# Patient Record
Sex: Female | Born: 1935 | Race: White | Hispanic: No | Smoking: Never smoker
Health system: Southern US, Community
[De-identification: ages and names within clinical notes are randomized; demographics above are authoritative.]

## PROBLEM LIST (undated history)

## (undated) DIAGNOSIS — J069 Acute upper respiratory infection, unspecified: Secondary | ICD-10-CM

## (undated) DIAGNOSIS — R413 Other amnesia: Secondary | ICD-10-CM

## (undated) DIAGNOSIS — I10 Essential (primary) hypertension: Secondary | ICD-10-CM

## (undated) DIAGNOSIS — D649 Anemia, unspecified: Secondary | ICD-10-CM

## (undated) DIAGNOSIS — M1990 Unspecified osteoarthritis, unspecified site: Secondary | ICD-10-CM

## (undated) DIAGNOSIS — J189 Pneumonia, unspecified organism: Secondary | ICD-10-CM

## (undated) DIAGNOSIS — F0390 Unspecified dementia without behavioral disturbance: Secondary | ICD-10-CM

## (undated) HISTORY — DX: Acute upper respiratory infection, unspecified: J06.9

## (undated) HISTORY — PX: OTHER SURGICAL HISTORY: SHX169

## (undated) HISTORY — DX: Other amnesia: R41.3

## (undated) HISTORY — DX: Essential (primary) hypertension: I10

## (undated) HISTORY — DX: Anemia, unspecified: D64.9

---

## 2005-04-25 ENCOUNTER — Emergency Department (HOSPITAL_COMMUNITY): Admission: EM | Admit: 2005-04-25 | Discharge: 2005-04-25 | Payer: Self-pay | Admitting: Emergency Medicine

## 2010-07-15 ENCOUNTER — Encounter
Admission: RE | Admit: 2010-07-15 | Discharge: 2010-10-13 | Payer: Self-pay | Source: Home / Self Care | Admitting: Family Medicine

## 2011-03-09 ENCOUNTER — Ambulatory Visit: Payer: Medicare Other | Attending: Family Medicine | Admitting: Physical Therapy

## 2011-03-09 DIAGNOSIS — M6281 Muscle weakness (generalized): Secondary | ICD-10-CM | POA: Insufficient documentation

## 2011-03-09 DIAGNOSIS — IMO0001 Reserved for inherently not codable concepts without codable children: Secondary | ICD-10-CM | POA: Insufficient documentation

## 2011-03-09 DIAGNOSIS — R269 Unspecified abnormalities of gait and mobility: Secondary | ICD-10-CM | POA: Insufficient documentation

## 2011-03-09 DIAGNOSIS — R5381 Other malaise: Secondary | ICD-10-CM | POA: Insufficient documentation

## 2011-03-11 ENCOUNTER — Ambulatory Visit: Payer: Medicare Other | Admitting: Physical Therapy

## 2011-03-18 ENCOUNTER — Ambulatory Visit: Payer: Medicare Other | Admitting: Physical Therapy

## 2011-03-23 ENCOUNTER — Ambulatory Visit: Payer: Medicare Other | Admitting: Physical Therapy

## 2011-03-25 ENCOUNTER — Ambulatory Visit: Payer: Medicare Other | Admitting: Physical Therapy

## 2011-03-30 ENCOUNTER — Ambulatory Visit: Payer: Medicare Other | Admitting: Physical Therapy

## 2011-04-01 ENCOUNTER — Ambulatory Visit: Payer: Medicare Other | Admitting: Physical Therapy

## 2011-04-02 ENCOUNTER — Encounter: Payer: Self-pay | Admitting: Nurse Practitioner

## 2011-04-06 ENCOUNTER — Encounter: Payer: Self-pay | Admitting: Nurse Practitioner

## 2011-04-06 ENCOUNTER — Ambulatory Visit: Payer: Medicare Other | Admitting: Physical Therapy

## 2011-04-08 ENCOUNTER — Ambulatory Visit: Payer: Medicare Other | Admitting: Physical Therapy

## 2011-04-13 ENCOUNTER — Ambulatory Visit: Payer: Medicare Other | Attending: Family Medicine | Admitting: Physical Therapy

## 2011-04-13 DIAGNOSIS — M6281 Muscle weakness (generalized): Secondary | ICD-10-CM | POA: Insufficient documentation

## 2011-04-13 DIAGNOSIS — IMO0001 Reserved for inherently not codable concepts without codable children: Secondary | ICD-10-CM | POA: Insufficient documentation

## 2011-04-13 DIAGNOSIS — R269 Unspecified abnormalities of gait and mobility: Secondary | ICD-10-CM | POA: Insufficient documentation

## 2011-04-13 DIAGNOSIS — R5381 Other malaise: Secondary | ICD-10-CM | POA: Insufficient documentation

## 2011-04-15 ENCOUNTER — Ambulatory Visit: Payer: Medicare Other | Admitting: Physical Therapy

## 2011-04-20 ENCOUNTER — Ambulatory Visit: Payer: Medicare Other | Admitting: Physical Therapy

## 2011-04-22 ENCOUNTER — Ambulatory Visit: Payer: Medicare Other | Admitting: Physical Therapy

## 2011-04-27 ENCOUNTER — Ambulatory Visit: Payer: Medicare Other | Admitting: Physical Therapy

## 2011-04-29 ENCOUNTER — Encounter: Payer: Medicare Other | Admitting: Physical Therapy

## 2011-05-04 ENCOUNTER — Ambulatory Visit: Payer: Medicare Other | Admitting: Physical Therapy

## 2011-05-06 ENCOUNTER — Encounter: Payer: Medicare Other | Admitting: Physical Therapy

## 2011-05-11 ENCOUNTER — Ambulatory Visit: Payer: Medicare Other | Attending: Family Medicine | Admitting: Physical Therapy

## 2011-05-11 DIAGNOSIS — IMO0001 Reserved for inherently not codable concepts without codable children: Secondary | ICD-10-CM | POA: Insufficient documentation

## 2011-05-11 DIAGNOSIS — M6281 Muscle weakness (generalized): Secondary | ICD-10-CM | POA: Insufficient documentation

## 2011-05-11 DIAGNOSIS — R269 Unspecified abnormalities of gait and mobility: Secondary | ICD-10-CM | POA: Insufficient documentation

## 2011-05-11 DIAGNOSIS — R5381 Other malaise: Secondary | ICD-10-CM | POA: Insufficient documentation

## 2011-05-13 ENCOUNTER — Ambulatory Visit: Payer: Medicare Other | Admitting: Physical Therapy

## 2011-05-18 ENCOUNTER — Ambulatory Visit: Payer: Medicare Other | Admitting: *Deleted

## 2011-05-25 ENCOUNTER — Encounter: Payer: Medicare Other | Admitting: Physical Therapy

## 2011-05-27 ENCOUNTER — Ambulatory Visit: Payer: Medicare Other | Admitting: Physical Therapy

## 2011-06-01 ENCOUNTER — Ambulatory Visit: Payer: Medicare Other | Admitting: Physical Therapy

## 2011-06-08 ENCOUNTER — Ambulatory Visit: Payer: Medicare Other | Attending: Family Medicine | Admitting: Physical Therapy

## 2011-06-08 DIAGNOSIS — IMO0001 Reserved for inherently not codable concepts without codable children: Secondary | ICD-10-CM | POA: Insufficient documentation

## 2011-06-08 DIAGNOSIS — M6281 Muscle weakness (generalized): Secondary | ICD-10-CM | POA: Insufficient documentation

## 2011-06-08 DIAGNOSIS — R5381 Other malaise: Secondary | ICD-10-CM | POA: Insufficient documentation

## 2011-06-08 DIAGNOSIS — R269 Unspecified abnormalities of gait and mobility: Secondary | ICD-10-CM | POA: Insufficient documentation

## 2011-06-10 ENCOUNTER — Ambulatory Visit: Payer: Medicare Other | Attending: Family Medicine | Admitting: Physical Therapy

## 2011-06-10 DIAGNOSIS — R5381 Other malaise: Secondary | ICD-10-CM | POA: Insufficient documentation

## 2011-06-10 DIAGNOSIS — R269 Unspecified abnormalities of gait and mobility: Secondary | ICD-10-CM | POA: Insufficient documentation

## 2011-06-10 DIAGNOSIS — IMO0001 Reserved for inherently not codable concepts without codable children: Secondary | ICD-10-CM | POA: Insufficient documentation

## 2011-06-10 DIAGNOSIS — M6281 Muscle weakness (generalized): Secondary | ICD-10-CM | POA: Insufficient documentation

## 2011-06-15 ENCOUNTER — Ambulatory Visit: Payer: Medicare Other | Admitting: Physical Therapy

## 2011-06-17 ENCOUNTER — Ambulatory Visit: Payer: Medicare Other | Admitting: Physical Therapy

## 2011-06-21 ENCOUNTER — Ambulatory Visit: Payer: Medicare Other | Admitting: Physical Therapy

## 2011-06-23 ENCOUNTER — Ambulatory Visit: Payer: Medicare Other | Admitting: Physical Therapy

## 2011-06-28 ENCOUNTER — Ambulatory Visit: Payer: Medicare Other | Admitting: Physical Therapy

## 2011-07-01 ENCOUNTER — Ambulatory Visit: Payer: Medicare Other | Admitting: Physical Therapy

## 2011-07-05 ENCOUNTER — Ambulatory Visit: Payer: Medicare Other | Admitting: Physical Therapy

## 2011-07-08 ENCOUNTER — Ambulatory Visit: Payer: Medicare Other | Admitting: *Deleted

## 2011-11-26 DIAGNOSIS — T8690 Unspecified complication of unspecified transplanted organ and tissue: Secondary | ICD-10-CM | POA: Diagnosis not present

## 2011-11-26 DIAGNOSIS — H4050X0 Glaucoma secondary to other eye disorders, unspecified eye, stage unspecified: Secondary | ICD-10-CM | POA: Diagnosis not present

## 2011-11-26 DIAGNOSIS — Z09 Encounter for follow-up examination after completed treatment for conditions other than malignant neoplasm: Secondary | ICD-10-CM | POA: Diagnosis not present

## 2011-12-17 DIAGNOSIS — N390 Urinary tract infection, site not specified: Secondary | ICD-10-CM | POA: Diagnosis not present

## 2012-01-05 DIAGNOSIS — H4050X0 Glaucoma secondary to other eye disorders, unspecified eye, stage unspecified: Secondary | ICD-10-CM | POA: Diagnosis not present

## 2012-01-27 DIAGNOSIS — H4050X0 Glaucoma secondary to other eye disorders, unspecified eye, stage unspecified: Secondary | ICD-10-CM | POA: Diagnosis not present

## 2012-01-27 DIAGNOSIS — H409 Unspecified glaucoma: Secondary | ICD-10-CM | POA: Diagnosis not present

## 2012-01-27 DIAGNOSIS — Z9889 Other specified postprocedural states: Secondary | ICD-10-CM | POA: Diagnosis not present

## 2012-01-27 DIAGNOSIS — T8690 Unspecified complication of unspecified transplanted organ and tissue: Secondary | ICD-10-CM | POA: Diagnosis not present

## 2012-02-17 DIAGNOSIS — Z79899 Other long term (current) drug therapy: Secondary | ICD-10-CM | POA: Diagnosis not present

## 2012-02-17 DIAGNOSIS — Z9849 Cataract extraction status, unspecified eye: Secondary | ICD-10-CM | POA: Diagnosis not present

## 2012-02-17 DIAGNOSIS — R9431 Abnormal electrocardiogram [ECG] [EKG]: Secondary | ICD-10-CM | POA: Diagnosis not present

## 2012-02-17 DIAGNOSIS — I209 Angina pectoris, unspecified: Secondary | ICD-10-CM | POA: Diagnosis not present

## 2012-02-17 DIAGNOSIS — H4011X0 Primary open-angle glaucoma, stage unspecified: Secondary | ICD-10-CM | POA: Diagnosis not present

## 2012-02-17 DIAGNOSIS — Z881 Allergy status to other antibiotic agents status: Secondary | ICD-10-CM | POA: Diagnosis not present

## 2012-02-17 DIAGNOSIS — H409 Unspecified glaucoma: Secondary | ICD-10-CM | POA: Diagnosis not present

## 2012-02-17 DIAGNOSIS — H3530 Unspecified macular degeneration: Secondary | ICD-10-CM | POA: Diagnosis not present

## 2012-02-17 DIAGNOSIS — T85398A Other mechanical complication of other ocular prosthetic devices, implants and grafts, initial encounter: Secondary | ICD-10-CM | POA: Diagnosis not present

## 2012-02-22 DIAGNOSIS — I209 Angina pectoris, unspecified: Secondary | ICD-10-CM | POA: Diagnosis not present

## 2012-02-22 DIAGNOSIS — H409 Unspecified glaucoma: Secondary | ICD-10-CM | POA: Diagnosis not present

## 2012-02-22 DIAGNOSIS — Z881 Allergy status to other antibiotic agents status: Secondary | ICD-10-CM | POA: Diagnosis not present

## 2012-02-22 DIAGNOSIS — H3530 Unspecified macular degeneration: Secondary | ICD-10-CM | POA: Diagnosis not present

## 2012-02-22 DIAGNOSIS — T85398A Other mechanical complication of other ocular prosthetic devices, implants and grafts, initial encounter: Secondary | ICD-10-CM | POA: Diagnosis not present

## 2012-02-22 DIAGNOSIS — H4011X0 Primary open-angle glaucoma, stage unspecified: Secondary | ICD-10-CM | POA: Diagnosis not present

## 2012-02-23 DIAGNOSIS — Z881 Allergy status to other antibiotic agents status: Secondary | ICD-10-CM | POA: Diagnosis not present

## 2012-02-23 DIAGNOSIS — T85398A Other mechanical complication of other ocular prosthetic devices, implants and grafts, initial encounter: Secondary | ICD-10-CM | POA: Diagnosis not present

## 2012-02-23 DIAGNOSIS — H3530 Unspecified macular degeneration: Secondary | ICD-10-CM | POA: Diagnosis not present

## 2012-02-23 DIAGNOSIS — H409 Unspecified glaucoma: Secondary | ICD-10-CM | POA: Diagnosis not present

## 2012-02-23 DIAGNOSIS — I209 Angina pectoris, unspecified: Secondary | ICD-10-CM | POA: Diagnosis not present

## 2012-02-23 DIAGNOSIS — H4011X0 Primary open-angle glaucoma, stage unspecified: Secondary | ICD-10-CM | POA: Diagnosis not present

## 2012-03-02 DIAGNOSIS — N390 Urinary tract infection, site not specified: Secondary | ICD-10-CM | POA: Diagnosis not present

## 2012-03-08 DIAGNOSIS — H4050X0 Glaucoma secondary to other eye disorders, unspecified eye, stage unspecified: Secondary | ICD-10-CM | POA: Diagnosis not present

## 2012-03-22 DIAGNOSIS — I7090 Unspecified atherosclerosis: Secondary | ICD-10-CM | POA: Diagnosis not present

## 2012-03-22 DIAGNOSIS — H4050X0 Glaucoma secondary to other eye disorders, unspecified eye, stage unspecified: Secondary | ICD-10-CM | POA: Diagnosis not present

## 2012-03-22 DIAGNOSIS — I10 Essential (primary) hypertension: Secondary | ICD-10-CM | POA: Diagnosis not present

## 2012-04-26 DIAGNOSIS — H4050X0 Glaucoma secondary to other eye disorders, unspecified eye, stage unspecified: Secondary | ICD-10-CM | POA: Diagnosis not present

## 2012-04-28 DIAGNOSIS — N390 Urinary tract infection, site not specified: Secondary | ICD-10-CM | POA: Diagnosis not present

## 2012-04-28 DIAGNOSIS — E785 Hyperlipidemia, unspecified: Secondary | ICD-10-CM | POA: Diagnosis not present

## 2012-04-28 DIAGNOSIS — I10 Essential (primary) hypertension: Secondary | ICD-10-CM | POA: Diagnosis not present

## 2012-05-15 DIAGNOSIS — N390 Urinary tract infection, site not specified: Secondary | ICD-10-CM | POA: Diagnosis not present

## 2012-07-14 DIAGNOSIS — T85398A Other mechanical complication of other ocular prosthetic devices, implants and grafts, initial encounter: Secondary | ICD-10-CM | POA: Diagnosis not present

## 2012-07-14 DIAGNOSIS — Z947 Corneal transplant status: Secondary | ICD-10-CM | POA: Diagnosis not present

## 2012-07-31 DIAGNOSIS — N390 Urinary tract infection, site not specified: Secondary | ICD-10-CM | POA: Diagnosis not present

## 2012-07-31 DIAGNOSIS — S20219A Contusion of unspecified front wall of thorax, initial encounter: Secondary | ICD-10-CM | POA: Diagnosis not present

## 2012-07-31 DIAGNOSIS — S2239XA Fracture of one rib, unspecified side, initial encounter for closed fracture: Secondary | ICD-10-CM | POA: Diagnosis not present

## 2012-08-07 DIAGNOSIS — S2239XA Fracture of one rib, unspecified side, initial encounter for closed fracture: Secondary | ICD-10-CM | POA: Diagnosis not present

## 2012-08-08 DIAGNOSIS — M25539 Pain in unspecified wrist: Secondary | ICD-10-CM | POA: Diagnosis not present

## 2012-08-08 DIAGNOSIS — S59909A Unspecified injury of unspecified elbow, initial encounter: Secondary | ICD-10-CM | POA: Diagnosis not present

## 2012-08-08 DIAGNOSIS — M25439 Effusion, unspecified wrist: Secondary | ICD-10-CM | POA: Diagnosis not present

## 2012-08-08 DIAGNOSIS — M19039 Primary osteoarthritis, unspecified wrist: Secondary | ICD-10-CM | POA: Diagnosis not present

## 2012-08-08 DIAGNOSIS — S6990XA Unspecified injury of unspecified wrist, hand and finger(s), initial encounter: Secondary | ICD-10-CM | POA: Diagnosis not present

## 2012-08-08 DIAGNOSIS — R4182 Altered mental status, unspecified: Secondary | ICD-10-CM | POA: Diagnosis not present

## 2012-08-08 DIAGNOSIS — M11239 Other chondrocalcinosis, unspecified wrist: Secondary | ICD-10-CM | POA: Diagnosis not present

## 2012-08-08 DIAGNOSIS — M948X9 Other specified disorders of cartilage, unspecified sites: Secondary | ICD-10-CM | POA: Diagnosis not present

## 2012-08-08 DIAGNOSIS — S0990XA Unspecified injury of head, initial encounter: Secondary | ICD-10-CM | POA: Diagnosis not present

## 2012-08-10 DIAGNOSIS — M19039 Primary osteoarthritis, unspecified wrist: Secondary | ICD-10-CM | POA: Diagnosis not present

## 2012-09-04 DIAGNOSIS — N390 Urinary tract infection, site not specified: Secondary | ICD-10-CM | POA: Diagnosis not present

## 2012-09-04 DIAGNOSIS — I10 Essential (primary) hypertension: Secondary | ICD-10-CM | POA: Diagnosis not present

## 2012-09-04 DIAGNOSIS — S2239XA Fracture of one rib, unspecified side, initial encounter for closed fracture: Secondary | ICD-10-CM | POA: Diagnosis not present

## 2012-09-04 DIAGNOSIS — E785 Hyperlipidemia, unspecified: Secondary | ICD-10-CM | POA: Diagnosis not present

## 2012-09-04 DIAGNOSIS — Z23 Encounter for immunization: Secondary | ICD-10-CM | POA: Diagnosis not present

## 2012-09-06 DIAGNOSIS — M19039 Primary osteoarthritis, unspecified wrist: Secondary | ICD-10-CM | POA: Diagnosis not present

## 2012-09-07 DIAGNOSIS — H905 Unspecified sensorineural hearing loss: Secondary | ICD-10-CM | POA: Diagnosis not present

## 2012-10-26 DIAGNOSIS — H4050X0 Glaucoma secondary to other eye disorders, unspecified eye, stage unspecified: Secondary | ICD-10-CM | POA: Diagnosis not present

## 2012-10-26 DIAGNOSIS — T8690 Unspecified complication of unspecified transplanted organ and tissue: Secondary | ICD-10-CM | POA: Diagnosis not present

## 2012-12-01 DIAGNOSIS — R3919 Other difficulties with micturition: Secondary | ICD-10-CM | POA: Diagnosis not present

## 2012-12-01 DIAGNOSIS — F02818 Dementia in other diseases classified elsewhere, unspecified severity, with other behavioral disturbance: Secondary | ICD-10-CM | POA: Diagnosis not present

## 2012-12-01 DIAGNOSIS — R269 Unspecified abnormalities of gait and mobility: Secondary | ICD-10-CM | POA: Diagnosis not present

## 2012-12-01 DIAGNOSIS — I10 Essential (primary) hypertension: Secondary | ICD-10-CM | POA: Diagnosis not present

## 2012-12-12 ENCOUNTER — Ambulatory Visit: Payer: Medicare Other | Attending: Family Medicine | Admitting: Physical Therapy

## 2012-12-12 DIAGNOSIS — R269 Unspecified abnormalities of gait and mobility: Secondary | ICD-10-CM | POA: Insufficient documentation

## 2012-12-12 DIAGNOSIS — R279 Unspecified lack of coordination: Secondary | ICD-10-CM | POA: Diagnosis not present

## 2012-12-12 DIAGNOSIS — IMO0001 Reserved for inherently not codable concepts without codable children: Secondary | ICD-10-CM | POA: Insufficient documentation

## 2012-12-20 ENCOUNTER — Ambulatory Visit: Payer: Medicare Other | Admitting: Physical Therapy

## 2012-12-20 DIAGNOSIS — IMO0001 Reserved for inherently not codable concepts without codable children: Secondary | ICD-10-CM | POA: Diagnosis not present

## 2012-12-20 DIAGNOSIS — R269 Unspecified abnormalities of gait and mobility: Secondary | ICD-10-CM | POA: Diagnosis not present

## 2012-12-20 DIAGNOSIS — R279 Unspecified lack of coordination: Secondary | ICD-10-CM | POA: Diagnosis not present

## 2012-12-26 ENCOUNTER — Ambulatory Visit: Payer: Medicare Other | Admitting: Physical Therapy

## 2012-12-26 DIAGNOSIS — R279 Unspecified lack of coordination: Secondary | ICD-10-CM | POA: Diagnosis not present

## 2012-12-26 DIAGNOSIS — IMO0001 Reserved for inherently not codable concepts without codable children: Secondary | ICD-10-CM | POA: Diagnosis not present

## 2012-12-26 DIAGNOSIS — R269 Unspecified abnormalities of gait and mobility: Secondary | ICD-10-CM | POA: Diagnosis not present

## 2012-12-27 ENCOUNTER — Encounter: Payer: Medicare Other | Admitting: Physical Therapy

## 2013-01-03 ENCOUNTER — Ambulatory Visit: Payer: Medicare Other | Admitting: Physical Therapy

## 2013-01-03 DIAGNOSIS — IMO0001 Reserved for inherently not codable concepts without codable children: Secondary | ICD-10-CM | POA: Diagnosis not present

## 2013-01-03 DIAGNOSIS — R269 Unspecified abnormalities of gait and mobility: Secondary | ICD-10-CM | POA: Diagnosis not present

## 2013-01-03 DIAGNOSIS — R279 Unspecified lack of coordination: Secondary | ICD-10-CM | POA: Diagnosis not present

## 2013-01-09 ENCOUNTER — Encounter: Payer: Medicare Other | Admitting: Physical Therapy

## 2013-01-11 ENCOUNTER — Ambulatory Visit: Payer: Medicare Other | Attending: Family Medicine | Admitting: Physical Therapy

## 2013-01-11 DIAGNOSIS — R279 Unspecified lack of coordination: Secondary | ICD-10-CM | POA: Diagnosis not present

## 2013-01-11 DIAGNOSIS — R269 Unspecified abnormalities of gait and mobility: Secondary | ICD-10-CM | POA: Diagnosis not present

## 2013-01-11 DIAGNOSIS — IMO0001 Reserved for inherently not codable concepts without codable children: Secondary | ICD-10-CM | POA: Diagnosis not present

## 2013-01-17 ENCOUNTER — Ambulatory Visit: Payer: Medicare Other | Admitting: Physical Therapy

## 2013-01-17 DIAGNOSIS — R269 Unspecified abnormalities of gait and mobility: Secondary | ICD-10-CM | POA: Diagnosis not present

## 2013-01-17 DIAGNOSIS — R279 Unspecified lack of coordination: Secondary | ICD-10-CM | POA: Diagnosis not present

## 2013-01-17 DIAGNOSIS — IMO0001 Reserved for inherently not codable concepts without codable children: Secondary | ICD-10-CM | POA: Diagnosis not present

## 2013-01-22 ENCOUNTER — Encounter: Payer: Medicare Other | Admitting: Physical Therapy

## 2013-01-23 ENCOUNTER — Encounter: Payer: Medicare Other | Admitting: Physical Therapy

## 2013-01-24 ENCOUNTER — Ambulatory Visit: Payer: Medicare Other | Admitting: Physical Therapy

## 2013-01-24 DIAGNOSIS — R279 Unspecified lack of coordination: Secondary | ICD-10-CM | POA: Diagnosis not present

## 2013-01-24 DIAGNOSIS — IMO0001 Reserved for inherently not codable concepts without codable children: Secondary | ICD-10-CM | POA: Diagnosis not present

## 2013-01-24 DIAGNOSIS — R269 Unspecified abnormalities of gait and mobility: Secondary | ICD-10-CM | POA: Diagnosis not present

## 2013-01-25 DIAGNOSIS — H4050X0 Glaucoma secondary to other eye disorders, unspecified eye, stage unspecified: Secondary | ICD-10-CM | POA: Diagnosis not present

## 2013-01-25 DIAGNOSIS — Z947 Corneal transplant status: Secondary | ICD-10-CM | POA: Diagnosis not present

## 2013-01-29 ENCOUNTER — Ambulatory Visit (INDEPENDENT_AMBULATORY_CARE_PROVIDER_SITE_OTHER): Payer: Medicare Other | Admitting: Family Medicine

## 2013-01-29 ENCOUNTER — Encounter: Payer: Self-pay | Admitting: Family Medicine

## 2013-01-29 VITALS — BP 125/65 | HR 82 | Temp 97.4°F | Ht 62.0 in | Wt 129.4 lb

## 2013-01-29 DIAGNOSIS — H9193 Unspecified hearing loss, bilateral: Secondary | ICD-10-CM

## 2013-01-29 DIAGNOSIS — I2510 Atherosclerotic heart disease of native coronary artery without angina pectoris: Secondary | ICD-10-CM | POA: Diagnosis not present

## 2013-01-29 DIAGNOSIS — F0391 Unspecified dementia with behavioral disturbance: Secondary | ICD-10-CM

## 2013-01-29 DIAGNOSIS — H9190 Unspecified hearing loss, unspecified ear: Secondary | ICD-10-CM

## 2013-01-29 DIAGNOSIS — E785 Hyperlipidemia, unspecified: Secondary | ICD-10-CM | POA: Insufficient documentation

## 2013-01-29 DIAGNOSIS — R413 Other amnesia: Secondary | ICD-10-CM | POA: Insufficient documentation

## 2013-01-29 DIAGNOSIS — R209 Unspecified disturbances of skin sensation: Secondary | ICD-10-CM

## 2013-01-29 DIAGNOSIS — M8580 Other specified disorders of bone density and structure, unspecified site: Secondary | ICD-10-CM | POA: Insufficient documentation

## 2013-01-29 DIAGNOSIS — M949 Disorder of cartilage, unspecified: Secondary | ICD-10-CM

## 2013-01-29 DIAGNOSIS — I10 Essential (primary) hypertension: Secondary | ICD-10-CM | POA: Diagnosis not present

## 2013-01-29 DIAGNOSIS — R200 Anesthesia of skin: Secondary | ICD-10-CM

## 2013-01-29 DIAGNOSIS — R202 Paresthesia of skin: Secondary | ICD-10-CM

## 2013-01-29 DIAGNOSIS — F03918 Unspecified dementia, unspecified severity, with other behavioral disturbance: Secondary | ICD-10-CM | POA: Insufficient documentation

## 2013-01-29 DIAGNOSIS — T781XXA Other adverse food reactions, not elsewhere classified, initial encounter: Secondary | ICD-10-CM

## 2013-01-29 DIAGNOSIS — M899 Disorder of bone, unspecified: Secondary | ICD-10-CM

## 2013-01-29 NOTE — Progress Notes (Signed)
Subjective:     Patient ID: Sue Schaefer, female   DOB: Jul 25, 1920, 79 y.o.   MRN: [id number]  HPI 2 days ago had itching of the palms and tongue numb. Contradictory reports of the speech. One daughter says she her speech was slurred and the other said it was clear. The daughter that reported that patient's  speech was clear his more credible, and was present with the patient at the beginning of the episode. One of her daughters are prepared breakfast for her. Prepared pre-packaged potatoes. This was new to her diet. plus she had bacon, and eggs. The daughter will prepared breakfast for patient noted that it was really itching of the palm of the hand. No shortness of breath the tongue felt funny and was known and itchy. No headache no dizziness. No weakness in the extremities. No numbness in the extremities. Symptoms resolved with Benadryl one dose 4 hours after it was taken.  Past Medical History  Diagnosis Date  . Hypertension   . URI (upper respiratory infection)   . Anemia   . Memory loss   . Osteoporosis   . Vitamin D deficiency    No past surgical history on file. History   Social History  . Marital Status: Widowed    Spouse Name: N/A    Number of Children: N/A  . Years of Education: N/A   Occupational History  . Not on file.   Social History Main Topics  . Smoking status: Never Smoker   . Smokeless tobacco: Not on file  . Alcohol Use: No  . Drug Use: No  . Sexually Active: Not on file   Other Topics Concern  . Not on file   Social History Narrative  . No narrative on file   No family history on file. Current Outpatient Prescriptions on File Prior to Visit  Medication Sig Dispense Refill  . alendronate (FOSAMAX) 70 MG tablet Take 70 mg by mouth every 7 (seven) days. Take with a full glass of water on an empty stomach.       . Ascorbic Acid (VITAMIN C) 1000 MG tablet Take 1,000 mg by mouth daily.        Marland Kitchen aspirin 81 MG chewable tablet Chew 81 mg by mouth daily.         Marland Kitchen atenolol (TENORMIN) 25 MG tablet Take 25 mg by mouth daily.        . B Complex-C-Min-Fe-FA (HEMATINIC PLUS COMPLEX PO) Take by mouth 2 (two) times daily.        . Cholecalciferol (VITAMIN D3) 10000 UNITS capsule Take 10,000 Units by mouth daily.        Marland Kitchen donepezil (ARICEPT) 10 MG tablet Take 10 mg by mouth at bedtime as needed.        . furosemide (LASIX) 20 MG tablet Take 20 mg by mouth daily.        . Lutein 6 MG CAPS Take by mouth daily.        Marland Kitchen NIFEdipine (PROCARDIA-XL) 30 MG (OSM) 24 hr tablet Take 30 mg by mouth daily.        . nitrofurantoin, macrocrystal-monohydrate, (MACROBID) 100 MG capsule Take 100 mg by mouth daily.        . nitroGLYCERIN (NITROSTAT) 0.4 MG SL tablet Place 0.4 mg under the tongue every 5 (five) minutes as needed.        Marland Kitchen omega-3 acid ethyl esters (LOVAZA) 1 G capsule Take 1 g by mouth 3 (three) times daily.        Marland Kitchen  polycarbophil (FIBERCON) 625 MG tablet Take 625 mg by mouth 2 (two) times daily.        . vitamin E 400 UNIT capsule Take 400 Units by mouth daily.         No current facility-administered medications on file prior to visit.   Allergies  Allergen Reactions  . Keflex (Cephalexin)   . Penicillins Cross Reactors     Patient is allergic to ALL [facility] DRUGS!!!    There is no immunization history on file for this patient. Prior to Admission medications   Medication Sig Start Date End Date Taking? Authorizing Provider  alendronate (FOSAMAX) 70 MG tablet Take 70 mg by mouth every 7 (seven) days. Take with a full glass of water on an empty stomach.    Yes Historical Provider, MD  Ascorbic Acid (VITAMIN C) 1000 MG tablet Take 1,000 mg by mouth daily.     Yes Historical Provider, MD  aspirin 81 MG chewable tablet Chew 81 mg by mouth daily.     Yes Historical Provider, MD  atenolol (TENORMIN) 25 MG tablet Take 25 mg by mouth daily.     Yes Historical Provider, MD  B Complex-C-Min-Fe-FA (HEMATINIC PLUS COMPLEX PO) Take by mouth 2 (two) times daily.      Yes Historical Provider, MD  Cholecalciferol (VITAMIN D3) 10000 UNITS capsule Take 10,000 Units by mouth daily.     Yes Historical Provider, MD  donepezil (ARICEPT) 10 MG tablet Take 10 mg by mouth at bedtime as needed.     Yes Historical Provider, MD  furosemide (LASIX) 20 MG tablet Take 20 mg by mouth daily.     Yes Historical Provider, MD  Lutein 6 MG CAPS Take by mouth daily.     Yes Historical Provider, MD  NIFEdipine (PROCARDIA-XL) 30 MG (OSM) 24 hr tablet Take 30 mg by mouth daily.     Yes Historical Provider, MD  nitrofurantoin, macrocrystal-monohydrate, (MACROBID) 100 MG capsule Take 100 mg by mouth daily.     Yes Historical Provider, MD  nitroGLYCERIN (NITROSTAT) 0.4 MG SL tablet Place 0.4 mg under the tongue every 5 (five) minutes as needed.     Yes Historical Provider, MD  omega-3 acid ethyl esters (LOVAZA) 1 G capsule Take 1 g by mouth 3 (three) times daily.     Yes Historical Provider, MD  polycarbophil (FIBERCON) 625 MG tablet Take 625 mg by mouth 2 (two) times daily.     Yes Historical Provider, MD  vitamin E 400 UNIT capsule Take 400 Units by mouth daily.     Yes Historical Provider, MD     Review of Systems No other new symptoms    Objective:   Physical Exam On examination she appeared in good health and spirits. Well developed, well nourished. Vital signs as documented. BP 125/65  Pulse 82  Temp(Src) 97.4 F (36.3 C) (Oral)  Ht 5\' 2"  (1.575 m)  Wt 129 lb 6.4 oz (58.695 kg)  BMI 23.66 kg/m2  Skin warm and dry and without overt rashes. Head & Neck without JVD. No change in her physical findings and her orbital findings, was normal Lungs clear.  Heart exam notable for regular rhythm, normal sounds and absence of murmurs, rubs or gallops. Abdomen unremarkable and without evidence of organomegaly, masses, or abdominal aortic enlargement.  Breast exam: not performed. Gyn Exam: Not performed. External Genitalia: Vagina:: Cervix: Uterus: Adnexae: R/V: Extremities  nonedematous. Neurologically, the patient was awake, alert, and oriented to person, place . There were no  obvious focal neurologic abnormalities.    Assessment:     Tingling sensation - Plan: EKG 12-Lead  Numbness of tongue - Plan: EKG 12-Lead  Memory impairment  Dementia with behavioral disturbance  Unspecified essential hypertension  Other and unspecified hyperlipidemia  Hearing loss, bilateral  Coronary artery disease  Osteopenia  Food allergy, initial encounter   Plan:     No results found for this or any previous visit.                          Medications prescribed: OTC Benadryl I do not believe that this had anything to do with any neurologic event. A history chronological age following that timeframe of ingesting a new or different food producing allergy-like symptoms which resolved 4 hours after ingestion of Benadryl. This is consistent with food allergy reaction discussed with the daughter who prepares her breakfast half steps to take to avoid a repeat event no neurologic imaging was necessary. No other intervention needed. Patient to have Benadryl on hand in case a repeat of symptoms. Routine follow up in 3 months.     Rashaan Wyles P. Modesto Charon, M.D.

## 2013-01-29 NOTE — Progress Notes (Signed)
Subjective:     Patient ID: Sue Schaefer, female   DOB: 09-Jul-1920, 79 y.o.   MRN: [id number]  HPI   Review of Systems     Objective:   Physical Exam     Assessment:        Plan:     [facility] reading (PRIMARY) by  Dr. Leodis Sias: EKG performed in the clinic today was normal. Patient and 2 daughters informed.

## 2013-02-07 ENCOUNTER — Ambulatory Visit: Payer: Medicare Other | Attending: Family Medicine | Admitting: *Deleted

## 2013-02-07 DIAGNOSIS — R269 Unspecified abnormalities of gait and mobility: Secondary | ICD-10-CM | POA: Insufficient documentation

## 2013-02-07 DIAGNOSIS — R279 Unspecified lack of coordination: Secondary | ICD-10-CM | POA: Insufficient documentation

## 2013-02-07 DIAGNOSIS — IMO0001 Reserved for inherently not codable concepts without codable children: Secondary | ICD-10-CM | POA: Diagnosis not present

## 2013-02-08 ENCOUNTER — Other Ambulatory Visit: Payer: Self-pay | Admitting: Family Medicine

## 2013-02-14 ENCOUNTER — Ambulatory Visit: Payer: Medicare Other | Admitting: Physical Therapy

## 2013-02-19 DIAGNOSIS — T8690 Unspecified complication of unspecified transplanted organ and tissue: Secondary | ICD-10-CM | POA: Diagnosis not present

## 2013-02-19 DIAGNOSIS — T85398A Other mechanical complication of other ocular prosthetic devices, implants and grafts, initial encounter: Secondary | ICD-10-CM | POA: Diagnosis not present

## 2013-02-19 DIAGNOSIS — Z9889 Other specified postprocedural states: Secondary | ICD-10-CM | POA: Diagnosis not present

## 2013-02-21 ENCOUNTER — Encounter: Payer: Medicare Other | Admitting: Physical Therapy

## 2013-02-28 ENCOUNTER — Ambulatory Visit: Payer: Medicare Other | Admitting: *Deleted

## 2013-02-28 ENCOUNTER — Other Ambulatory Visit: Payer: Self-pay | Admitting: *Deleted

## 2013-02-28 MED ORDER — MIRABEGRON ER 50 MG PO TB24: 50.0000 mg | ORAL_TABLET | Freq: Every day | ORAL | Status: DC

## 2013-03-03 ENCOUNTER — Other Ambulatory Visit: Payer: Self-pay | Admitting: Family Medicine

## 2013-03-07 ENCOUNTER — Ambulatory Visit: Payer: Medicare Other | Admitting: *Deleted

## 2013-03-08 ENCOUNTER — Other Ambulatory Visit: Payer: Self-pay | Admitting: Obstetrics & Gynecology

## 2013-03-08 MED ORDER — MIRABEGRON ER 50 MG PO TB24: 50.0000 mg | ORAL_TABLET | Freq: Every day | ORAL | Status: DC

## 2013-03-15 ENCOUNTER — Ambulatory Visit: Payer: Medicare Other | Attending: Family Medicine | Admitting: Physical Therapy

## 2013-03-15 DIAGNOSIS — R279 Unspecified lack of coordination: Secondary | ICD-10-CM | POA: Diagnosis not present

## 2013-03-15 DIAGNOSIS — R269 Unspecified abnormalities of gait and mobility: Secondary | ICD-10-CM | POA: Insufficient documentation

## 2013-03-15 DIAGNOSIS — IMO0001 Reserved for inherently not codable concepts without codable children: Secondary | ICD-10-CM | POA: Insufficient documentation

## 2013-03-21 ENCOUNTER — Encounter: Payer: Medicare Other | Admitting: Physical Therapy

## 2013-03-21 DIAGNOSIS — I10 Essential (primary) hypertension: Secondary | ICD-10-CM | POA: Diagnosis not present

## 2013-03-21 DIAGNOSIS — I7090 Unspecified atherosclerosis: Secondary | ICD-10-CM | POA: Diagnosis not present

## 2013-03-21 DIAGNOSIS — T8690 Unspecified complication of unspecified transplanted organ and tissue: Secondary | ICD-10-CM | POA: Diagnosis not present

## 2013-03-21 DIAGNOSIS — Z947 Corneal transplant status: Secondary | ICD-10-CM | POA: Diagnosis not present

## 2013-03-28 ENCOUNTER — Ambulatory Visit: Payer: Medicare Other | Admitting: Physical Therapy

## 2013-03-28 DIAGNOSIS — R269 Unspecified abnormalities of gait and mobility: Secondary | ICD-10-CM | POA: Diagnosis not present

## 2013-03-28 DIAGNOSIS — R279 Unspecified lack of coordination: Secondary | ICD-10-CM | POA: Diagnosis not present

## 2013-03-28 DIAGNOSIS — IMO0001 Reserved for inherently not codable concepts without codable children: Secondary | ICD-10-CM | POA: Diagnosis not present

## 2013-03-30 ENCOUNTER — Other Ambulatory Visit: Payer: Self-pay | Admitting: Family Medicine

## 2013-04-03 ENCOUNTER — Ambulatory Visit (INDEPENDENT_AMBULATORY_CARE_PROVIDER_SITE_OTHER): Payer: Medicare Other | Admitting: Family Medicine

## 2013-04-03 ENCOUNTER — Ambulatory Visit (INDEPENDENT_AMBULATORY_CARE_PROVIDER_SITE_OTHER): Payer: Medicare Other

## 2013-04-03 ENCOUNTER — Telehealth: Payer: Self-pay | Admitting: Family Medicine

## 2013-04-03 ENCOUNTER — Encounter: Payer: Self-pay | Admitting: Family Medicine

## 2013-04-03 VITALS — BP 121/61 | HR 72 | Temp 97.1°F | Wt 126.4 lb

## 2013-04-03 DIAGNOSIS — S60211A Contusion of right wrist, initial encounter: Secondary | ICD-10-CM

## 2013-04-03 DIAGNOSIS — I25810 Atherosclerosis of coronary artery bypass graft(s) without angina pectoris: Secondary | ICD-10-CM | POA: Insufficient documentation

## 2013-04-03 DIAGNOSIS — E785 Hyperlipidemia, unspecified: Secondary | ICD-10-CM | POA: Diagnosis not present

## 2013-04-03 DIAGNOSIS — E559 Vitamin D deficiency, unspecified: Secondary | ICD-10-CM | POA: Diagnosis not present

## 2013-04-03 DIAGNOSIS — S60219A Contusion of unspecified wrist, initial encounter: Secondary | ICD-10-CM | POA: Diagnosis not present

## 2013-04-03 DIAGNOSIS — R350 Frequency of micturition: Secondary | ICD-10-CM | POA: Diagnosis not present

## 2013-04-03 DIAGNOSIS — R5383 Other fatigue: Secondary | ICD-10-CM | POA: Diagnosis not present

## 2013-04-03 DIAGNOSIS — R5381 Other malaise: Secondary | ICD-10-CM | POA: Diagnosis not present

## 2013-04-03 DIAGNOSIS — S62609A Fracture of unspecified phalanx of unspecified finger, initial encounter for closed fracture: Secondary | ICD-10-CM

## 2013-04-03 DIAGNOSIS — S62606A Fracture of unspecified phalanx of right little finger, initial encounter for closed fracture: Secondary | ICD-10-CM | POA: Insufficient documentation

## 2013-04-03 DIAGNOSIS — IMO0001 Reserved for inherently not codable concepts without codable children: Secondary | ICD-10-CM | POA: Insufficient documentation

## 2013-04-03 DIAGNOSIS — Z79899 Other long term (current) drug therapy: Secondary | ICD-10-CM | POA: Insufficient documentation

## 2013-04-03 DIAGNOSIS — I10 Essential (primary) hypertension: Secondary | ICD-10-CM | POA: Diagnosis not present

## 2013-04-03 LAB — POCT CBC
Granulocyte percent: 68.6 %G (ref 37–80)
HCT, POC: 39.6 % (ref 37.7–47.9)
Hemoglobin: 13.5 g/dL (ref 12.2–16.2)
Lymph, poc: 1.5 (ref 0.6–3.4)
MCH, POC: 29.1 pg (ref 27–31.2)
MCHC: 34.2 g/dL (ref 31.8–35.4)
MCV: 85.2 fL (ref 80–97)
MPV: 8.5 fL (ref 0–99.8)
POC Granulocyte: 4.4 (ref 2–6.9)
POC LYMPH PERCENT: 24 %L (ref 10–50)
Platelet Count, POC: 169 10*3/uL (ref 142–424)
RBC: 4.7 M/uL (ref 4.04–5.48)
RDW, POC: 13.8 %
WBC: 6.4 10*3/uL (ref 4.6–10.2)

## 2013-04-03 LAB — COMPREHENSIVE METABOLIC PANEL
ALT: 22 U/L (ref 0–35)
AST: 30 U/L (ref 0–37)
Albumin: 3.8 g/dL (ref 3.5–5.2)
Alkaline Phosphatase: 63 U/L (ref 39–117)
BUN: 27 mg/dL — ABNORMAL HIGH (ref 6–23)
CO2: 30 mEq/L (ref 19–32)
Calcium: 9.6 mg/dL (ref 8.4–10.5)
Chloride: 102 mEq/L (ref 96–112)
Creat: 1.19 mg/dL — ABNORMAL HIGH (ref 0.50–1.10)
Glucose, Bld: 95 mg/dL (ref 70–99)
Potassium: 5 mEq/L (ref 3.5–5.3)
Sodium: 140 mEq/L (ref 135–145)
Total Bilirubin: 0.6 mg/dL (ref 0.3–1.2)
Total Protein: 6.6 g/dL (ref 6.0–8.3)

## 2013-04-03 LAB — POCT UA - MICROSCOPIC ONLY
Crystals, Ur, HPF, POC: NEGATIVE
RBC, urine, microscopic: NEGATIVE
Yeast, UA: NEGATIVE

## 2013-04-03 LAB — POCT URINALYSIS DIPSTICK
Bilirubin, UA: NEGATIVE
Blood, UA: NEGATIVE
Glucose, UA: NEGATIVE
Ketones, UA: NEGATIVE
Nitrite, UA: NEGATIVE
Protein, UA: NEGATIVE
Spec Grav, UA: 1.02
Urobilinogen, UA: NEGATIVE
pH, UA: 6.5

## 2013-04-03 NOTE — Progress Notes (Signed)
Patient ID: Sue Schaefer, female   DOB: Feb 02, 1920, 79 y.o.   MRN: [id number] SUBJECTIVE: HPI: Patient accompanied by 2 daughters -Sue Schaefer and Sue Schaefer. Has problems with balance supposed to use her walker and  Refuses all the time. She has fallen several times in the past.  Saw cardiologist and was cleared. Works in the yard. Uses her 2 canse but doesn't use her walker. Fortunately, she hasn't fallen since the last visit. Takes 2 stool softeners per day. She orders these products  fromcatalogues and they are delivered. She then gets several very watery stools. Sue Schaefer attests to this. Patient denies it adamantly. These products are part of patient's difficult behaviour. She is fixated with certain things such as having laxatives for BM.  Prune juice daily most days.  PMH/PSH: reviewed/updated in Epic  SH/FH: reviewed/updated in Epic  Allergies: reviewed/updated in Epic  Medications: reviewed/updated in Epic  Immunizations: reviewed/updated in Epic  ROS: As above in the HPI. All other systems are stable or negative.  OBJECTIVE: APPEARANCE:  Patient in no acute distress.The patient appeared well nourished and normally developed. Acyanotic. Waist: VITAL SIGNS:BP 121/61  Pulse 72  Temp(Src) 97.1 F (36.2 C) (Oral)  Wt 126 lb 6.4 oz (57.335 kg)  BMI 23.11 kg/m2   SKIN: warm and  Dry without overt rashes, tattoos and scars  HEAD and Neck: without JVD, Head and scalp: normal Eyes:no changes in the abnormality. Ears: Auricle normal, canal normal, Tympanic membranes normal, insufflation normal. Nose: normal Throat: normal Neck & thyroid: normal  CHEST & LUNGS: Chest wall: normal Lungs: Clear  CVS: Reveals the PMI to be normally located. Regular rhythm, First and Second Heart sounds are normal,  absence of murmurs, rubs or gallops. Peripheral vasculature: Radial pulses: normal Dorsal pedis pulses: normal Posterior pulses: normal  ABDOMEN:  Appearance: normal Benign,, no  organomegaly, no masses, no Abdominal Aortic enlargement. No Guarding , no rebound. No Bruits. Bowel sounds: normal  RECTAL: N/A GU: N/A  EXTREMETIES: nonedematous. Both Femoral and Pedal pulses are normal.  MUSCULOSKELETAL:  Spine:mild kyphosis There are deformities from her OA of her hands , fingers wrists and knees.  NEUROLOGIC: oriented to,place and personStrength is normal Gait is unstable and patient would be  Best served by using her walker. Patient refuses.  On leaving the clinic patient was walking unsteadly and in a hurry and tripped over her own cane. She fell 6 feet away from me and landed softly on her abdomen and forwardly flat hands.. No LOC. She was evaluated  And Xray of her right wrist was ordered due to swelling.of the anterior wrist.  [facility] reading (PRIMARY) by  Dr.Carlie Solorzano. Possible undisplaced Fracture of the base of the 5 th distal phalanx. Severe OA of the joints of the hand and fingers.  Patient was placed in the room again re-evaluated and observed and ice pack given A discussion to reinforce the need for walker  Use.                  ASSESSMENT: Encounter for long-term (current) use of other medications - Plan: Comprehensive metabolic panel  Essential hypertension, benign - Plan: Comprehensive metabolic panel  Other and unspecified hyperlipidemia - Plan: NMR Lipoprofile with Lipids  CAD (coronary artery disease) of artery bypass graft - Plan: Vitamin D 25 hydroxy  Other malaise and fatigue - Plan: POCT CBC  Frequency - Plan: POCT urinalysis dipstick, POCT UA - Microscopic Only, Urine culture  Contusion, wrist, right, initial encounter - Plan: DG Wrist Complete Right  Fracture of phalanx of right little finger, closed, initial encounter Sue Schaefer has been instructed to monitor and consider supervisoing her mom because patient is a risk for falling Consideration for placement.  Suspected laxative abuse  PLAN: Recommend Tai Chi at [facility] For  balance. Continue PT. A written Rx was given for PT. After patient fell in the Clinic . Emphasis is on fall prevention and need for compliance.  Orders Placed This Encounter  Procedures  . Urine culture  . DG Wrist Complete Right    Standing Status: Future     Number of Occurrences: 1     Standing Expiration Date: 06/03/2014    Order Specific Question:  Reason for Exam (SYMPTOM  OR DIAGNOSIS REQUIRED)    Answer:  fell and right wrist swelling    Order Specific Question:  Preferred imaging location?    Answer:  Internal  . Comprehensive metabolic panel  . NMR Lipoprofile with Lipids  . Vitamin D 25 hydroxy  . Ambulatory referral to Orthopedic Surgery    Referral Priority:  Routine    Referral Type:  Surgical    Referral Reason:  Specialty Services Required    Requested Specialty:  Orthopedic Surgery    Number of Visits Requested:  1  . POCT CBC  . POCT urinalysis dipstick  . POCT UA - Microscopic Only   Results for orders placed in visit on 04/03/13  POCT CBC      Result Value Range   WBC 6.4  4.6 - 10.2 K/uL   Lymph, poc 1.5  0.6 - 3.4   POC LYMPH PERCENT 24.0  10 - 50 %L   POC Granulocyte 4.4  2 - 6.9   Granulocyte percent 68.6  37 - 80 %G   RBC 4.7  4.04 - 5.48 M/uL   Hemoglobin 13.5  12.2 - 16.2 g/dL   HCT, POC 16.1  09.6 - 47.9 %   MCV 85.2  80 - 97 fL   MCH, POC 29.1  27 - 31.2 pg   MCHC 34.2  31.8 - 35.4 g/dL   RDW, POC 04.5     Platelet Count, POC 169.0  142 - 424 K/uL   MPV 8.5  0 - 99.8 fL  POCT URINALYSIS DIPSTICK      Result Value Range   Color, UA yellow     Clarity, UA clear     Glucose, UA neg     Bilirubin, UA neg     Ketones, UA neg     Spec Grav, UA 1.020     Blood, UA neg     pH, UA 6.5     Protein, UA neg     Urobilinogen, UA negative     Nitrite, UA neg     Leukocytes, UA moderate (2+)    POCT UA - MICROSCOPIC ONLY      Result Value Range   WBC, Ur, HPF, POC 1-2     RBC, urine, microscopic neg     Bacteria, U Microscopic few      Mucus, UA trace     Epithelial cells, urine per micros occ     Crystals, Ur, HPF, POC neg     Casts, Ur, LPF, POC occ     Yeast, UA neg     No orders of the defined types were placed in this encounter.   RTC 3 months.  Jeremey Bascom P. Modesto Charon, M.D.

## 2013-04-03 NOTE — Telephone Encounter (Signed)
Ok for pt to have labs -- pt aware

## 2013-04-04 ENCOUNTER — Ambulatory Visit: Payer: Medicare Other | Admitting: Physical Therapy

## 2013-04-04 DIAGNOSIS — R269 Unspecified abnormalities of gait and mobility: Secondary | ICD-10-CM | POA: Diagnosis not present

## 2013-04-04 DIAGNOSIS — R279 Unspecified lack of coordination: Secondary | ICD-10-CM | POA: Diagnosis not present

## 2013-04-04 DIAGNOSIS — IMO0001 Reserved for inherently not codable concepts without codable children: Secondary | ICD-10-CM | POA: Diagnosis not present

## 2013-04-04 LAB — URINE CULTURE
Colony Count: NO GROWTH
Organism ID, Bacteria: NO GROWTH

## 2013-04-04 LAB — VITAMIN D 25 HYDROXY (VIT D DEFICIENCY, FRACTURES): Vit D, 25-Hydroxy: 52 ng/mL (ref 30–89)

## 2013-04-04 NOTE — Progress Notes (Signed)
Quick Note:  Labs abnormal. Xrays shows only arthritis.No fractures. I would have her still see the Orthopedist because to me the little finger looks suspicious. ______

## 2013-04-05 LAB — NMR LIPOPROFILE WITH LIPIDS
Cholesterol, Total: 202 mg/dL — ABNORMAL HIGH (ref <200)
HDL Particle Number: 34.9 umol/L (ref >=30.5)
HDL Size: 9.9 nm (ref >=9.2)
HDL-C: 67 mg/dL (ref >=40)
LDL (calc): 118 mg/dL — ABNORMAL HIGH (ref <100)
LDL Particle Number: 1364 nmol/L — ABNORMAL HIGH (ref <1000)
LDL Size: 21.2 nm (ref >20.5)
LP-IR Score: <25 (ref <=45)
Large HDL-P: 13.3 umol/L (ref >=4.8)
Large VLDL-P: <0.8 nmol/L (ref <=2.7)
Small LDL Particle Number: 311 nmol/L (ref <=527)
Triglycerides: 85 mg/dL (ref <150)
VLDL Size: 37.4 nm (ref <=46.6)

## 2013-04-06 NOTE — Progress Notes (Signed)
Quick Note:  Call patient. Labsokay for her age No change in plans. ______

## 2013-04-11 ENCOUNTER — Ambulatory Visit: Payer: Medicare Other | Attending: Family Medicine | Admitting: Physical Therapy

## 2013-04-11 DIAGNOSIS — R279 Unspecified lack of coordination: Secondary | ICD-10-CM | POA: Insufficient documentation

## 2013-04-11 DIAGNOSIS — R269 Unspecified abnormalities of gait and mobility: Secondary | ICD-10-CM | POA: Diagnosis not present

## 2013-04-11 DIAGNOSIS — IMO0001 Reserved for inherently not codable concepts without codable children: Secondary | ICD-10-CM | POA: Diagnosis not present

## 2013-04-12 ENCOUNTER — Telehealth: Payer: Self-pay | Admitting: Family Medicine

## 2013-04-13 NOTE — Telephone Encounter (Signed)
Pt wanted to cancel appt 04-19-13 stated she is doing better using her walker more and does not feel like she needs to be seen next week and will call prn

## 2013-04-19 ENCOUNTER — Ambulatory Visit: Payer: Medicare Other | Admitting: Family Medicine

## 2013-04-19 ENCOUNTER — Ambulatory Visit: Payer: Medicare Other | Admitting: Physical Therapy

## 2013-04-19 DIAGNOSIS — R269 Unspecified abnormalities of gait and mobility: Secondary | ICD-10-CM | POA: Diagnosis not present

## 2013-04-19 DIAGNOSIS — R279 Unspecified lack of coordination: Secondary | ICD-10-CM | POA: Diagnosis not present

## 2013-04-19 DIAGNOSIS — IMO0001 Reserved for inherently not codable concepts without codable children: Secondary | ICD-10-CM | POA: Diagnosis not present

## 2013-04-25 DIAGNOSIS — M19049 Primary osteoarthritis, unspecified hand: Secondary | ICD-10-CM | POA: Diagnosis not present

## 2013-04-25 DIAGNOSIS — M79609 Pain in unspecified limb: Secondary | ICD-10-CM | POA: Diagnosis not present

## 2013-04-25 DIAGNOSIS — M810 Age-related osteoporosis without current pathological fracture: Secondary | ICD-10-CM | POA: Diagnosis not present

## 2013-04-25 DIAGNOSIS — M259 Joint disorder, unspecified: Secondary | ICD-10-CM | POA: Diagnosis not present

## 2013-04-25 DIAGNOSIS — M1990 Unspecified osteoarthritis, unspecified site: Secondary | ICD-10-CM | POA: Diagnosis not present

## 2013-04-25 DIAGNOSIS — M069 Rheumatoid arthritis, unspecified: Secondary | ICD-10-CM | POA: Diagnosis not present

## 2013-05-02 ENCOUNTER — Telehealth: Payer: Self-pay | Admitting: Family Medicine

## 2013-05-09 ENCOUNTER — Telehealth: Payer: Self-pay | Admitting: Family Medicine

## 2013-05-09 DIAGNOSIS — M109 Gout, unspecified: Secondary | ICD-10-CM | POA: Diagnosis not present

## 2013-05-09 DIAGNOSIS — M1990 Unspecified osteoarthritis, unspecified site: Secondary | ICD-10-CM | POA: Diagnosis not present

## 2013-05-09 DIAGNOSIS — N289 Disorder of kidney and ureter, unspecified: Secondary | ICD-10-CM | POA: Diagnosis not present

## 2013-05-09 DIAGNOSIS — M65839 Other synovitis and tenosynovitis, unspecified forearm: Secondary | ICD-10-CM | POA: Diagnosis not present

## 2013-05-09 DIAGNOSIS — M130 Polyarthritis, unspecified: Secondary | ICD-10-CM | POA: Diagnosis not present

## 2013-05-09 NOTE — Telephone Encounter (Signed)
Appt given for tomorrow per daughters request 

## 2013-05-10 ENCOUNTER — Encounter: Payer: Self-pay | Admitting: Physician Assistant

## 2013-05-10 ENCOUNTER — Ambulatory Visit (INDEPENDENT_AMBULATORY_CARE_PROVIDER_SITE_OTHER): Payer: Medicare Other | Admitting: Physician Assistant

## 2013-05-10 VITALS — BP 111/56 | HR 84 | Temp 98.1°F | Ht 62.0 in | Wt 130.0 lb

## 2013-05-10 DIAGNOSIS — N289 Disorder of kidney and ureter, unspecified: Secondary | ICD-10-CM | POA: Diagnosis not present

## 2013-05-10 NOTE — Patient Instructions (Signed)
Kidney Disease, Adult  The kidneys are two organs that lie on either side of the spine between the middle of the back and the front of the abdomen. The kidneys:    Remove wastes and extra water from the blood.    Produce important hormones. These regulate blood pressure, help keep bones strong, and help create red blood cells.    Balance the fluids and chemicals in the blood and tissues.  Kidney disease occurs when the kidneys are damaged. Kidney damage may be sudden (acute) or develop over a long period (chronic). A small amount of damage may not cause problems, but a large amount of damage may make it difficult or impossible for the kidneys to work the way they should. Early detection and treatment of kidney disease may prevent kidney damage from becoming permanent or getting worse. Some kidney diseases are curable, but most are not. Many people with kidney disease are able to control the disease and live a normal life.   TYPES OF KIDNEY DISEASE   Acute kidney injury.Acute kidney injury occurs when there is sudden damage to the kidneys.   Chronic kidney disease. Chronic kidney disease occurs when the kidneys are damaged over a long period.   End-stage kidney disease. End-stage kidney disease occurs when the kidneys are so damaged that they stop working. In end-stage kidney disease, the kidneys cannot get better.  CAUSES  Any condition, disease, or event that damages the kidneys may cause kidney disease.  Acute kidney injury.   A problem with blood flow to the kidneys. This may be caused by:    Blood loss.    Heart disease.    Severe burns.    Liver disease.   Direct damage to the kidneys. This may be caused by:   Some medicines.    A kidney infection.    Poisoning or consuming toxic substances.    A surgical wound.    A blow to the kidney area.    A problem with urine flow. This may be caused by:    Cancer.    Kidney stones.    An enlarged prostate.  Chronic kidney disease. The  most common causes of chronic kidney disease are diabetes and high blood pressure (hypertension). Chronic kidney disease may also be caused by:    Diseases that cause the filtering units of the kidneys to become inflamed.    Diseases that affect the immune system.    Genetic diseases.    Medicines that damage the kidneys, such as anti-inflammatory medicines.   Poisoning or exposure to toxic substances.    A reoccurring kidney or urinary infection.    A problem with urine flow. This may be caused by:   Cancer.    Kidney stones.    An enlarged prostate in males.  End-stage kidney disease. This kidney disease usually occurs when a chronic kidney disease gets worse. It may also occur after acute kidney injury.   SYMPTOMS    Swelling (edema) of the legs, ankles, or feet.    Tiredness (lethargy).    Nausea or vomiting.    Confusion.    Problems with urination, such as:    Painful or burning feeling during urination.    Decreased urine production.   Bloody urine.    Frequent urination, especially at night.   Hypertension.   Muscle twitches and cramps.    Shortness of breath.    Persistent itchiness.    Loss of appetite.   Metallic taste in the   mouth.    Weakness.    Seizures.    Chest pain or pressure.    Trouble sleeping.    Headaches.    Abnormally dark or light skin.    Numbness in the hands or feet.    Easy bruising.    Frequent hiccups.    Menstruation stops.  Sometimes, no symptoms are present.  DIAGNOSIS   Kidney disease may be detected and diagnosed by tests, including blood, urine, imaging, or kidney biopsy tests.   TREATMENT   Acute kidney injury. Treatment of acute kidney injury varies depending on the cause and severity of the kidney damage. In mild cases, no treatment may be needed. The kidneys may heal on their own. If acute kidney injury is more severe, your caregiver will treat the cause of the kidney damage, help the kidneys heal, and  prevent complications from occurring. Severe cases may require a procedure to remove toxic wastes from the body (dialysis) or surgery to repair kidney damage. Surgery may involve:    Repair of a torn kidney.    Removal of an obstruction.   Most of the time, you will need to stay overnight at the hospital.   Chronic kidney disease. Most chronic kidney diseases cannot be cured. Treatment usually involves relieving symptoms and preventing or slowing the progression of the disease. Treatment may include:    A special diet. You may need to avoid alcohol and foods that:    Have added salt.    Are high in potassium.    Are high in protein.    Medicines. These may:    Lower blood pressure.    Relieve anemia.    Relieve swelling.    Protect the bones.   End-stage kidney disease. End-stage kidney disease is life-threatening and must be treated immediately. There are two treatments for end-stage kidney disease:    Dialysis.    Receiving a new kidney (kidney transplant).  Both of these treatments have serious risks and consequences. In addition to having dialysis or a kidney transplant, you may need to take medicines to control hypertension and cholesterol and to decrease phosphorus levels in your blood.  LENGTH OF ILLNESS   Acute kidney injury.The length of this disease varies greatly from person to person. Exactly how long it lasts depends on the cause of the kidney damage. Acute kidney injury may develop into chronic kidney disease or end-stage kidney disease.   Chronic kidney disease. This disease usually lasts a lifetime. Chronic kidney disease may worsen over time to become end-stage kidney disease. The time it takes for end-stage kidney disease to develop varies from person to person.   End-stage kidney disease. This disease lasts until a kidney transplant is performed.  PREVENTION   Kidney disease can sometimes be prevented. If you have diabetes, hypertension, or any other condition that may  lead to kidney disease, you should try to prevent kidney disease with:    An appropriate diet.   Medicine.   Lifestyle changes.  FOR MORE INFORMATION   American Association of Kidney Patients: www.aakp.org   National Kidney Foundation: www.kidney.org   American Kidney Fund: www.akfinc.org   Life Options [employer] Program: www.lifeoptions.org and www.kidneyschool.org   Document Released: 10/25/2005 Document Revised: 10/11/2012 Document Reviewed: 06/23/2012  [employer] Patient Information 2014 [employer].

## 2013-05-10 NOTE — Progress Notes (Signed)
Subjective:     Patient ID: Sue Schaefer, female   DOB: 25-Jun-1920, 79 y.o.   MRN: [id number]  HPI Pt here requesting referral to renal MD She is a regular pt of Dr Modesto Charon She was recently referred to Rheum There she had labs done that showed a decrease in renal function Rheum wanted her to see Nephrologist before starting any meds Pt would like referral to [facility] They forgot to bring copy of labs from spec  Review of Systems     Objective:   Physical Exam Defer Review of labs here only shows sl decline in renal function expected for age    Assessment:     1. Deterioration in renal function        Plan:     Will do referral as requested Pt is holding on all OTC NSAIDS Keep regular f/u with Dr Modesto Charon

## 2013-05-16 ENCOUNTER — Ambulatory Visit: Payer: Medicare Other | Attending: Family Medicine | Admitting: Physical Therapy

## 2013-05-16 DIAGNOSIS — R269 Unspecified abnormalities of gait and mobility: Secondary | ICD-10-CM | POA: Diagnosis not present

## 2013-05-16 DIAGNOSIS — IMO0001 Reserved for inherently not codable concepts without codable children: Secondary | ICD-10-CM | POA: Diagnosis not present

## 2013-05-16 DIAGNOSIS — R279 Unspecified lack of coordination: Secondary | ICD-10-CM | POA: Insufficient documentation

## 2013-05-23 ENCOUNTER — Ambulatory Visit: Payer: Medicare Other | Admitting: Physical Therapy

## 2013-05-29 ENCOUNTER — Telehealth: Payer: Self-pay | Admitting: Family Medicine

## 2013-05-30 ENCOUNTER — Ambulatory Visit: Payer: Medicare Other | Admitting: Physical Therapy

## 2013-05-30 ENCOUNTER — Other Ambulatory Visit: Payer: Self-pay | Admitting: Family Medicine

## 2013-05-31 NOTE — Telephone Encounter (Signed)
Working on getting this taken care of

## 2013-06-06 ENCOUNTER — Ambulatory Visit: Payer: Medicare Other | Admitting: Physical Therapy

## 2013-06-15 ENCOUNTER — Ambulatory Visit: Payer: Self-pay | Admitting: Family Medicine

## 2013-06-16 ENCOUNTER — Telehealth: Payer: Self-pay | Admitting: Family Medicine

## 2013-06-16 NOTE — Telephone Encounter (Signed)
Tylenol is metabolized or broken down in the liver, too much can harm liver. It isn't recommended to take more than 3,000mg  a day.

## 2013-06-16 NOTE — Telephone Encounter (Signed)
Wants to know if its safe to take 500 mg of acetaminophen with everything else she is one it says she can take up to 6 times a day

## 2013-06-18 NOTE — Telephone Encounter (Signed)
Left details on Sue Schaefer's number and her daughter's.

## 2013-06-27 ENCOUNTER — Telehealth: Payer: Self-pay | Admitting: Family Medicine

## 2013-06-28 NOTE — Telephone Encounter (Signed)
Appointments are every 3 months. Due to Hypertension and her medical problems and renal insufficiency. The renal function deteriorates with age and worsen with other medical problems and she is also on other medications with potential risks for side effects. It is up to the patient and family if they want to be compliant with needed follow up or not and take the risks assciated with not following up. Kriston Pasquarello P. Modesto Charon, M.D.

## 2013-06-28 NOTE — Telephone Encounter (Signed)
Left message with the following recommendations and to call if questions

## 2013-07-02 DIAGNOSIS — M129 Arthropathy, unspecified: Secondary | ICD-10-CM | POA: Diagnosis not present

## 2013-07-02 DIAGNOSIS — Z9889 Other specified postprocedural states: Secondary | ICD-10-CM | POA: Diagnosis not present

## 2013-07-02 DIAGNOSIS — Z947 Corneal transplant status: Secondary | ICD-10-CM | POA: Diagnosis not present

## 2013-07-02 DIAGNOSIS — I129 Hypertensive chronic kidney disease with stage 1 through stage 4 chronic kidney disease, or unspecified chronic kidney disease: Secondary | ICD-10-CM | POA: Diagnosis not present

## 2013-07-02 DIAGNOSIS — H409 Unspecified glaucoma: Secondary | ICD-10-CM | POA: Diagnosis not present

## 2013-07-02 DIAGNOSIS — H4010X0 Unspecified open-angle glaucoma, stage unspecified: Secondary | ICD-10-CM | POA: Diagnosis not present

## 2013-07-02 DIAGNOSIS — N183 Chronic kidney disease, stage 3 unspecified: Secondary | ICD-10-CM | POA: Diagnosis not present

## 2013-07-02 IMAGING — CR DG WRIST COMPLETE 3+V*R*
3 series · 3 of 3 positions shown · non-contrast
Comparison: None.

CLINICAL DATA: Post fall, now with wrist swelling

RIGHT WRIST - COMPLETE 3+ VIEW

[view not recorded (1 of 3)]
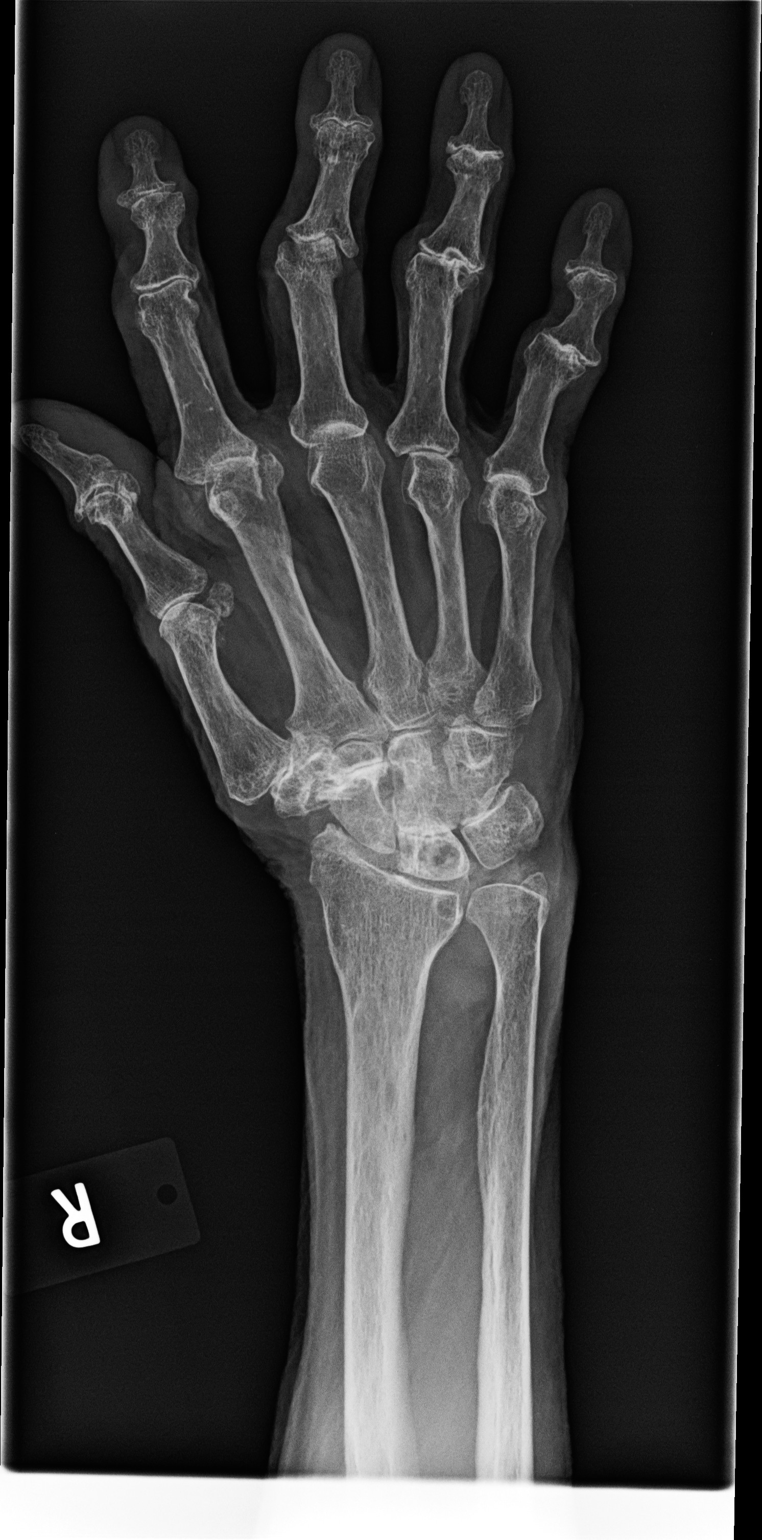

[view not recorded (2 of 3)]
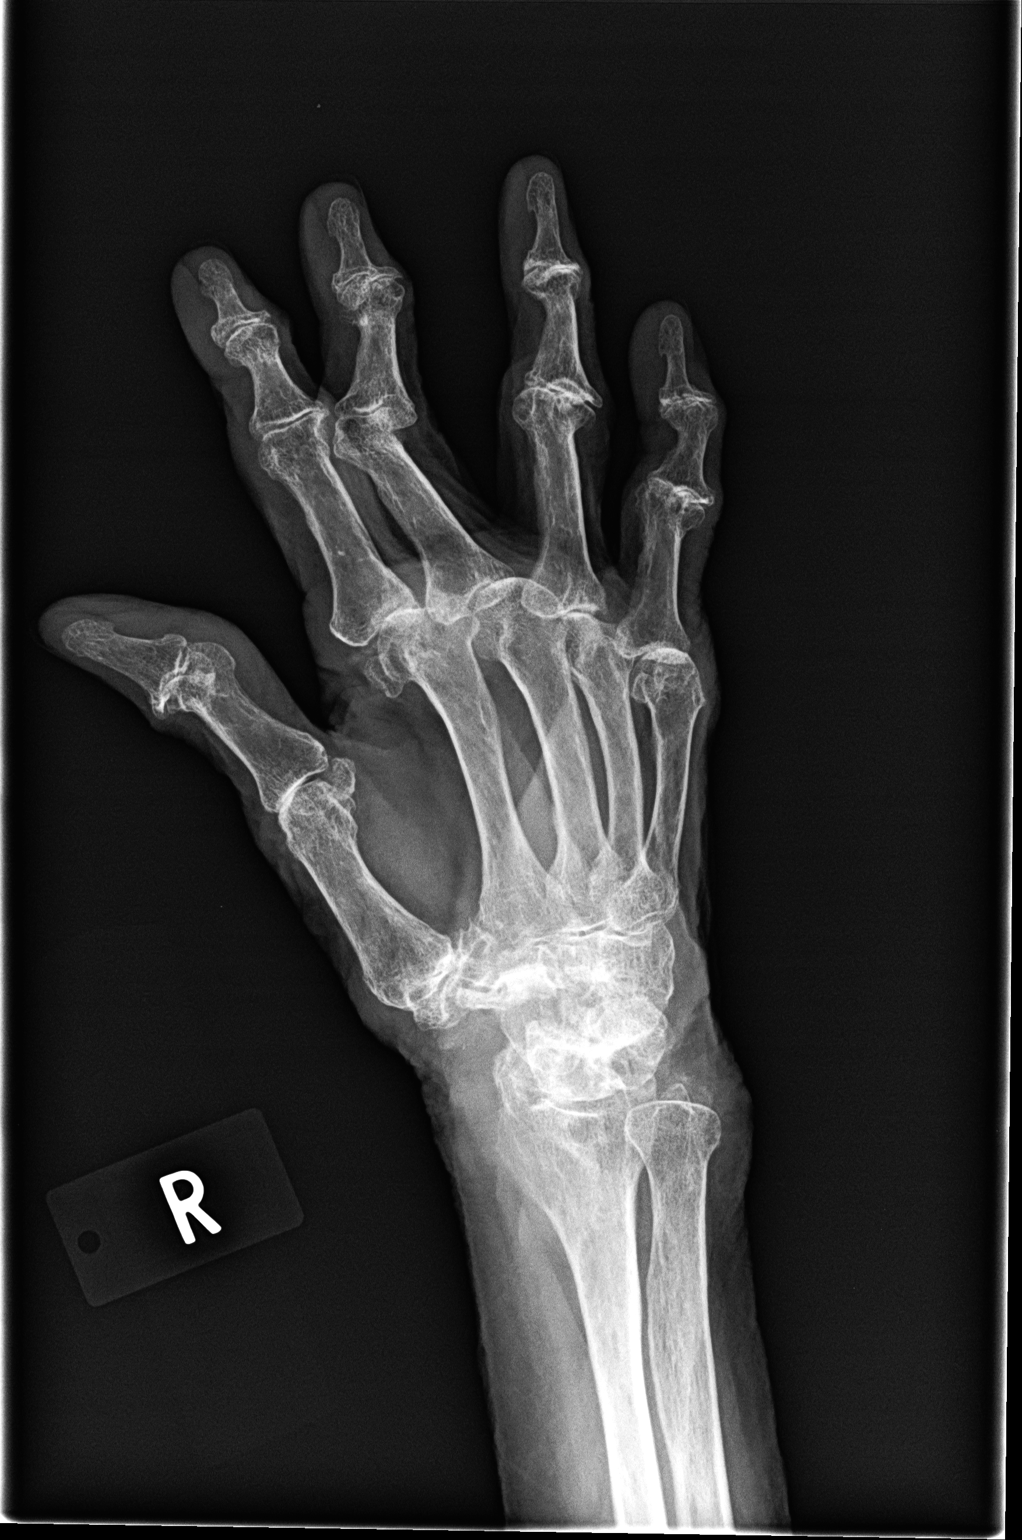

[view not recorded (3 of 3)]
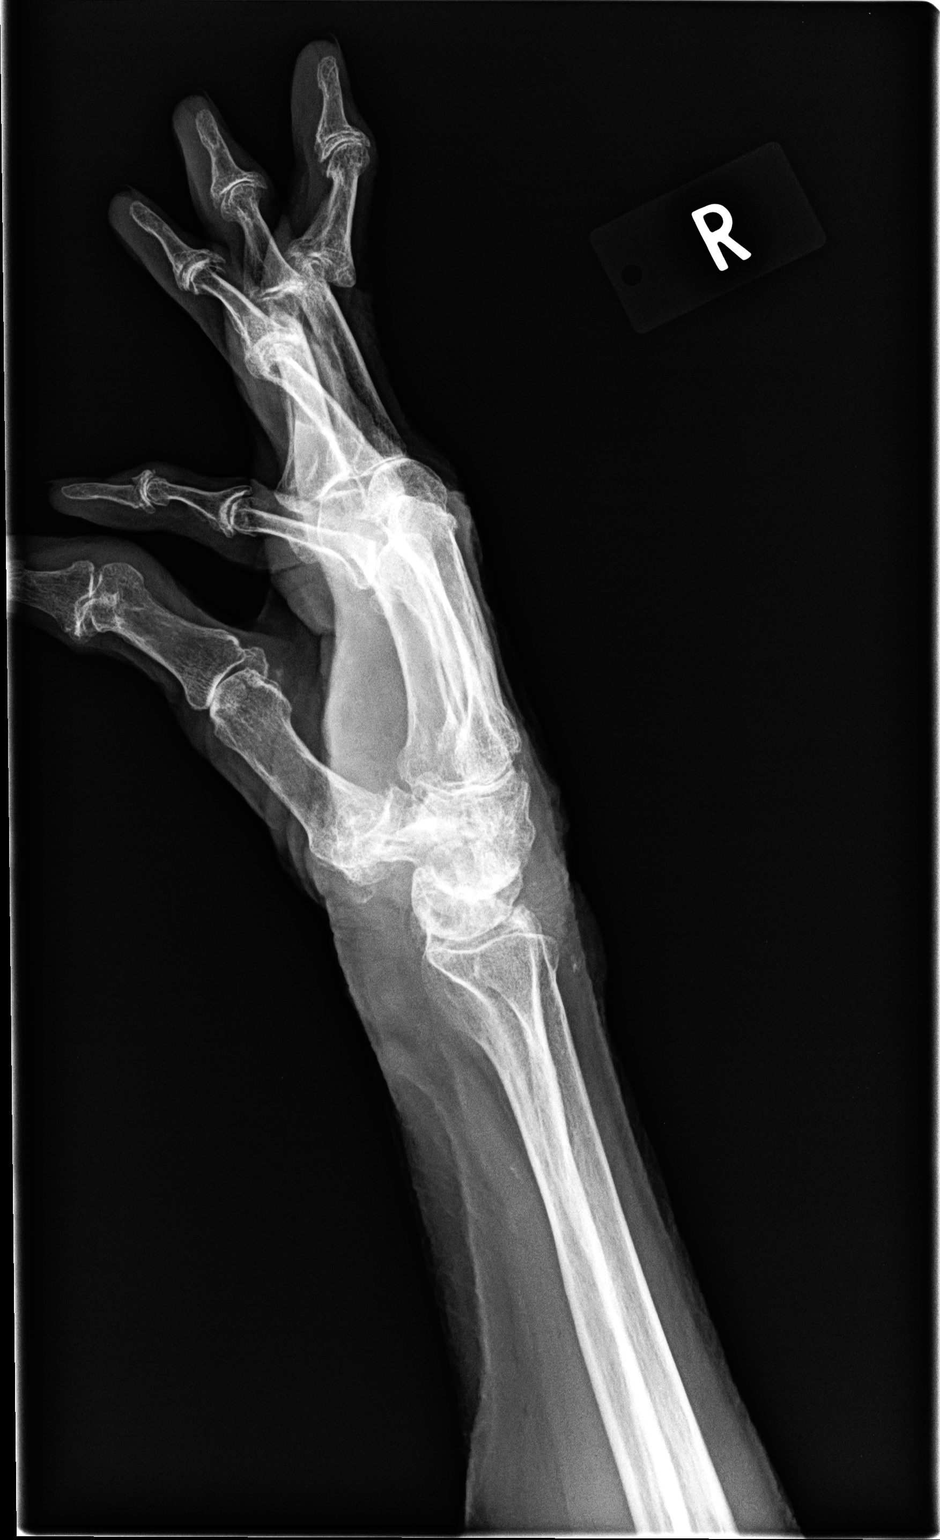

[3 of 3 positions shown; findings below may reference images not displayed]

FINDINGS: The lateral radiograph is degraded secondary to obliquity.

Diffuse osteopenia without definite displaced fracture.  No
definite displacement of the pronator quadratus fat pad. Punctate
calcification overlying the soft tissues about the dorsal aspect of
the distal radius are favored to be dermal in etiology. No
radiopaque foreign body.

Marked degenerative change of the STT joints of the base of the
thumb with associated subluxation of the base of the first
metacarpal.

There is marked degenerative change of multiple intercarpal
articulations.  There is dense sclerosis of the lunate.
Chondrocalcinosis is seen within the TFCC.  There is advanced
primarily distal arthropathy with joint space loss and articular
surface irregularity.

No dislocation.  There is radial subluxation of the first proximal
phalanx of the first digit in relation to the MCP joint.  There is
mild ulnar deviation of nearly all of the PIP joints of the second
through fourth digits, most conspicuously involving the third
digit.
IMPRESSION: 1. Diffuse osteopenia without definite displaced fracture or
dislocation.

2. Chondrocalcinosis suggestive of CPPD.
3.  Advanced primarily distal arthropathy, possibly secondary OA
though erosive osteoarthritis may have a similar appearance.
4.  Dense sclerosis of the lunate suggestive of Kienbock's malacia.

Clinically significant discrepancy from primary report, if
provided: None

## 2013-07-31 ENCOUNTER — Telehealth: Payer: Self-pay | Admitting: Family Medicine

## 2013-07-31 NOTE — Telephone Encounter (Signed)
It is best to purchase the neoprene knee braces in the pharmacy, or [facility], or [facility]. A prescription is not necessary. Buy the one with the hole in it for the knee cap. I would not use an ace wrap because it would come lose and patient would be at risk to trip. Also to come in to be fitted is a possibility if we have her size. It would mean an Office visit. Thanks.  Sem Mccaughey P. [place] Charon, M.D.

## 2013-08-01 ENCOUNTER — Other Ambulatory Visit: Payer: Self-pay | Admitting: Family Medicine

## 2013-08-02 NOTE — Telephone Encounter (Signed)
Spoke with Sue Schaefer and she did not want the neoprene ones and advised she could come in for office visit for the fitting of her size but she declined office visit.

## 2013-08-14 DIAGNOSIS — Z23 Encounter for immunization: Secondary | ICD-10-CM | POA: Diagnosis not present

## 2013-10-02 ENCOUNTER — Other Ambulatory Visit: Payer: Self-pay | Admitting: Family Medicine

## 2013-10-02 ENCOUNTER — Other Ambulatory Visit: Payer: Self-pay | Admitting: Physician Assistant

## 2013-10-26 ENCOUNTER — Ambulatory Visit (INDEPENDENT_AMBULATORY_CARE_PROVIDER_SITE_OTHER): Payer: Medicare Other | Admitting: Family Medicine

## 2013-10-26 ENCOUNTER — Encounter: Payer: Self-pay | Admitting: Family Medicine

## 2013-10-26 VITALS — BP 104/57 | HR 76 | Temp 97.9°F | Ht 62.0 in | Wt 132.4 lb

## 2013-10-26 DIAGNOSIS — M899 Disorder of bone, unspecified: Secondary | ICD-10-CM

## 2013-10-26 DIAGNOSIS — F0391 Unspecified dementia with behavioral disturbance: Secondary | ICD-10-CM | POA: Diagnosis not present

## 2013-10-26 DIAGNOSIS — R350 Frequency of micturition: Secondary | ICD-10-CM | POA: Diagnosis not present

## 2013-10-26 DIAGNOSIS — M8580 Other specified disorders of bone density and structure, unspecified site: Secondary | ICD-10-CM

## 2013-10-26 DIAGNOSIS — H9190 Unspecified hearing loss, unspecified ear: Secondary | ICD-10-CM

## 2013-10-26 DIAGNOSIS — F03918 Unspecified dementia, unspecified severity, with other behavioral disturbance: Secondary | ICD-10-CM

## 2013-10-26 DIAGNOSIS — I2510 Atherosclerotic heart disease of native coronary artery without angina pectoris: Secondary | ICD-10-CM | POA: Diagnosis not present

## 2013-10-26 DIAGNOSIS — I10 Essential (primary) hypertension: Secondary | ICD-10-CM

## 2013-10-26 DIAGNOSIS — Z79899 Other long term (current) drug therapy: Secondary | ICD-10-CM

## 2013-10-26 DIAGNOSIS — I25810 Atherosclerosis of coronary artery bypass graft(s) without angina pectoris: Secondary | ICD-10-CM

## 2013-10-26 DIAGNOSIS — E785 Hyperlipidemia, unspecified: Secondary | ICD-10-CM

## 2013-10-26 LAB — POCT UA - MICROSCOPIC ONLY
Bacteria, U Microscopic: NEGATIVE
Casts, Ur, LPF, POC: NEGATIVE
Crystals, Ur, HPF, POC: NEGATIVE
Mucus, UA: NEGATIVE
RBC, urine, microscopic: NEGATIVE
Yeast, UA: NEGATIVE

## 2013-10-26 LAB — POCT URINALYSIS DIPSTICK
Bilirubin, UA: NEGATIVE
Blood, UA: NEGATIVE
Glucose, UA: NEGATIVE
Ketones, UA: NEGATIVE
Nitrite, UA: NEGATIVE
Protein, UA: NEGATIVE
Spec Grav, UA: >=1.03
Urobilinogen, UA: NEGATIVE
pH, UA: 5

## 2013-10-26 MED ORDER — ATENOLOL 25 MG PO TABS: ORAL_TABLET | ORAL | Status: DC

## 2013-10-26 MED ORDER — NIFEDIPINE ER OSMOTIC RELEASE 30 MG PO TB24: ORAL_TABLET | ORAL | Status: DC

## 2013-10-26 MED ORDER — DONEPEZIL HCL 10 MG PO TABS: 10.0000 mg | ORAL_TABLET | Freq: Every evening | ORAL | Status: DC | PRN

## 2013-10-26 MED ORDER — FUROSEMIDE 40 MG PO TABS: ORAL_TABLET | ORAL | Status: DC

## 2013-10-26 MED ORDER — ALENDRONATE SODIUM 70 MG PO TABS: ORAL_TABLET | ORAL | Status: DC

## 2013-10-26 NOTE — Progress Notes (Signed)
Patient ID: Sue Schaefer, female   DOB: 1919/11/17, 79 y.o.   MRN: [id number] SUBJECTIVE: CC: Chief Complaint  Patient presents with  . Hypertension  . Coronary Artery Disease    HPI: Took our advise and uses the walker now. No more falls.  Patient is here for follow up of hypertension: denies Headache;deniesChest Pain;denies weakness;denies Shortness of Breath or Orthopnea;denies Visual changes;denies palpitations;denies cough;denies pedal edema;denies symptoms of TIA or stroke; admits to Compliance with medications. denies Problems with medications.   Has some frequency of urination. Drinks a bit of water at nights prior to bed Past Medical History  Diagnosis Date  . Hypertension   . URI (upper respiratory infection)   . Anemia   . Memory loss   . Osteoporosis   . Vitamin D deficiency    History reviewed. No pertinent past surgical history. History   Social History  . Marital Status: Widowed    Spouse Name: N/A    Number of Children: N/A  . Years of Education: N/A   Occupational History  . Not on file.   Social History Main Topics  . Smoking status: Never Smoker   . Smokeless tobacco: Never Used  . Alcohol Use: No  . Drug Use: No  . Sexual Activity: Not on file   Other Topics Concern  . Not on file   Social History Narrative  . No narrative on file   No family history on file. Current Outpatient Prescriptions on File Prior to Visit  Medication Sig Dispense Refill  . Ascorbic Acid (VITAMIN C) 1000 MG tablet Take 1,000 mg by mouth daily.        Marland Kitchen aspirin 81 MG chewable tablet Chew 81 mg by mouth daily.        . B Complex-C-Min-Fe-FA (HEMATINIC PLUS COMPLEX PO) Take by mouth 2 (two) times daily.        . Cholecalciferol (VITAMIN D3) 10000 UNITS capsule Take 10,000 Units by mouth daily.        Marland Kitchen KRILL OIL PO Take 1 capsule by mouth daily.      . Lutein 6 MG CAPS Take by mouth daily.        . mirabegron ER (MYRBETRIQ) 50 MG TB24 Take 1 tablet (50 mg total) by  mouth at bedtime.  30 tablet  11  . nitroGLYCERIN (NITROSTAT) 0.4 MG SL tablet Place 0.4 mg under the tongue every 5 (five) minutes as needed.        . polycarbophil (FIBERCON) 625 MG tablet Take 625 mg by mouth 2 (two) times daily.        . vitamin E 400 UNIT capsule Take 400 Units by mouth daily.         No current facility-administered medications on file prior to visit.   Allergies  Allergen Reactions  . Keflex [Cephalexin]   . Penicillins Cross Reactors     Patient is allergic to ALL [facility] DRUGS!!!    There is no immunization history on file for this patient. Prior to Admission medications   Medication Sig Start Date End Date Taking? Authorizing Provider  alendronate (FOSAMAX) 70 MG tablet TAKE 1 TABLET ONCE A WEEK 10/02/13   Ileana Ladd, MD  Ascorbic Acid (VITAMIN C) 1000 MG tablet Take 1,000 mg by mouth daily.      Historical Provider, MD  aspirin 81 MG chewable tablet Chew 81 mg by mouth daily.      Historical Provider, MD  atenolol (TENORMIN) 25 MG tablet TAKE 1 TABLET  ONCE A DAY 10/02/13   Ileana Ladd, MD  B Complex-C-Min-Fe-FA (HEMATINIC PLUS COMPLEX PO) Take by mouth 2 (two) times daily.      Historical Provider, MD  Cholecalciferol (VITAMIN D3) 10000 UNITS capsule Take 10,000 Units by mouth daily.      Historical Provider, MD  donepezil (ARICEPT) 10 MG tablet Take 10 mg by mouth at bedtime as needed.      Historical Provider, MD  furosemide (LASIX) 40 MG tablet TAKE 1 TABLET DAILY 03/30/13   Ileana Ladd, MD  furosemide (LASIX) 40 MG tablet TAKE 1 TABLET DAILY 05/30/13   Inis Sizer, PA-C  KRILL OIL PO Take 1 capsule by mouth daily.    Historical Provider, MD  Lutein 6 MG CAPS Take by mouth daily.      Historical Provider, MD  mirabegron ER (MYRBETRIQ) 50 MG TB24 Take 1 tablet (50 mg total) by mouth at bedtime. 03/08/13   Lazaro Arms, MD  NIFEdipine (PROCARDIA-XL/ADALAT-CC/NIFEDICAL-XL) 30 MG 24 hr tablet TAKE 1 TABLET ONCE A DAY 08/01/13   Ileana Ladd, MD   nitroGLYCERIN (NITROSTAT) 0.4 MG SL tablet Place 0.4 mg under the tongue every 5 (five) minutes as needed.      Historical Provider, MD  polycarbophil (FIBERCON) 625 MG tablet Take 625 mg by mouth 2 (two) times daily.      Historical Provider, MD  vitamin E 400 UNIT capsule Take 400 Units by mouth daily.      Historical Provider, MD     ROS: As above in the HPI. All other systems are stable or negative.  OBJECTIVE: APPEARANCE:  Patient in no acute distress.The patient appeared well nourished and normally developed. Acyanotic. Waist: VITAL SIGNS:BP 104/57  Pulse 76  Temp(Src) 97.9 F (36.6 C) (Oral)  Ht 5\' 2"  (1.575 m)  Wt 132 lb 6.4 oz (60.056 kg)  BMI 24.21 kg/m2 WF elderly  SKIN: warm and  Dry without overt rashes, tattoos and scars  HEAD and Neck: without JVD, Head and scalp: normal Eyes:No scleral icterus. No changes in the defects of her eyes. Ears: Auricle normal, canal normal, Tympanic membranes normal, insufflation normal. Nose: normal Throat: normal Neck & thyroid: normal  CHEST & LUNGS: Chest wall: normal Lungs: Clear  CVS: Reveals the PMI to be normally located. Regular rhythm, First and Second Heart sounds are normal,  absence of murmurs, rubs or gallops. Peripheral vasculature: Radial pulses: normal Dorsal pedis pulses: normal Posterior pulses: normal  ABDOMEN:  Appearance: normal Benign, no organomegaly, no masses, no Abdominal Aortic enlargement. No Guarding , no rebound. No Bruits. Bowel sounds: normal  RECTAL: N/A GU: N/A  EXTREMETIES: nonedematous.  NEUROLOGIC: oriented to,place and person;able to ambulate with stability now with a walker that she is now using regularly.   ASSESSMENT:  Coronary artery disease - Plan: atenolol (TENORMIN) 25 MG tablet  Essential hypertension, benign - Plan: furosemide (LASIX) 40 MG tablet, NIFEdipine (PROCARDIA-XL/ADALAT-CC/NIFEDICAL-XL) 30 MG 24 hr tablet, CMP14+EGFR  Dementia with behavioral  disturbance - Plan: donepezil (ARICEPT) 10 MG tablet  Osteopenia - Plan: alendronate (FOSAMAX) 70 MG tablet  Hearing loss, unspecified laterality  Unspecified essential hypertension  Other and unspecified hyperlipidemia  Encounter for long-term (current) use of other medications  CAD (coronary artery disease) of artery bypass graft  Frequency of urination - Plan: POCT UA - Microscopic Only, POCT urinalysis dipstick, Urine culture  PLAN:  Orders Placed This Encounter  Procedures  . Urine culture  . CMP14+EGFR  . POCT UA -  Microscopic Only  . POCT urinalysis dipstick   Meds ordered this encounter  Medications  . co-enzyme Q-10 30 MG capsule    Sig: Take 30 mg by mouth daily.  Marland Kitchen docusate sodium (COLACE) 100 MG capsule    Sig: Take 200 mg by mouth daily.  . ferrous sulfate 325 (65 FE) MG tablet    Sig: Take 325 mg by mouth daily with breakfast.  . folic acid (FOLVITE) 400 MCG tablet    Sig: Take 400 mcg by mouth daily.  . Garlic 1000 MG CAPS    Sig: Take 1 capsule by mouth daily.  Bertram Gala Glycol-Propyl Glycol (SYSTANE) 0.4-0.3 % SOLN    Sig: Apply 1 drop to eye 4 (four) times daily.  . prednisoLONE acetate (PRED FORTE) 1 % ophthalmic suspension    Sig: Place 1 drop into the right eye 2 (two) times daily.  Marland Kitchen alendronate (FOSAMAX) 70 MG tablet    Sig: TAKE 1 TABLET ONCE A WEEK    Dispense:  4 tablet    Refill:  5  . atenolol (TENORMIN) 25 MG tablet    Sig: TAKE 1 TABLET ONCE A DAY    Dispense:  30 tablet    Refill:  5    Must be seen before next refill  . donepezil (ARICEPT) 10 MG tablet    Sig: Take 1 tablet (10 mg total) by mouth at bedtime as needed.    Dispense:  30 tablet    Refill:  5  . furosemide (LASIX) 40 MG tablet    Sig: TAKE 1 TABLET DAILY    Dispense:  30 tablet    Refill:  5  . NIFEdipine (PROCARDIA-XL/ADALAT-CC/NIFEDICAL-XL) 30 MG 24 hr tablet    Sig: TAKE 1 TABLET ONCE A DAY    Dispense:  30 tablet    Refill:  5   Medications Discontinued  During This Encounter  Medication Reason  . furosemide (LASIX) 40 MG tablet Error  . alendronate (FOSAMAX) 70 MG tablet Reorder  . atenolol (TENORMIN) 25 MG tablet Reorder  . donepezil (ARICEPT) 10 MG tablet Reorder  . furosemide (LASIX) 40 MG tablet Reorder  . NIFEdipine (PROCARDIA-XL/ADALAT-CC/NIFEDICAL-XL) 30 MG 24 hr tablet Reorder  reduce water intake prior to bed. Also use a bedside commode. Turn lights on at night when getting out of bed. Do not rely on the night light only.  Same medications. No changes.  Return in about 3 months (around 01/24/2014) for Recheck medical problems.  Lawonda Pretlow P. [place] Charon, M.D.

## 2013-10-27 LAB — CMP14+EGFR
ALT: 21 IU/L (ref 0–32)
AST: 30 IU/L (ref 0–40)
Albumin/Globulin Ratio: 1.8 (ref 1.1–2.5)
Albumin: 4.4 g/dL (ref 3.2–4.6)
Alkaline Phosphatase: 65 IU/L (ref 39–117)
BUN/Creatinine Ratio: 27 — ABNORMAL HIGH (ref 11–26)
BUN: 34 mg/dL (ref 10–36)
CO2: 23 mmol/L (ref 18–29)
Calcium: 9.7 mg/dL (ref 8.6–10.2)
Chloride: 100 mmol/L (ref 97–108)
Creatinine, Ser: 1.27 mg/dL — ABNORMAL HIGH (ref 0.57–1.00)
GFR calc Af Amer: 43 mL/min/{1.73_m2} — ABNORMAL LOW (ref >59)
GFR calc non Af Amer: 37 mL/min/{1.73_m2} — ABNORMAL LOW (ref >59)
Globulin, Total: 2.4 g/dL (ref 1.5–4.5)
Glucose: 91 mg/dL (ref 65–99)
Potassium: 4.5 mmol/L (ref 3.5–5.2)
Sodium: 141 mmol/L (ref 134–144)
Total Bilirubin: 0.3 mg/dL (ref 0.0–1.2)
Total Protein: 6.8 g/dL (ref 6.0–8.5)

## 2013-10-27 LAB — URINE CULTURE

## 2014-01-21 ENCOUNTER — Encounter: Payer: Self-pay | Admitting: Family Medicine

## 2014-01-21 ENCOUNTER — Telehealth: Payer: Self-pay | Admitting: Family Medicine

## 2014-01-21 ENCOUNTER — Ambulatory Visit (INDEPENDENT_AMBULATORY_CARE_PROVIDER_SITE_OTHER): Payer: Medicare Other | Admitting: Family Medicine

## 2014-01-21 ENCOUNTER — Ambulatory Visit: Payer: Medicare Other | Admitting: Family Medicine

## 2014-01-21 VITALS — BP 134/70 | HR 112 | Temp 97.4°F | Ht 62.0 in | Wt 131.6 lb

## 2014-01-21 DIAGNOSIS — I25810 Atherosclerosis of coronary artery bypass graft(s) without angina pectoris: Secondary | ICD-10-CM | POA: Diagnosis not present

## 2014-01-21 DIAGNOSIS — M899 Disorder of bone, unspecified: Secondary | ICD-10-CM

## 2014-01-21 DIAGNOSIS — M8580 Other specified disorders of bone density and structure, unspecified site: Secondary | ICD-10-CM

## 2014-01-21 DIAGNOSIS — E785 Hyperlipidemia, unspecified: Secondary | ICD-10-CM

## 2014-01-21 DIAGNOSIS — M949 Disorder of cartilage, unspecified: Secondary | ICD-10-CM

## 2014-01-21 DIAGNOSIS — F0391 Unspecified dementia with behavioral disturbance: Secondary | ICD-10-CM

## 2014-01-21 DIAGNOSIS — Z79899 Other long term (current) drug therapy: Secondary | ICD-10-CM | POA: Diagnosis not present

## 2014-01-21 DIAGNOSIS — F03918 Unspecified dementia, unspecified severity, with other behavioral disturbance: Secondary | ICD-10-CM

## 2014-01-21 DIAGNOSIS — H9190 Unspecified hearing loss, unspecified ear: Secondary | ICD-10-CM

## 2014-01-21 DIAGNOSIS — I10 Essential (primary) hypertension: Secondary | ICD-10-CM

## 2014-01-21 MED ORDER — MEMANTINE HCL ER 7 & 14 & 21 &28 MG PO CP24: ORAL_CAPSULE | ORAL | Status: DC

## 2014-01-21 NOTE — Telephone Encounter (Signed)
Appt moved for later on today

## 2014-01-21 NOTE — Progress Notes (Addendum)
Patient ID: Sue Schaefer, female   DOB: 02/06/1920, 80 y.o.   MRN: [id number] SUBJECTIVE: CC: Chief Complaint  Patient presents with  . Follow-up    states her children wants her in nursing home and she does not want to go .     HPI: Thinks she is able to continue caring for herself at home with meals on wheels. She has some short term and cognitive impairment. But still mentally competent per patient. She is now accepting of using a walker. She says she feels fine. However she had an episode with her alarm and she did not respond to the alert company. Sue Schaefer had to go check on her and she had her tv turned up loud and th ealarm off. Someone checks on her daily. And every Saturday she spends with Sue Schaefer. She refuses to be placed in a facility.  Past Medical History  Diagnosis Date  . Hypertension   . URI (upper respiratory infection)   . Anemia   . Memory loss   . Osteoporosis   . Vitamin D deficiency    No past surgical history on file. History   Social History  . Marital Status: Widowed    Spouse Name: N/A    Number of Children: N/A  . Years of Education: N/A   Occupational History  . Not on file.   Social History Main Topics  . Smoking status: Never Smoker   . Smokeless tobacco: Never Used  . Alcohol Use: No  . Drug Use: No  . Sexual Activity: Not on file   Other Topics Concern  . Not on file   Social History Narrative  . No narrative on file   No family history on file. Current Outpatient Prescriptions on File Prior to Visit  Medication Sig Dispense Refill  . alendronate (FOSAMAX) 70 MG tablet TAKE 1 TABLET ONCE A WEEK  4 tablet  5  . Ascorbic Acid (VITAMIN C) 1000 MG tablet Take 1,000 mg by mouth daily.        Marland Kitchen aspirin 81 MG chewable tablet Chew 81 mg by mouth daily.        Marland Kitchen atenolol (TENORMIN) 25 MG tablet TAKE 1 TABLET ONCE A DAY  30 tablet  5  . B Complex-C-Min-Fe-FA (HEMATINIC PLUS COMPLEX PO) Take by mouth 2 (two) times daily.        . Cholecalciferol  (VITAMIN D3) 10000 UNITS capsule Take 10,000 Units by mouth daily.        Marland Kitchen co-enzyme Q-10 30 MG capsule Take 30 mg by mouth daily.      Marland Kitchen docusate sodium (COLACE) 100 MG capsule Take 200 mg by mouth daily.      Marland Kitchen donepezil (ARICEPT) 10 MG tablet Take 1 tablet (10 mg total) by mouth at bedtime as needed.  30 tablet  5  . ferrous sulfate 325 (65 FE) MG tablet Take 325 mg by mouth daily with breakfast.      . folic acid (FOLVITE) 235 MCG tablet Take 400 mcg by mouth daily.      . furosemide (LASIX) 40 MG tablet TAKE 1 TABLET DAILY  30 tablet  5  . Garlic 5732 MG CAPS Take 1 capsule by mouth daily.      Marland Kitchen KRILL OIL PO Take 1 capsule by mouth daily.      . Lutein 6 MG CAPS Take by mouth daily.        . mirabegron ER (MYRBETRIQ) 50 MG TB24 Take 1 tablet (50 mg  total) by mouth at bedtime.  30 tablet  11  . NIFEdipine (PROCARDIA-XL/ADALAT-CC/NIFEDICAL-XL) 30 MG 24 hr tablet TAKE 1 TABLET ONCE A DAY  30 tablet  5  . nitroGLYCERIN (NITROSTAT) 0.4 MG SL tablet Place 0.4 mg under the tongue every 5 (five) minutes as needed.        . polycarbophil (FIBERCON) 625 MG tablet Take 625 mg by mouth 2 (two) times daily.        Vladimir Faster Glycol-Propyl Glycol (SYSTANE) 0.4-0.3 % SOLN Apply 1 drop to eye 4 (four) times daily.      . prednisoLONE acetate (PRED FORTE) 1 % ophthalmic suspension Place 1 drop into the right eye 2 (two) times daily.      . vitamin E 400 UNIT capsule Take 400 Units by mouth daily.         No current facility-administered medications on file prior to visit.   Allergies  Allergen Reactions  . Keflex [Cephalexin]   . Penicillins Cross Reactors     Patient is allergic to ALL [facility] DRUGS!!!    There is no immunization history on file for this patient. Prior to Admission medications   Medication Sig Start Date End Date Taking? Authorizing Provider  alendronate (FOSAMAX) 70 MG tablet TAKE 1 TABLET ONCE A WEEK 10/26/13  Yes Vernie Shanks, MD  Ascorbic Acid (VITAMIN C) 1000 MG tablet  Take 1,000 mg by mouth daily.     Yes Historical Provider, MD  aspirin 81 MG chewable tablet Chew 81 mg by mouth daily.     Yes Historical Provider, MD  atenolol (TENORMIN) 25 MG tablet TAKE 1 TABLET ONCE A DAY 10/26/13  Yes Vernie Shanks, MD  B Complex-C-Min-Fe-FA (HEMATINIC PLUS COMPLEX PO) Take by mouth 2 (two) times daily.     Yes Historical Provider, MD  Cholecalciferol (VITAMIN D3) 10000 UNITS capsule Take 10,000 Units by mouth daily.     Yes Historical Provider, MD  co-enzyme Q-10 30 MG capsule Take 30 mg by mouth daily.   Yes Historical Provider, MD  docusate sodium (COLACE) 100 MG capsule Take 200 mg by mouth daily.   Yes Historical Provider, MD  donepezil (ARICEPT) 10 MG tablet Take 1 tablet (10 mg total) by mouth at bedtime as needed. 10/26/13  Yes Vernie Shanks, MD  ferrous sulfate 325 (65 FE) MG tablet Take 325 mg by mouth daily with breakfast.   Yes Historical Provider, MD  folic acid (FOLVITE) 308 MCG tablet Take 400 mcg by mouth daily.   Yes Historical Provider, MD  furosemide (LASIX) 40 MG tablet TAKE 1 TABLET DAILY 10/26/13  Yes Vernie Shanks, MD  Garlic 6578 MG CAPS Take 1 capsule by mouth daily.   Yes Historical Provider, MD  KRILL OIL PO Take 1 capsule by mouth daily.   Yes Historical Provider, MD  Lutein 6 MG CAPS Take by mouth daily.     Yes Historical Provider, MD  mirabegron ER (MYRBETRIQ) 50 MG TB24 Take 1 tablet (50 mg total) by mouth at bedtime. 03/08/13  Yes Florian Buff, MD  NIFEdipine (PROCARDIA-XL/ADALAT-CC/NIFEDICAL-XL) 30 MG 24 hr tablet TAKE 1 TABLET ONCE A DAY 10/26/13  Yes Vernie Shanks, MD  nitroGLYCERIN (NITROSTAT) 0.4 MG SL tablet Place 0.4 mg under the tongue every 5 (five) minutes as needed.     Yes Historical Provider, MD  polycarbophil (FIBERCON) 625 MG tablet Take 625 mg by mouth 2 (two) times daily.     Yes Historical Provider, MD  Polyethyl Glycol-Propyl Glycol (SYSTANE) 0.4-0.3 % SOLN Apply 1 drop to eye 4 (four) times daily.   Yes Historical  Provider, MD  prednisoLONE acetate (PRED FORTE) 1 % ophthalmic suspension Place 1 drop into the right eye 2 (two) times daily.   Yes Historical Provider, MD  vitamin E 400 UNIT capsule Take 400 Units by mouth daily.     Yes Historical Provider, MD     ROS: As above in the HPI. All other systems are stable or negative.  OBJECTIVE: APPEARANCE:  Patient in no acute distress.The patient appeared well nourished and normally developed. Acyanotic. Waist: VITAL SIGNS:BP 134/70  Pulse 112  Temp(Src) 97.4 F (36.3 C) (Oral)  Ht 5' 2"  (1.575 m)  Wt 131 lb 9.6 oz (59.693 kg)  BMI 24.06 kg/m2 WF  SKIN: warm and  Dry without overt rashes, tattoos and scars  HEAD and Neck: without JVD, Head and scalp: normal Eyes:No scleral icterus.no chages in her orbital findings. Ears: Auricle normal, canal normal, Tympanic membranes normal, insufflation normal. Nose: normal Throat: normal Neck & thyroid: normal  CHEST & LUNGS: Chest wall: normal Lungs: Clear  CVS: Reveals the PMI to be normally located. Regular rhythm, First and Second Heart sounds are normal,  absence of murmurs, rubs or gallops. Peripheral vasculature: Radial pulses: normal Dorsal pedis pulses: normal Posterior pulses: normal  ABDOMEN:  Appearance: normal Benign, no organomegaly, no masses, no Abdominal Aortic enlargement. No Guarding , no rebound. No Bruits. Bowel sounds: normal  RECTAL: N/A GU: N/A  EXTREMETIES: nonedematous.  MUSCULOSKELETAL:  Spine: normal Joints: right knee genu valgus with arthritic changes. ambulates with a walker  NEUROLOGIC: oriented to time,place and person; nonfocal. MMSE 27/30 Impairment lies in 0/3 on short term memory.   ASSESSMENT:  Unspecified essential hypertension - Plan: CMP14+EGFR  Encounter for long-term (current) use of other medications  Other and unspecified hyperlipidemia  Dementia with behavioral disturbance - Plan: CMP14+EGFR, TSH  CAD (coronary artery  disease) of artery bypass graft  Osteopenia  Hearing loss  PLAN: MMSE Patient competent with significant short term memory impairment. howver , is able to converse appropriately. The concerns of her daughters are real but it is difficult to convince patient otherwise.  Will add Namenda Xr to the regimen in the hope of preserving her memory. Could not convince her otherwise on having supervised care such as independent living.  Orders Placed This Encounter  Procedures  . CMP14+EGFR  . TSH   Meds ordered this encounter  Medications  . Memantine HCl ER (NAMENDA XR TITRATION PACK) 7 & 14 & 21 &28 MG CP24    Sig: Use starter pack as directed    Dispense:  1 capsule    Refill:  pack   There are no discontinued medications. Return in about 4 weeks (around 02/18/2014) for Recheck medical problems,.  Sue Schaefer P. Jacelyn Grip, M.D.  Arthritis and ambulates with a walker. Unsteady and  Deconditioned due to the winter. Needs PT to strengthen and reduce fall risk. Plan refer to PT>  Jourdin Gens P. Jacelyn Grip, M.D.

## 2014-01-22 ENCOUNTER — Telehealth: Payer: Self-pay | Admitting: Family Medicine

## 2014-01-22 LAB — CMP14+EGFR
ALT: 21 IU/L (ref 0–32)
AST: 33 IU/L (ref 0–40)
Albumin/Globulin Ratio: 2 (ref 1.1–2.5)
Albumin: 4.5 g/dL (ref 3.2–4.6)
Alkaline Phosphatase: 69 IU/L (ref 39–117)
BUN/Creatinine Ratio: 22 (ref 11–26)
BUN: 25 mg/dL (ref 10–36)
CO2: 20 mmol/L (ref 18–29)
Calcium: 9.5 mg/dL (ref 8.7–10.3)
Chloride: 100 mmol/L (ref 97–108)
Creatinine, Ser: 1.16 mg/dL — ABNORMAL HIGH (ref 0.57–1.00)
GFR calc Af Amer: 48 mL/min/{1.73_m2} — ABNORMAL LOW (ref >59)
GFR calc non Af Amer: 41 mL/min/{1.73_m2} — ABNORMAL LOW (ref >59)
Globulin, Total: 2.3 g/dL (ref 1.5–4.5)
Glucose: 93 mg/dL (ref 65–99)
Potassium: 4.2 mmol/L (ref 3.5–5.2)
Sodium: 139 mmol/L (ref 134–144)
Total Bilirubin: 0.4 mg/dL (ref 0.0–1.2)
Total Protein: 6.8 g/dL (ref 6.0–8.5)

## 2014-01-22 LAB — TSH: TSH: 1.02 u[IU]/mL (ref 0.450–4.500)

## 2014-01-23 ENCOUNTER — Telehealth: Payer: Self-pay | Admitting: *Deleted

## 2014-01-23 ENCOUNTER — Other Ambulatory Visit: Payer: Self-pay | Admitting: Family Medicine

## 2014-01-23 DIAGNOSIS — R29898 Other symptoms and signs involving the musculoskeletal system: Secondary | ICD-10-CM

## 2014-01-23 NOTE — Telephone Encounter (Signed)
Dr. Jacelyn Grip ins no longer covers nifedical but  Will cover amlodipine, nicardipineor verapamil er, will either of those work?  If not give  me some ammunition and I will set about to get it approved.  thanks

## 2014-01-24 ENCOUNTER — Other Ambulatory Visit: Payer: Self-pay | Admitting: Family Medicine

## 2014-01-24 MED ORDER — AMLODIPINE BESYLATE 5 MG PO TABS: 5.0000 mg | ORAL_TABLET | Freq: Every day | ORAL | Status: DC

## 2014-01-24 NOTE — Telephone Encounter (Signed)
Patient notified

## 2014-01-24 NOTE — Telephone Encounter (Signed)
Call patient : Her insurance no longer covers her BP medication. Prescription changed to amlodipine for her BP & sent to pharmacy in [place].insurance covers this. BP check with triage in 2 weeks.

## 2014-01-26 DIAGNOSIS — Z883 Allergy status to other anti-infective agents status: Secondary | ICD-10-CM | POA: Diagnosis not present

## 2014-01-26 DIAGNOSIS — H5440 Blindness, one eye, unspecified eye: Secondary | ICD-10-CM | POA: Diagnosis not present

## 2014-01-26 DIAGNOSIS — I2510 Atherosclerotic heart disease of native coronary artery without angina pectoris: Secondary | ICD-10-CM | POA: Diagnosis not present

## 2014-01-26 DIAGNOSIS — E785 Hyperlipidemia, unspecified: Secondary | ICD-10-CM | POA: Diagnosis not present

## 2014-01-26 DIAGNOSIS — I209 Angina pectoris, unspecified: Secondary | ICD-10-CM | POA: Diagnosis not present

## 2014-01-26 DIAGNOSIS — Z7982 Long term (current) use of aspirin: Secondary | ICD-10-CM | POA: Diagnosis not present

## 2014-01-26 DIAGNOSIS — I10 Essential (primary) hypertension: Secondary | ICD-10-CM | POA: Diagnosis not present

## 2014-01-26 DIAGNOSIS — Z880 Allergy status to penicillin: Secondary | ICD-10-CM | POA: Diagnosis not present

## 2014-01-26 DIAGNOSIS — Z79899 Other long term (current) drug therapy: Secondary | ICD-10-CM | POA: Diagnosis not present

## 2014-01-26 DIAGNOSIS — R079 Chest pain, unspecified: Secondary | ICD-10-CM | POA: Diagnosis not present

## 2014-01-26 DIAGNOSIS — Z882 Allergy status to sulfonamides status: Secondary | ICD-10-CM | POA: Diagnosis not present

## 2014-01-27 DIAGNOSIS — I2510 Atherosclerotic heart disease of native coronary artery without angina pectoris: Secondary | ICD-10-CM | POA: Diagnosis not present

## 2014-01-28 DIAGNOSIS — R079 Chest pain, unspecified: Secondary | ICD-10-CM | POA: Diagnosis not present

## 2014-01-30 DIAGNOSIS — I359 Nonrheumatic aortic valve disorder, unspecified: Secondary | ICD-10-CM | POA: Diagnosis not present

## 2014-01-30 DIAGNOSIS — I7090 Unspecified atherosclerosis: Secondary | ICD-10-CM | POA: Diagnosis not present

## 2014-01-30 DIAGNOSIS — I10 Essential (primary) hypertension: Secondary | ICD-10-CM | POA: Diagnosis not present

## 2014-01-30 DIAGNOSIS — R079 Chest pain, unspecified: Secondary | ICD-10-CM | POA: Diagnosis not present

## 2014-01-30 DIAGNOSIS — I491 Atrial premature depolarization: Secondary | ICD-10-CM | POA: Diagnosis not present

## 2014-02-01 ENCOUNTER — Encounter: Payer: Self-pay | Admitting: Family Medicine

## 2014-02-01 ENCOUNTER — Ambulatory Visit (INDEPENDENT_AMBULATORY_CARE_PROVIDER_SITE_OTHER): Payer: Medicare Other | Admitting: Family Medicine

## 2014-02-01 VITALS — BP 133/73 | HR 68 | Temp 97.0°F | Ht 62.0 in | Wt 130.0 lb

## 2014-02-01 DIAGNOSIS — R350 Frequency of micturition: Secondary | ICD-10-CM

## 2014-02-01 DIAGNOSIS — I25810 Atherosclerosis of coronary artery bypass graft(s) without angina pectoris: Secondary | ICD-10-CM

## 2014-02-01 DIAGNOSIS — IMO0001 Reserved for inherently not codable concepts without codable children: Secondary | ICD-10-CM

## 2014-02-01 DIAGNOSIS — I10 Essential (primary) hypertension: Secondary | ICD-10-CM

## 2014-02-01 DIAGNOSIS — Z79899 Other long term (current) drug therapy: Secondary | ICD-10-CM

## 2014-02-01 DIAGNOSIS — M949 Disorder of cartilage, unspecified: Secondary | ICD-10-CM

## 2014-02-01 DIAGNOSIS — E785 Hyperlipidemia, unspecified: Secondary | ICD-10-CM

## 2014-02-01 DIAGNOSIS — M8580 Other specified disorders of bone density and structure, unspecified site: Secondary | ICD-10-CM

## 2014-02-01 DIAGNOSIS — F0391 Unspecified dementia with behavioral disturbance: Secondary | ICD-10-CM

## 2014-02-01 DIAGNOSIS — F03918 Unspecified dementia, unspecified severity, with other behavioral disturbance: Secondary | ICD-10-CM

## 2014-02-01 DIAGNOSIS — M899 Disorder of bone, unspecified: Secondary | ICD-10-CM

## 2014-02-01 LAB — POCT UA - MICROSCOPIC ONLY
Bacteria, U Microscopic: NEGATIVE
Casts, Ur, LPF, POC: NEGATIVE
Crystals, Ur, HPF, POC: NEGATIVE
Mucus, UA: NEGATIVE
RBC, urine, microscopic: NEGATIVE
WBC, Ur, HPF, POC: NEGATIVE
Yeast, UA: NEGATIVE

## 2014-02-01 LAB — POCT URINALYSIS DIPSTICK
Bilirubin, UA: NEGATIVE
Blood, UA: NEGATIVE
Glucose, UA: NEGATIVE
Ketones, UA: NEGATIVE
Leukocytes, UA: NEGATIVE
Nitrite, UA: NEGATIVE
Protein, UA: NEGATIVE
Spec Grav, UA: 1.02
Urobilinogen, UA: NEGATIVE
pH, UA: 5

## 2014-02-01 NOTE — Progress Notes (Signed)
Patient ID: Sue Schaefer, female   DOB: 1920/10/03, 80 y.o.   MRN: [id number] SUBJECTIVE: CC: Chief Complaint  Patient presents with  . Hospitalization Follow-up    went to [facility] last saturday for chest pain  accompanied with daughter Vaughan Basta     HPI: Was admitted at [facility] for chest pain and dehydration. She was upset all week about the recommendation for independent living.then the chest pressure occurred. After evaluated at [facility] and admitted. Then she saw her cardiologist at [place] and evaluation  Reviewed in [place].   Past Medical History  Diagnosis Date  . Hypertension   . URI (upper respiratory infection)   . Anemia   . Memory loss   . Osteoporosis   . Vitamin D deficiency    No past surgical history on file. History   Social History  . Marital Status: Widowed    Spouse Name: N/A    Number of Children: N/A  . Years of Education: N/A   Occupational History  . Not on file.   Social History Main Topics  . Smoking status: Never Smoker   . Smokeless tobacco: Never Used  . Alcohol Use: No  . Drug Use: No  . Sexual Activity: Not on file   Other Topics Concern  . Not on file   Social History Narrative  . No narrative on file   No family history on file. Current Outpatient Prescriptions on File Prior to Visit  Medication Sig Dispense Refill  . alendronate (FOSAMAX) 70 MG tablet TAKE 1 TABLET ONCE A WEEK  4 tablet  5  . amLODipine (NORVASC) 5 MG tablet Take 1 tablet (5 mg total) by mouth daily.  30 tablet  3  . aspirin 81 MG chewable tablet Chew 81 mg by mouth daily.        Marland Kitchen atenolol (TENORMIN) 25 MG tablet TAKE 1 TABLET ONCE A DAY  30 tablet  5  . B Complex-C-Min-Fe-FA (HEMATINIC PLUS COMPLEX PO) Take by mouth 2 (two) times daily.        . Cholecalciferol (VITAMIN D3) 10000 UNITS capsule Take 10,000 Units by mouth daily.        Marland Kitchen co-enzyme Q-10 30 MG capsule Take 30 mg by mouth daily.      Marland Kitchen docusate sodium (COLACE) 100 MG  capsule Take 200 mg by mouth daily.      Marland Kitchen donepezil (ARICEPT) 10 MG tablet Take 1 tablet (10 mg total) by mouth at bedtime as needed.  30 tablet  5  . ferrous sulfate 325 (65 FE) MG tablet Take 325 mg by mouth daily with breakfast.      . folic acid (FOLVITE) 063 MCG tablet Take 400 mcg by mouth daily.      . furosemide (LASIX) 40 MG tablet TAKE 1 TABLET DAILY  30 tablet  5  . Garlic 0160 MG CAPS Take 1 capsule by mouth daily.      Marland Kitchen KRILL OIL PO Take 1 capsule by mouth daily.      . Lutein 6 MG CAPS Take by mouth daily.        . Memantine HCl ER (NAMENDA XR TITRATION PACK) 7 & 14 & 21 &28 MG CP24 Use starter pack as directed  1 capsule  pack  . mirabegron ER (MYRBETRIQ) 50 MG TB24 Take 1 tablet (50 mg total) by mouth at bedtime.  30 tablet  11  . nitroGLYCERIN (NITROSTAT) 0.4 MG SL tablet Place 0.4 mg under the tongue  every 5 (five) minutes as needed.        . polycarbophil (FIBERCON) 625 MG tablet Take 625 mg by mouth 2 (two) times daily.        Vladimir Faster Glycol-Propyl Glycol (SYSTANE) 0.4-0.3 % SOLN Apply 1 drop to eye 4 (four) times daily.      . prednisoLONE acetate (PRED FORTE) 1 % ophthalmic suspension Place 1 drop into the right eye 2 (two) times daily.      . vitamin E 400 UNIT capsule Take 400 Units by mouth daily.        . Ascorbic Acid (VITAMIN C) 1000 MG tablet Take 1,000 mg by mouth daily.         No current facility-administered medications on file prior to visit.   Allergies  Allergen Reactions  . Keflex [Cephalexin]   . Penicillins Cross Reactors     Patient is allergic to ALL [facility] DRUGS!!!    There is no immunization history on file for this patient. Prior to Admission medications   Medication Sig Start Date End Date Taking? Authorizing Provider  alendronate (FOSAMAX) 70 MG tablet TAKE 1 TABLET ONCE A WEEK 10/26/13  Yes Vernie Shanks, MD  amLODipine (NORVASC) 5 MG tablet Take 1 tablet (5 mg total) by mouth daily. 01/24/14  Yes Vernie Shanks, MD  aspirin 81 MG  chewable tablet Chew 81 mg by mouth daily.     Yes Historical Provider, MD  atenolol (TENORMIN) 25 MG tablet TAKE 1 TABLET ONCE A DAY 10/26/13  Yes Vernie Shanks, MD  B Complex-C-Min-Fe-FA (HEMATINIC PLUS COMPLEX PO) Take by mouth 2 (two) times daily.     Yes Historical Provider, MD  Cholecalciferol (VITAMIN D3) 10000 UNITS capsule Take 10,000 Units by mouth daily.     Yes Historical Provider, MD  co-enzyme Q-10 30 MG capsule Take 30 mg by mouth daily.   Yes Historical Provider, MD  docusate sodium (COLACE) 100 MG capsule Take 200 mg by mouth daily.   Yes Historical Provider, MD  donepezil (ARICEPT) 10 MG tablet Take 1 tablet (10 mg total) by mouth at bedtime as needed. 10/26/13  Yes Vernie Shanks, MD  ferrous sulfate 325 (65 FE) MG tablet Take 325 mg by mouth daily with breakfast.   Yes Historical Provider, MD  folic acid (FOLVITE) 409 MCG tablet Take 400 mcg by mouth daily.   Yes Historical Provider, MD  furosemide (LASIX) 40 MG tablet TAKE 1 TABLET DAILY 10/26/13  Yes Vernie Shanks, MD  Garlic 7353 MG CAPS Take 1 capsule by mouth daily.   Yes Historical Provider, MD  isosorbide mononitrate (IMDUR) 30 MG 24 hr tablet  01/27/14  Yes Historical Provider, MD  KLOR-CON M20 20 MEQ tablet  01/27/14  Yes Historical Provider, MD  KRILL OIL PO Take 1 capsule by mouth daily.   Yes Historical Provider, MD  Lutein 6 MG CAPS Take by mouth daily.     Yes Historical Provider, MD  Memantine HCl ER (NAMENDA XR TITRATION PACK) 7 & 14 & 21 &28 MG CP24 Use starter pack as directed 01/21/14  Yes Vernie Shanks, MD  mirabegron ER (MYRBETRIQ) 50 MG TB24 Take 1 tablet (50 mg total) by mouth at bedtime. 03/08/13  Yes Florian Buff, MD  NIFEDICAL XL 30 MG 24 hr tablet  12/03/13  Yes Historical Provider, MD  nitroGLYCERIN (NITROSTAT) 0.4 MG SL tablet Place 0.4 mg under the tongue every 5 (five) minutes as needed.  Yes Historical Provider, MD  polycarbophil (FIBERCON) 625 MG tablet Take 625 mg by mouth 2 (two) times daily.      Yes Historical Provider, MD  Polyethyl Glycol-Propyl Glycol (SYSTANE) 0.4-0.3 % SOLN Apply 1 drop to eye 4 (four) times daily.   Yes Historical Provider, MD  prednisoLONE acetate (PRED FORTE) 1 % ophthalmic suspension Place 1 drop into the right eye 2 (two) times daily.   Yes Historical Provider, MD  vitamin E 400 UNIT capsule Take 400 Units by mouth daily.     Yes Historical Provider, MD  Ascorbic Acid (VITAMIN C) 1000 MG tablet Take 1,000 mg by mouth daily.      Historical Provider, MD     ROS: As above in the HPI. All other systems are stable or negative.  OBJECTIVE: APPEARANCE:  Patient in no acute distress.The patient appeared well nourished and normally developed. Acyanotic. Waist: VITAL SIGNS:BP 133/73  Pulse 68  Temp(Src) 97 F (36.1 C) (Oral)  Ht 5\' 2"  (1.575 m)  Wt 130 lb (58.968 kg)  BMI 23.77 kg/m2   SKIN: warm and  Dry without overt rashes, tattoos and scars  HEAD and Neck: without JVD, Head and scalp: normal Eyes:No scleral icterus. Fundi normal, eye movements normal. Ears: Auricle normal, canal normal, Tympanic membranes normal, insufflation normal. Nose: normal Throat: normal Neck & thyroid: normal  CHEST & LUNGS: Chest wall: normal Lungs: Clear  CVS: Reveals the PMI to be normally located. Regular rhythm, First and Second Heart sounds are normal,  absence of murmurs, rubs or gallops. Peripheral vasculature: Radial pulses: normal Dorsal pedis pulses: normal Posterior pulses: normal  ABDOMEN:  Appearance: normal Benign, no organomegaly, no masses, no Abdominal Aortic enlargement. No Guarding , no rebound. No Bruits. Bowel sounds: normal  RECTAL: N/A GU: N/A  EXTREMETIES: nonedematous.  MUSCULOSKELETAL:  Spine: normal Joints: intact  NEUROLOGIC: oriented to time,place and person; nonfocal. Strength is normal Sensory is normal Reflexes are normal Cranial Nerves are normal.  ASSESSMENT: Frequency - Plan: POCT UA - Microscopic Only,  POCT urinalysis dipstick  Unspecified essential hypertension  Other and unspecified hyperlipidemia  Encounter for long-term (current) use of other medications  Dementia with behavioral disturbance  CAD (coronary artery disease) of artery bypass graft  Osteopenia  PLAN: Patient to contact her cardiologist on Monday about her work up including the echocardiogram with a suspicion for vegetation. This has been asymptomatic. Hold on physical therapy until cleared for  This by the cardiologist. Possibility that she had an ischemic episode. Discussed with patient and daughter Vaughan Basta.  Orders Placed This Encounter  Procedures  . POCT UA - Microscopic Only  . POCT urinalysis dipstick   Meds ordered this encounter  Medications  . isosorbide mononitrate (IMDUR) 30 MG 24 hr tablet    Sig:   . NIFEDICAL XL 30 MG 24 hr tablet    Sig:   . KLOR-CON M20 20 MEQ tablet    Sig:    There are no discontinued medications. Return for keep follow up. Already scheduled.  Johanne Mcglade P. Jacelyn Grip, M.D.

## 2014-02-01 NOTE — Progress Notes (Signed)
Quick Note:  Call patient. Labs normal. No change in plan. ______ 

## 2014-02-04 ENCOUNTER — Ambulatory Visit: Payer: Medicare Other | Admitting: Physical Therapy

## 2014-02-11 ENCOUNTER — Telehealth: Payer: Self-pay | Admitting: Family Medicine

## 2014-02-13 ENCOUNTER — Telehealth: Payer: Self-pay | Admitting: *Deleted

## 2014-02-13 NOTE — Telephone Encounter (Signed)
Dr Jola Schmidt H please address_ Pt has an appt on Tuesday 02/19/14- she wonders if she stil needs to keep this appt since she was here recently. Please call Glenda with info

## 2014-02-13 NOTE — Telephone Encounter (Signed)
Patient is taking Khlor Con, Amlodipine, and Namenda daily.  Patient wants to know if it is ok to take acetaminophen 500mg  one to two pills daily with these other meds. Her liver functions are WNL and I don't see why there would be problem taking Tylenol. She can double check her pharmacist if she has concerns about taking tylenol with these other meds.   Spoke with patient directly and left a detailed message on her daughter, Lindwood Qua, voicemail.

## 2014-02-15 NOTE — Telephone Encounter (Signed)
Pt is going to keep appt.  rs

## 2014-02-17 NOTE — Telephone Encounter (Signed)
ok 

## 2014-02-19 ENCOUNTER — Ambulatory Visit (INDEPENDENT_AMBULATORY_CARE_PROVIDER_SITE_OTHER): Payer: Medicare Other | Admitting: Family Medicine

## 2014-02-19 ENCOUNTER — Encounter: Payer: Self-pay | Admitting: Family Medicine

## 2014-02-19 VITALS — BP 125/54 | HR 67 | Temp 97.7°F | Ht 62.0 in | Wt 133.2 lb

## 2014-02-19 DIAGNOSIS — F0391 Unspecified dementia with behavioral disturbance: Secondary | ICD-10-CM

## 2014-02-19 DIAGNOSIS — R82998 Other abnormal findings in urine: Secondary | ICD-10-CM | POA: Diagnosis not present

## 2014-02-19 DIAGNOSIS — R8290 Unspecified abnormal findings in urine: Secondary | ICD-10-CM

## 2014-02-19 DIAGNOSIS — R350 Frequency of micturition: Secondary | ICD-10-CM

## 2014-02-19 DIAGNOSIS — I25810 Atherosclerosis of coronary artery bypass graft(s) without angina pectoris: Secondary | ICD-10-CM

## 2014-02-19 DIAGNOSIS — IMO0001 Reserved for inherently not codable concepts without codable children: Secondary | ICD-10-CM

## 2014-02-19 DIAGNOSIS — M899 Disorder of bone, unspecified: Secondary | ICD-10-CM

## 2014-02-19 DIAGNOSIS — E785 Hyperlipidemia, unspecified: Secondary | ICD-10-CM

## 2014-02-19 DIAGNOSIS — M8580 Other specified disorders of bone density and structure, unspecified site: Secondary | ICD-10-CM

## 2014-02-19 DIAGNOSIS — I10 Essential (primary) hypertension: Secondary | ICD-10-CM | POA: Diagnosis not present

## 2014-02-19 DIAGNOSIS — M949 Disorder of cartilage, unspecified: Secondary | ICD-10-CM

## 2014-02-19 DIAGNOSIS — H9190 Unspecified hearing loss, unspecified ear: Secondary | ICD-10-CM

## 2014-02-19 DIAGNOSIS — Z79899 Other long term (current) drug therapy: Secondary | ICD-10-CM

## 2014-02-19 DIAGNOSIS — F03918 Unspecified dementia, unspecified severity, with other behavioral disturbance: Secondary | ICD-10-CM

## 2014-02-19 LAB — POCT URINALYSIS DIPSTICK
Bilirubin, UA: NEGATIVE
Blood, UA: NEGATIVE
Glucose, UA: NEGATIVE
Ketones, UA: NEGATIVE
Nitrite, UA: NEGATIVE
Spec Grav, UA: 1.01
Urobilinogen, UA: NEGATIVE
pH, UA: 6.5

## 2014-02-19 LAB — POCT UA - MICROSCOPIC ONLY
Casts, Ur, LPF, POC: NEGATIVE
Crystals, Ur, HPF, POC: NEGATIVE
Mucus, UA: NEGATIVE
RBC, urine, microscopic: 1.3
Yeast, UA: NEGATIVE

## 2014-02-19 MED ORDER — MEMANTINE HCL ER 28 MG PO CP24: 28.0000 mg | ORAL_CAPSULE | Freq: Every day | ORAL | Status: DC

## 2014-02-19 NOTE — Progress Notes (Signed)
Patient ID: Sue Schaefer, female   DOB: [birth year], 80 y.o.   MRN: [id number] SUBJECTIVE: CC: Chief Complaint  Patient presents with  . Follow-up    1 month follow up refill namenda    HPI: DEMENTIA: the daughters see that the patient is calmer and a little more cognizant with the Namenda and the aricept and would like to have her continue these medications. No side effects.  She had seen the cardiologist and they plan to repeat the echocardiogram in 3 + months. They do not think the vegetations on the echo is real. Per the daughter/.  Other medical problems stable  Past Medical History  Diagnosis Date  . Hypertension   . URI (upper respiratory infection)   . Anemia   . Memory loss   . Osteoporosis   . Vitamin D deficiency    No past surgical history on file. History   Social History  . Marital Status: Widowed    Spouse Name: N/A    Number of Children: N/A  . Years of Education: N/A   Occupational History  . Not on file.   Social History Main Topics  . Smoking status: Never Smoker   . Smokeless tobacco: Never Used  . Alcohol Use: No  . Drug Use: No  . Sexual Activity: Not on file   Other Topics Concern  . Not on file   Social History Narrative  . No narrative on file   No family history on file. Current Outpatient Prescriptions on File Prior to Visit  Medication Sig Dispense Refill  . alendronate (FOSAMAX) 70 MG tablet TAKE 1 TABLET ONCE A WEEK  4 tablet  5  . amLODipine (NORVASC) 5 MG tablet Take 1 tablet (5 mg total) by mouth daily.  30 tablet  3  . Ascorbic Acid (VITAMIN C) 1000 MG tablet Take 1,000 mg by mouth daily.        Marland Kitchen aspirin 81 MG chewable tablet Chew 81 mg by mouth daily.        Marland Kitchen atenolol (TENORMIN) 25 MG tablet TAKE 1 TABLET ONCE A DAY  30 tablet  5  . B Complex-C-Min-Fe-FA (HEMATINIC PLUS COMPLEX PO) Take by mouth 2 (two) times daily.        Marland Kitchen co-enzyme Q-10 30 MG capsule Take 30 mg by mouth daily.      Marland Kitchen docusate sodium (COLACE) 100 MG  capsule Take 200 mg by mouth daily.      Marland Kitchen donepezil (ARICEPT) 10 MG tablet Take 1 tablet (10 mg total) by mouth at bedtime as needed.  30 tablet  5  . ferrous sulfate 325 (65 FE) MG tablet Take 325 mg by mouth daily with breakfast.      . folic acid (FOLVITE) 197 MCG tablet Take 400 mcg by mouth daily.      . furosemide (LASIX) 40 MG tablet TAKE 1 TABLET DAILY  30 tablet  5  . Garlic 5883 MG CAPS Take 1 capsule by mouth daily.      . isosorbide mononitrate (IMDUR) 30 MG 24 hr tablet       . KLOR-CON M20 20 MEQ tablet       . KRILL OIL PO Take 1 capsule by mouth daily.      . Lutein 6 MG CAPS Take by mouth daily.        . mirabegron ER (MYRBETRIQ) 50 MG TB24 Take 1 tablet (50 mg total) by mouth at bedtime.  30 tablet  11  . NIFEDICAL  XL 30 MG 24 hr tablet       . polycarbophil (FIBERCON) 625 MG tablet Take 625 mg by mouth 2 (two) times daily.        Vladimir Faster Glycol-Propyl Glycol (SYSTANE) 0.4-0.3 % SOLN Apply 1 drop to eye 4 (four) times daily.      . prednisoLONE acetate (PRED FORTE) 1 % ophthalmic suspension Place 1 drop into the right eye 2 (two) times daily.      . vitamin E 400 UNIT capsule Take 400 Units by mouth daily.        . Cholecalciferol (VITAMIN D3) 10000 UNITS capsule Take 10,000 Units by mouth daily.        . nitroGLYCERIN (NITROSTAT) 0.4 MG SL tablet Place 0.4 mg under the tongue every 5 (five) minutes as needed.         No current facility-administered medications on file prior to visit.   Allergies  Allergen Reactions  . Erythromycin Base Other (See Comments)    mycins"- "burned all the way through"  . Keflex [Cephalexin]   . Other Itching    Mycins made patient itch  . Penicillins Cross Reactors     Patient is allergic to ALL MYCINS DRUGS!!!  . Sulfamethoxazole Rash    unknown    There is no immunization history on file for this patient. Prior to Admission medications   Medication Sig Start Date End Date Taking? Authorizing Provider  Acai Berry 500 MG CAPS  Take 1 capsule by mouth.   Yes Historical Provider, MD  alendronate (FOSAMAX) 70 MG tablet TAKE 1 TABLET ONCE A WEEK 10/26/13  Yes Vernie Shanks, MD  amLODipine (NORVASC) 5 MG tablet Take 1 tablet (5 mg total) by mouth daily. 01/24/14  Yes Vernie Shanks, MD  Ascorbic Acid (VITAMIN C) 1000 MG tablet Take 1,000 mg by mouth daily.     Yes Historical Provider, MD  aspirin 81 MG chewable tablet Chew 81 mg by mouth daily.     Yes Historical Provider, MD  atenolol (TENORMIN) 25 MG tablet TAKE 1 TABLET ONCE A DAY 10/26/13  Yes Vernie Shanks, MD  B Complex-C-Min-Fe-FA (HEMATINIC PLUS COMPLEX PO) Take by mouth 2 (two) times daily.     Yes Historical Provider, MD  Calcium-Magnesium-Zinc [phone] MG TABS Take 1 tablet by mouth.   Yes Historical Provider, MD  Calcium-Vitamin D 600-200 MG-UNIT per tablet Take 1 tablet by mouth. 03/18/08  Yes Historical Provider, MD  co-enzyme Q-10 30 MG capsule Take 30 mg by mouth daily.   Yes Historical Provider, MD  docusate sodium (COLACE) 100 MG capsule Take 200 mg by mouth daily.   Yes Historical Provider, MD  donepezil (ARICEPT) 10 MG tablet Take 1 tablet (10 mg total) by mouth at bedtime as needed. 10/26/13  Yes Vernie Shanks, MD  ferrous sulfate 325 (65 FE) MG tablet Take 325 mg by mouth daily with breakfast.   Yes Historical Provider, MD  folic acid (FOLVITE) 324 MCG tablet Take 400 mcg by mouth daily.   Yes Historical Provider, MD  furosemide (LASIX) 40 MG tablet TAKE 1 TABLET DAILY 10/26/13  Yes Vernie Shanks, MD  Garlic 4010 MG CAPS Take 1 capsule by mouth daily.   Yes Historical Provider, MD  isosorbide mononitrate (IMDUR) 30 MG 24 hr tablet  01/27/14  Yes Historical Provider, MD  KLOR-CON M20 20 MEQ tablet  01/27/14  Yes Historical Provider, MD  KRILL OIL PO Take 1 capsule by mouth daily.  Yes Historical Provider, MD  Lutein 6 MG CAPS Take by mouth daily.     Yes Historical Provider, MD  Memantine HCl ER (NAMENDA XR) 28 MG CP24 Take 28 mg by mouth daily.  02/19/14  Yes Vernie Shanks, MD  mirabegron ER (MYRBETRIQ) 50 MG TB24 Take 1 tablet (50 mg total) by mouth at bedtime. 03/08/13  Yes Florian Buff, MD  Multiple Vitamin (MULTIVITAMIN) capsule Take 4 capsules by mouth. 03/18/08  Yes Historical Provider, MD  NIFEDICAL XL 30 MG 24 hr tablet  12/03/13  Yes Historical Provider, MD  polycarbophil (FIBERCON) 625 MG tablet Take 625 mg by mouth 2 (two) times daily.     Yes Historical Provider, MD  Polyethyl Glycol-Propyl Glycol (SYSTANE) 0.4-0.3 % SOLN Apply 1 drop to eye 4 (four) times daily.   Yes Historical Provider, MD  prednisoLONE acetate (PRED FORTE) 1 % ophthalmic suspension Place 1 drop into the right eye 2 (two) times daily.   Yes Historical Provider, MD  Turmeric (RA TURMERIC) 500 MG CAPS Take 1 capsule by mouth.   Yes Historical Provider, MD  vitamin E 400 UNIT capsule Take 400 Units by mouth daily.     Yes Historical Provider, MD  Cholecalciferol (VITAMIN D3) 10000 UNITS capsule Take 10,000 Units by mouth daily.      Historical Provider, MD  nitroGLYCERIN (NITROSTAT) 0.4 MG SL tablet Place 0.4 mg under the tongue every 5 (five) minutes as needed.      Historical Provider, MD  Omega-3 1000 MG CAPS Take 1 capsule by mouth. 11/30/05   Historical Provider, MD     ROS: As above in the HPI. All other systems are stable or negative.  OBJECTIVE: APPEARANCE:  Patient in no acute distress.The patient appeared well nourished and normally developed. Acyanotic. Waist: VITAL SIGNS:BP 125/54  Pulse 67  Temp(Src) 97.7 F (36.5 C) (Oral)  Ht 5\' 2"  (1.575 m)  Wt 133 lb 3.2 oz (60.419 kg)  BMI 24.36 kg/m2 WF  SKIN: warm and  Dry without overt rashes, tattoos and scars  HEAD and Neck: without JVD, Head and scalp: normal Eyes:No scleral icterus, eye movements abnormal. Left eye functional right eye blindness. Ears: Auricle normal, canal normal, Tympanic membranes normal, insufflation normal. Nose: normal Throat: normal Neck & thyroid:  normal  CHEST & LUNGS: Chest wall: normal Lungs: Clear  CVS: Reveals the PMI to be normally located. Regular rhythm, First and Second Heart sounds are normal,  absence of murmurs, rubs or gallops. Peripheral vasculature: Radial pulses: normal Dorsal pedis pulses: normal Posterior pulses: normal  ABDOMEN:  Appearance: normal Benign, no organomegaly, no masses, no Abdominal Aortic enlargement. No Guarding , no rebound. No Bruits. Bowel sounds: normal  RECTAL: N/A GU: N/A  EXTREMETIES: nonedematous.  MUSCULOSKELETAL:  Spine: mild kyphosis Joints arthritic of the hands.  NEUROLOGIC: oriented to place and person; nonfocal. Ambulates with a rolling walker.  Results for orders placed in visit on 02/19/14  POCT URINALYSIS DIPSTICK      Result Value Ref Range   Color, UA gold     Clarity, UA clear     Glucose, UA negative     Bilirubin, UA negative     Ketones, UA negative     Spec Grav, UA 1.010     Blood, UA negative     pH, UA 6.5     Protein, UA 4+     Urobilinogen, UA negative     Nitrite, UA negative     Leukocytes, UA large (  3+)       ASSESSMENT: Frequency - Plan: POCT urinalysis dipstick, POCT UA - Microscopic Only  Unspecified essential hypertension  Other and unspecified hyperlipidemia  Encounter for long-term (current) use of other medications  CAD (coronary artery disease) of artery bypass graft  Dementia with behavioral disturbance - Plan: Memantine HCl ER (NAMENDA XR) 28 MG CP24  Osteopenia  Hearing loss  Abnormal urine - Plan: Urine culture  PLAN: Keep active , healthy plant based  Diet. Orders Placed This Encounter  Procedures  . Urine culture  . POCT urinalysis dipstick  . POCT UA - Microscopic Only   Meds ordered this encounter  Medications  . Acai Berry 500 MG CAPS    Sig: Take 1 capsule by mouth.  . Calcium-Vitamin D 600-200 MG-UNIT per tablet    Sig: Take 1 tablet by mouth.  . Calcium-Magnesium-Zinc 333-133-5 MG TABS     Sig: Take 1 tablet by mouth.  . DISCONTD: Garlic 123XX123 MG CAPS    Sig: Take 1 capsule by mouth.  . DISCONTD: memantine (NAMENDA) 10 MG tablet    Sig: Take by mouth.  . Multiple Vitamin (MULTIVITAMIN) capsule    Sig: Take 4 capsules by mouth.  . Omega-3 1000 MG CAPS    Sig: Take 1 capsule by mouth.  . Turmeric (RA TURMERIC) 500 MG CAPS    Sig: Take 1 capsule by mouth.  . DISCONTD: Memantine HCl ER (NAMENDA XR) 28 MG CP24    Sig: Take by mouth.  . Memantine HCl ER (NAMENDA XR) 28 MG CP24    Sig: Take 28 mg by mouth daily.    Dispense:  30 capsule    Refill:  11   Medications Discontinued During This Encounter  Medication Reason  . memantine (NAMENDA) 10 MG tablet Duplicate  . Garlic [id number] MG CAPS Duplicate  . Memantine HCl ER (NAMENDA XR TITRATION PACK) 7 & 14 & 21 &28 MG CP24 Completed Course  . Memantine HCl ER (NAMENDA XR) 28 MG CP24 Reorder   Return in about 3 months (around 05/21/2014) for Recheck medical problems.  Eliyahu Bille P. Jacelyn Grip, M.D.

## 2014-02-21 LAB — URINE CULTURE

## 2014-02-21 NOTE — Progress Notes (Signed)
Quick Note:  Call patient. LabsUrine culture was unremarkable No change in plan. ______

## 2014-03-21 ENCOUNTER — Other Ambulatory Visit: Payer: Self-pay | Admitting: Obstetrics & Gynecology

## 2014-04-08 ENCOUNTER — Ambulatory Visit (INDEPENDENT_AMBULATORY_CARE_PROVIDER_SITE_OTHER): Payer: Medicare Other | Admitting: Family Medicine

## 2014-04-08 ENCOUNTER — Ambulatory Visit (INDEPENDENT_AMBULATORY_CARE_PROVIDER_SITE_OTHER): Payer: Medicare Other

## 2014-04-08 ENCOUNTER — Encounter: Payer: Self-pay | Admitting: Family Medicine

## 2014-04-08 VITALS — BP 144/68 | HR 106 | Temp 96.7°F | Ht 62.0 in | Wt 130.0 lb

## 2014-04-08 DIAGNOSIS — M25579 Pain in unspecified ankle and joints of unspecified foot: Secondary | ICD-10-CM

## 2014-04-08 DIAGNOSIS — S82899A Other fracture of unspecified lower leg, initial encounter for closed fracture: Secondary | ICD-10-CM

## 2014-04-08 DIAGNOSIS — W19XXXA Unspecified fall, initial encounter: Secondary | ICD-10-CM

## 2014-04-08 DIAGNOSIS — S82891A Other fracture of right lower leg, initial encounter for closed fracture: Secondary | ICD-10-CM

## 2014-04-08 DIAGNOSIS — I25810 Atherosclerosis of coronary artery bypass graft(s) without angina pectoris: Secondary | ICD-10-CM

## 2014-04-08 DIAGNOSIS — M25571 Pain in right ankle and joints of right foot: Secondary | ICD-10-CM

## 2014-04-08 NOTE — Progress Notes (Signed)
   Subjective:    Patient ID: Sue Schaefer, female    DOB: 1919-11-13, 80 y.o.   MRN: [id number]  HPI C/o right ankle tenderness and swelling.  She was walking house w/o walker and twisted her ankle. She has been falling more often according to her daughter.     Review of Systems No chest pain, SOB, HA, dizziness, vision change, N/V, diarrhea, constipation, dysuria, urinary urgency or frequency, myalgias, arthralgias or rash.     Objective:   Physical Exam  Vital signs noted   Elderly female in NAD with walker  HEENT - Head atraumatic Normocephalic Respiratory - Lungs CTA bilateral Cardiac - RRR S1 and S2 without murmur MS - Right ankle with swelling and tenderness right lateral malleolus.  XRAY Right ankle - No fx Lysbeth Penner FNP    Assessment & Plan:  Right ankle pain - Plan: DG Ankle Complete Right, Ambulatory referral to Physical Therapy Take aleve otc for next 5-7 days.  Falls - Plan: Ambulatory referral to Physical Therapy  Lysbeth Penner FNP

## 2014-04-09 ENCOUNTER — Other Ambulatory Visit: Payer: Self-pay | Admitting: Family Medicine

## 2014-04-09 DIAGNOSIS — S82891A Other fracture of right lower leg, initial encounter for closed fracture: Secondary | ICD-10-CM

## 2014-04-09 NOTE — Addendum Note (Signed)
Addended by: Marin Olp on: 04/09/2014 08:48 AM   Modules accepted: Orders

## 2014-04-25 ENCOUNTER — Other Ambulatory Visit: Payer: Self-pay | Admitting: Orthopedic Surgery

## 2014-04-25 ENCOUNTER — Other Ambulatory Visit: Payer: Self-pay | Admitting: *Deleted

## 2014-04-25 ENCOUNTER — Ambulatory Visit (INDEPENDENT_AMBULATORY_CARE_PROVIDER_SITE_OTHER): Payer: Medicare Other

## 2014-04-25 DIAGNOSIS — S82409A Unspecified fracture of shaft of unspecified fibula, initial encounter for closed fracture: Secondary | ICD-10-CM

## 2014-04-25 MED ORDER — ISOSORBIDE MONONITRATE ER 30 MG PO TB24: 30.0000 mg | ORAL_TABLET | Freq: Every day | ORAL | Status: DC

## 2014-04-30 DIAGNOSIS — I059 Rheumatic mitral valve disease, unspecified: Secondary | ICD-10-CM | POA: Diagnosis not present

## 2014-04-30 DIAGNOSIS — I369 Nonrheumatic tricuspid valve disorder, unspecified: Secondary | ICD-10-CM | POA: Diagnosis not present

## 2014-05-14 ENCOUNTER — Ambulatory Visit: Payer: Medicare Other | Attending: Orthopedic Surgery | Admitting: Physical Therapy

## 2014-05-14 ENCOUNTER — Telehealth: Payer: Self-pay | Admitting: Family Medicine

## 2014-05-14 DIAGNOSIS — Z9861 Coronary angioplasty status: Secondary | ICD-10-CM | POA: Insufficient documentation

## 2014-05-14 DIAGNOSIS — M25569 Pain in unspecified knee: Secondary | ICD-10-CM | POA: Insufficient documentation

## 2014-05-14 DIAGNOSIS — S82899A Other fracture of unspecified lower leg, initial encounter for closed fracture: Secondary | ICD-10-CM | POA: Diagnosis not present

## 2014-05-14 DIAGNOSIS — M1990 Unspecified osteoarthritis, unspecified site: Secondary | ICD-10-CM | POA: Insufficient documentation

## 2014-05-14 DIAGNOSIS — X58XXXA Exposure to other specified factors, initial encounter: Secondary | ICD-10-CM | POA: Diagnosis not present

## 2014-05-14 DIAGNOSIS — IMO0001 Reserved for inherently not codable concepts without codable children: Secondary | ICD-10-CM | POA: Insufficient documentation

## 2014-05-14 DIAGNOSIS — F0280 Dementia in other diseases classified elsewhere without behavioral disturbance: Secondary | ICD-10-CM | POA: Diagnosis not present

## 2014-05-14 DIAGNOSIS — R5381 Other malaise: Secondary | ICD-10-CM | POA: Insufficient documentation

## 2014-05-14 DIAGNOSIS — M899 Disorder of bone, unspecified: Secondary | ICD-10-CM | POA: Diagnosis not present

## 2014-05-14 DIAGNOSIS — G309 Alzheimer's disease, unspecified: Secondary | ICD-10-CM | POA: Diagnosis not present

## 2014-05-14 DIAGNOSIS — M25669 Stiffness of unspecified knee, not elsewhere classified: Secondary | ICD-10-CM | POA: Insufficient documentation

## 2014-05-14 DIAGNOSIS — M949 Disorder of cartilage, unspecified: Secondary | ICD-10-CM

## 2014-05-14 NOTE — Telephone Encounter (Signed)
appt given for Friday with [facility]

## 2014-05-16 DIAGNOSIS — M1710 Unilateral primary osteoarthritis, unspecified knee: Secondary | ICD-10-CM | POA: Diagnosis not present

## 2014-05-17 ENCOUNTER — Encounter: Payer: Self-pay | Admitting: Family Medicine

## 2014-05-17 ENCOUNTER — Ambulatory Visit (INDEPENDENT_AMBULATORY_CARE_PROVIDER_SITE_OTHER): Payer: Medicare Other | Admitting: Family Medicine

## 2014-05-17 VITALS — BP 138/78 | HR 106 | Temp 97.4°F | Ht 60.0 in | Wt 131.6 lb

## 2014-05-17 DIAGNOSIS — F0390 Unspecified dementia without behavioral disturbance: Secondary | ICD-10-CM | POA: Diagnosis not present

## 2014-05-17 DIAGNOSIS — I25810 Atherosclerosis of coronary artery bypass graft(s) without angina pectoris: Secondary | ICD-10-CM | POA: Diagnosis not present

## 2014-05-17 MED ORDER — CITALOPRAM HYDROBROMIDE 10 MG PO TABS: 10.0000 mg | ORAL_TABLET | Freq: Every day | ORAL | Status: DC

## 2014-05-17 NOTE — Progress Notes (Signed)
   Subjective:    Patient ID: Sue Schaefer, female    DOB: 1920-08-02, 80 y.o.   MRN: [id number]  HPI This 80 y.o. female presents for evaluation of memory problems and worsening dementia sx's. She is accompanied by family who state she was put on namenda and has been taking this for some Time and now her memory is worsening.  She is not having any behavioral problems.   Review of Systems  C/o Memory problems No chest pain, SOB, HA, dizziness, vision change, N/V, diarrhea, constipation, dysuria, urinary urgency or frequency, myalgias, arthralgias or rash.     Objective:   Physical Exam  Vital signs noted  Elderly female in NAD.  HEENT - Head atraumatic Normocephalic Respiratory - Lungs CTA bilateral Cardiac - RRR S1 and S2 without murmur GI - Abdomen soft Nontender and bowel sounds active x 4       Assessment & Plan:  Dementia, without behavioral disturbance - Plan: citalopram (CELEXA) 10 MG tablet Po qd #30 w/11 rf.  See if this helps.  Follow up in one month. Lysbeth Penner FNP

## 2014-05-20 ENCOUNTER — Other Ambulatory Visit: Payer: Self-pay | Admitting: *Deleted

## 2014-05-20 DIAGNOSIS — M8580 Other specified disorders of bone density and structure, unspecified site: Secondary | ICD-10-CM

## 2014-05-20 MED ORDER — ALENDRONATE SODIUM 70 MG PO TABS: ORAL_TABLET | ORAL | Status: DC

## 2014-05-21 ENCOUNTER — Ambulatory Visit: Payer: Medicare Other | Admitting: *Deleted

## 2014-05-21 DIAGNOSIS — M25569 Pain in unspecified knee: Secondary | ICD-10-CM | POA: Diagnosis not present

## 2014-05-21 DIAGNOSIS — R5381 Other malaise: Secondary | ICD-10-CM | POA: Diagnosis not present

## 2014-05-21 DIAGNOSIS — F0280 Dementia in other diseases classified elsewhere without behavioral disturbance: Secondary | ICD-10-CM | POA: Diagnosis not present

## 2014-05-21 DIAGNOSIS — IMO0001 Reserved for inherently not codable concepts without codable children: Secondary | ICD-10-CM | POA: Diagnosis not present

## 2014-05-21 DIAGNOSIS — G309 Alzheimer's disease, unspecified: Secondary | ICD-10-CM | POA: Diagnosis not present

## 2014-05-21 DIAGNOSIS — M25669 Stiffness of unspecified knee, not elsewhere classified: Secondary | ICD-10-CM | POA: Diagnosis not present

## 2014-05-21 DIAGNOSIS — S82899A Other fracture of unspecified lower leg, initial encounter for closed fracture: Secondary | ICD-10-CM | POA: Diagnosis not present

## 2014-05-23 ENCOUNTER — Encounter: Payer: Medicare Other | Admitting: *Deleted

## 2014-05-27 ENCOUNTER — Ambulatory Visit: Payer: Medicare Other | Admitting: Physical Therapy

## 2014-05-27 DIAGNOSIS — M25569 Pain in unspecified knee: Secondary | ICD-10-CM | POA: Diagnosis not present

## 2014-05-27 DIAGNOSIS — S82899A Other fracture of unspecified lower leg, initial encounter for closed fracture: Secondary | ICD-10-CM | POA: Diagnosis not present

## 2014-05-27 DIAGNOSIS — R5381 Other malaise: Secondary | ICD-10-CM | POA: Diagnosis not present

## 2014-05-27 DIAGNOSIS — M25669 Stiffness of unspecified knee, not elsewhere classified: Secondary | ICD-10-CM | POA: Diagnosis not present

## 2014-05-27 DIAGNOSIS — G309 Alzheimer's disease, unspecified: Secondary | ICD-10-CM | POA: Diagnosis not present

## 2014-05-27 DIAGNOSIS — IMO0001 Reserved for inherently not codable concepts without codable children: Secondary | ICD-10-CM | POA: Diagnosis not present

## 2014-05-27 DIAGNOSIS — F0280 Dementia in other diseases classified elsewhere without behavioral disturbance: Secondary | ICD-10-CM | POA: Diagnosis not present

## 2014-05-30 ENCOUNTER — Encounter: Payer: Medicare Other | Admitting: *Deleted

## 2014-06-05 ENCOUNTER — Ambulatory Visit: Payer: Medicare Other | Admitting: Physical Therapy

## 2014-06-05 DIAGNOSIS — G309 Alzheimer's disease, unspecified: Secondary | ICD-10-CM | POA: Diagnosis not present

## 2014-06-05 DIAGNOSIS — IMO0001 Reserved for inherently not codable concepts without codable children: Secondary | ICD-10-CM | POA: Diagnosis not present

## 2014-06-05 DIAGNOSIS — M25569 Pain in unspecified knee: Secondary | ICD-10-CM | POA: Diagnosis not present

## 2014-06-05 DIAGNOSIS — M25669 Stiffness of unspecified knee, not elsewhere classified: Secondary | ICD-10-CM | POA: Diagnosis not present

## 2014-06-05 DIAGNOSIS — F0280 Dementia in other diseases classified elsewhere without behavioral disturbance: Secondary | ICD-10-CM | POA: Diagnosis not present

## 2014-06-05 DIAGNOSIS — R5381 Other malaise: Secondary | ICD-10-CM | POA: Diagnosis not present

## 2014-06-05 DIAGNOSIS — S82899A Other fracture of unspecified lower leg, initial encounter for closed fracture: Secondary | ICD-10-CM | POA: Diagnosis not present

## 2014-06-11 ENCOUNTER — Encounter: Payer: Medicare Other | Admitting: *Deleted

## 2014-06-11 ENCOUNTER — Telehealth: Payer: Self-pay | Admitting: *Deleted

## 2014-06-11 DIAGNOSIS — R2689 Other abnormalities of gait and mobility: Secondary | ICD-10-CM

## 2014-06-11 NOTE — Telephone Encounter (Signed)
Pt has impaired mobility and gait Wheelchair for better ambulation is ordered Needs appt for face to face appt scheduled

## 2014-06-13 ENCOUNTER — Ambulatory Visit: Payer: Medicare Other | Attending: Orthopedic Surgery | Admitting: *Deleted

## 2014-06-13 DIAGNOSIS — M899 Disorder of bone, unspecified: Secondary | ICD-10-CM | POA: Diagnosis not present

## 2014-06-13 DIAGNOSIS — M25669 Stiffness of unspecified knee, not elsewhere classified: Secondary | ICD-10-CM | POA: Insufficient documentation

## 2014-06-13 DIAGNOSIS — IMO0001 Reserved for inherently not codable concepts without codable children: Secondary | ICD-10-CM | POA: Insufficient documentation

## 2014-06-13 DIAGNOSIS — M1990 Unspecified osteoarthritis, unspecified site: Secondary | ICD-10-CM | POA: Insufficient documentation

## 2014-06-13 DIAGNOSIS — R5381 Other malaise: Secondary | ICD-10-CM | POA: Diagnosis not present

## 2014-06-13 DIAGNOSIS — Z9861 Coronary angioplasty status: Secondary | ICD-10-CM | POA: Insufficient documentation

## 2014-06-13 DIAGNOSIS — G309 Alzheimer's disease, unspecified: Secondary | ICD-10-CM | POA: Diagnosis not present

## 2014-06-13 DIAGNOSIS — M25569 Pain in unspecified knee: Secondary | ICD-10-CM | POA: Insufficient documentation

## 2014-06-13 DIAGNOSIS — M949 Disorder of cartilage, unspecified: Secondary | ICD-10-CM | POA: Diagnosis not present

## 2014-06-13 DIAGNOSIS — F0280 Dementia in other diseases classified elsewhere without behavioral disturbance: Secondary | ICD-10-CM | POA: Insufficient documentation

## 2014-06-14 ENCOUNTER — Ambulatory Visit (INDEPENDENT_AMBULATORY_CARE_PROVIDER_SITE_OTHER): Payer: Medicare Other | Admitting: Family Medicine

## 2014-06-14 ENCOUNTER — Encounter: Payer: Self-pay | Admitting: Family Medicine

## 2014-06-14 VITALS — BP 116/58 | HR 107 | Temp 97.8°F | Ht 60.0 in | Wt 131.0 lb

## 2014-06-14 DIAGNOSIS — R269 Unspecified abnormalities of gait and mobility: Secondary | ICD-10-CM

## 2014-06-14 DIAGNOSIS — I25810 Atherosclerosis of coronary artery bypass graft(s) without angina pectoris: Secondary | ICD-10-CM

## 2014-06-14 NOTE — Progress Notes (Signed)
   Subjective:    Patient ID: Sue Schaefer, female    DOB: 1920-03-13, 80 y.o.   MRN: [id number]  HPI  This 80 y.o. female presents for evaluation of difficulty with gait and walking.  She is using walker and she is in PT for ambulation and gait training.  She has been getting worse with her walking with her walker and shuffles and has more difficulty according to patient and family.  Review of Systems C/o gait disturbance   No chest pain, SOB, HA, dizziness, vision change, N/V, diarrhea, constipation, dysuria, urinary urgency or frequency, myalgias, arthralgias or rash.  Objective:   Physical Exam  Vital signs noted  Well developed well nourished female.  HEENT - Head atraumatic Normocephalic                Eyes - PERRLA, Conjuctiva - clear Sclera- Clear EOMI                Ears - EAC's Wnl TM's Wnl Gross Hearing WNL                Throat - oropharanx wnl Respiratory - Lungs CTA bilateral Cardiac - RRR S1 and S2 without murmur GI - Abdomen soft Nontender and bowel sounds active x 4 MS - Gait is slow and difficulty with walker, shuffling gait, patient is able to use arms well her legs are Are shuffling and she does not pick up her feet.      Assessment & Plan:  Gait disturbance Recommend WC for when her gait is worse,  Keep trying to maintain with PT and with supervision during ambulation but will need a wheelchair when not able to be supervised due to safety.  Lysbeth Penner FNP

## 2014-06-19 ENCOUNTER — Encounter: Payer: Medicare Other | Admitting: Physical Therapy

## 2014-06-20 ENCOUNTER — Ambulatory Visit: Payer: Medicare Other | Admitting: Physical Therapy

## 2014-06-20 DIAGNOSIS — IMO0001 Reserved for inherently not codable concepts without codable children: Secondary | ICD-10-CM | POA: Diagnosis not present

## 2014-06-24 ENCOUNTER — Ambulatory Visit: Payer: Medicare Other | Admitting: Physical Therapy

## 2014-06-24 DIAGNOSIS — IMO0001 Reserved for inherently not codable concepts without codable children: Secondary | ICD-10-CM | POA: Diagnosis not present

## 2014-06-26 ENCOUNTER — Ambulatory Visit: Payer: Medicare Other | Admitting: Physical Therapy

## 2014-06-26 DIAGNOSIS — IMO0001 Reserved for inherently not codable concepts without codable children: Secondary | ICD-10-CM | POA: Diagnosis not present

## 2014-06-28 ENCOUNTER — Telehealth: Payer: Self-pay | Admitting: Family Medicine

## 2014-06-28 NOTE — Telephone Encounter (Signed)
Pt has been seen in office for gait disturbance on 06/14/14. If gait has become worse since then pt NTBS. Pt needs to be careful and prevent any falls.

## 2014-07-01 NOTE — Telephone Encounter (Signed)
Left message to call back and schedule appt or speak to nurse for clarification of symptoms and requests.

## 2014-07-02 ENCOUNTER — Encounter: Payer: Medicare Other | Admitting: *Deleted

## 2014-07-04 ENCOUNTER — Ambulatory Visit: Payer: Medicare Other | Admitting: *Deleted

## 2014-07-04 DIAGNOSIS — IMO0001 Reserved for inherently not codable concepts without codable children: Secondary | ICD-10-CM | POA: Diagnosis not present

## 2014-07-09 ENCOUNTER — Other Ambulatory Visit: Payer: Self-pay | Admitting: *Deleted

## 2014-07-09 ENCOUNTER — Ambulatory Visit: Payer: Medicare Other | Attending: Orthopedic Surgery | Admitting: *Deleted

## 2014-07-09 DIAGNOSIS — M949 Disorder of cartilage, unspecified: Secondary | ICD-10-CM

## 2014-07-09 DIAGNOSIS — M1990 Unspecified osteoarthritis, unspecified site: Secondary | ICD-10-CM | POA: Insufficient documentation

## 2014-07-09 DIAGNOSIS — M25569 Pain in unspecified knee: Secondary | ICD-10-CM | POA: Insufficient documentation

## 2014-07-09 DIAGNOSIS — G309 Alzheimer's disease, unspecified: Secondary | ICD-10-CM | POA: Diagnosis not present

## 2014-07-09 DIAGNOSIS — M899 Disorder of bone, unspecified: Secondary | ICD-10-CM | POA: Diagnosis not present

## 2014-07-09 DIAGNOSIS — R5381 Other malaise: Secondary | ICD-10-CM | POA: Insufficient documentation

## 2014-07-09 DIAGNOSIS — M25669 Stiffness of unspecified knee, not elsewhere classified: Secondary | ICD-10-CM | POA: Diagnosis not present

## 2014-07-09 DIAGNOSIS — IMO0001 Reserved for inherently not codable concepts without codable children: Secondary | ICD-10-CM | POA: Diagnosis not present

## 2014-07-09 DIAGNOSIS — F0280 Dementia in other diseases classified elsewhere without behavioral disturbance: Secondary | ICD-10-CM | POA: Diagnosis not present

## 2014-07-09 DIAGNOSIS — Z9861 Coronary angioplasty status: Secondary | ICD-10-CM | POA: Insufficient documentation

## 2014-07-09 MED ORDER — POTASSIUM CHLORIDE CRYS ER 20 MEQ PO TBCR: 20.0000 meq | EXTENDED_RELEASE_TABLET | Freq: Once | ORAL | Status: DC

## 2014-07-16 ENCOUNTER — Encounter: Payer: Medicare Other | Admitting: *Deleted

## 2014-07-18 ENCOUNTER — Ambulatory Visit: Payer: Medicare Other | Admitting: Physical Therapy

## 2014-07-18 DIAGNOSIS — R5381 Other malaise: Secondary | ICD-10-CM | POA: Diagnosis not present

## 2014-07-18 DIAGNOSIS — M25669 Stiffness of unspecified knee, not elsewhere classified: Secondary | ICD-10-CM | POA: Diagnosis not present

## 2014-07-18 DIAGNOSIS — M25569 Pain in unspecified knee: Secondary | ICD-10-CM | POA: Diagnosis not present

## 2014-07-18 DIAGNOSIS — F0280 Dementia in other diseases classified elsewhere without behavioral disturbance: Secondary | ICD-10-CM | POA: Diagnosis not present

## 2014-07-18 DIAGNOSIS — IMO0001 Reserved for inherently not codable concepts without codable children: Secondary | ICD-10-CM | POA: Diagnosis not present

## 2014-07-22 ENCOUNTER — Ambulatory Visit: Payer: Medicare Other | Admitting: Physical Therapy

## 2014-07-22 DIAGNOSIS — M25669 Stiffness of unspecified knee, not elsewhere classified: Secondary | ICD-10-CM | POA: Diagnosis not present

## 2014-07-22 DIAGNOSIS — M25569 Pain in unspecified knee: Secondary | ICD-10-CM | POA: Diagnosis not present

## 2014-07-22 DIAGNOSIS — IMO0001 Reserved for inherently not codable concepts without codable children: Secondary | ICD-10-CM | POA: Diagnosis not present

## 2014-07-22 DIAGNOSIS — R5381 Other malaise: Secondary | ICD-10-CM | POA: Diagnosis not present

## 2014-07-22 DIAGNOSIS — F0280 Dementia in other diseases classified elsewhere without behavioral disturbance: Secondary | ICD-10-CM | POA: Diagnosis not present

## 2014-07-25 ENCOUNTER — Other Ambulatory Visit: Payer: Self-pay | Admitting: Family Medicine

## 2014-07-25 DIAGNOSIS — S43421S Sprain of right rotator cuff capsule, sequela: Secondary | ICD-10-CM

## 2014-07-30 ENCOUNTER — Ambulatory Visit: Payer: Medicare Other | Admitting: Physical Therapy

## 2014-08-01 ENCOUNTER — Ambulatory Visit: Payer: Medicare Other | Admitting: Physical Therapy

## 2014-08-01 DIAGNOSIS — Z23 Encounter for immunization: Secondary | ICD-10-CM | POA: Diagnosis not present

## 2014-08-13 ENCOUNTER — Other Ambulatory Visit: Payer: Self-pay | Admitting: Family Medicine

## 2014-08-24 ENCOUNTER — Other Ambulatory Visit: Payer: Self-pay | Admitting: Family

## 2014-09-13 DIAGNOSIS — I129 Hypertensive chronic kidney disease with stage 1 through stage 4 chronic kidney disease, or unspecified chronic kidney disease: Secondary | ICD-10-CM | POA: Diagnosis not present

## 2014-09-13 DIAGNOSIS — I079 Rheumatic tricuspid valve disease, unspecified: Secondary | ICD-10-CM | POA: Diagnosis not present

## 2014-09-13 DIAGNOSIS — N183 Chronic kidney disease, stage 3 (moderate): Secondary | ICD-10-CM | POA: Diagnosis not present

## 2014-09-13 DIAGNOSIS — I7090 Unspecified atherosclerosis: Secondary | ICD-10-CM | POA: Diagnosis not present

## 2014-09-16 ENCOUNTER — Other Ambulatory Visit: Payer: Self-pay | Admitting: Family Medicine

## 2014-10-09 ENCOUNTER — Other Ambulatory Visit: Payer: Self-pay | Admitting: Family Medicine

## 2014-10-10 ENCOUNTER — Telehealth: Payer: Self-pay | Admitting: Family Medicine

## 2014-10-10 NOTE — Telephone Encounter (Signed)
Appointment scheduled for 12/18 with [place] at 3:15

## 2014-10-10 NOTE — Telephone Encounter (Signed)
Left patient's daughter a voicemail to call back and schedule a follow up appointment.

## 2014-10-25 ENCOUNTER — Ambulatory Visit (INDEPENDENT_AMBULATORY_CARE_PROVIDER_SITE_OTHER): Payer: Medicare Other | Admitting: Family Medicine

## 2014-10-25 ENCOUNTER — Encounter: Payer: Self-pay | Admitting: Family Medicine

## 2014-10-25 VITALS — BP 146/67 | HR 78 | Temp 97.4°F | Ht 60.0 in | Wt 131.2 lb

## 2014-10-25 DIAGNOSIS — I2583 Coronary atherosclerosis due to lipid rich plaque: Secondary | ICD-10-CM

## 2014-10-25 DIAGNOSIS — F03918 Unspecified dementia, unspecified severity, with other behavioral disturbance: Secondary | ICD-10-CM

## 2014-10-25 DIAGNOSIS — I2510 Atherosclerotic heart disease of native coronary artery without angina pectoris: Secondary | ICD-10-CM

## 2014-10-25 DIAGNOSIS — F0391 Unspecified dementia with behavioral disturbance: Secondary | ICD-10-CM

## 2014-10-25 DIAGNOSIS — I25810 Atherosclerosis of coronary artery bypass graft(s) without angina pectoris: Secondary | ICD-10-CM

## 2014-10-25 DIAGNOSIS — I10 Essential (primary) hypertension: Secondary | ICD-10-CM | POA: Diagnosis not present

## 2014-10-25 MED ORDER — DOCUSATE SODIUM 100 MG PO CAPS: 200.0000 mg | ORAL_CAPSULE | Freq: Every day | ORAL | Status: DC

## 2014-10-25 MED ORDER — ALENDRONATE SODIUM 70 MG PO TABS: ORAL_TABLET | ORAL | Status: DC

## 2014-10-25 MED ORDER — DONEPEZIL HCL 10 MG PO TABS: 10.0000 mg | ORAL_TABLET | Freq: Every evening | ORAL | Status: DC | PRN

## 2014-10-25 MED ORDER — ATENOLOL 25 MG PO TABS: ORAL_TABLET | ORAL | Status: DC

## 2014-10-25 MED ORDER — FUROSEMIDE 40 MG PO TABS: ORAL_TABLET | ORAL | Status: DC

## 2014-10-25 MED ORDER — MIRABEGRON ER 50 MG PO TB24
50.0000 mg | ORAL_TABLET | Freq: Every day | ORAL | Status: DC
Start: 2014-10-25 — End: 2015-10-27

## 2014-10-25 NOTE — Progress Notes (Signed)
   Subjective:    Patient ID: Sue Schaefer, female    DOB: [birth year], 80 y.o.   MRN: [id number]  HPI Patient is here for follow up.  Review of Systems  Constitutional: Negative for fever.  HENT: Negative for ear pain.   Eyes: Negative for discharge.  Respiratory: Negative for cough.   Cardiovascular: Negative for chest pain.  Gastrointestinal: Negative for abdominal distention.  Endocrine: Negative for polyuria.  Genitourinary: Negative for difficulty urinating.  Musculoskeletal: Negative for gait problem and neck pain.  Skin: Negative for color change and rash.  Neurological: Negative for speech difficulty and headaches.  Psychiatric/Behavioral: Negative for agitation.       Objective:    BP 146/67 mmHg  Pulse 78  Temp(Src) 97.4 F (36.3 C) (Oral)  Ht 5' (1.524 m)  Wt 131 lb 3.2 oz (59.512 kg)  BMI 25.62 kg/m2 Physical Exam  Constitutional: She is oriented to person, place, and time. She appears well-developed and well-nourished.  HENT:  Head: Normocephalic and atraumatic.  Mouth/Throat: Oropharynx is clear and moist.  Eyes: Pupils are equal, round, and reactive to light.  Neck: Normal range of motion. Neck supple.  Cardiovascular: Normal rate and regular rhythm.   No murmur heard. Pulmonary/Chest: Effort normal and breath sounds normal.  Abdominal: Soft. Bowel sounds are normal. There is no tenderness.  Neurological: She is alert and oriented to person, place, and time.  Skin: Skin is warm and dry.  Psychiatric: She has a normal mood and affect.          Assessment & Plan:     ICD-9-CM ICD-10-CM   1. Essential hypertension, benign 401.1 I10 furosemide (LASIX) 40 MG tablet     POCT CBC     CMP14+EGFR  2. Dementia with behavioral disturbance 294.21 F03.91 donepezil (ARICEPT) 10 MG tablet  3. Coronary artery disease due to lipid rich plaque 414.00 I25.10 atenolol (TENORMIN) 25 MG tablet   414.3       No Follow-up on file.  Lysbeth Penner FNP

## 2014-10-30 ENCOUNTER — Telehealth: Payer: Self-pay | Admitting: Family Medicine

## 2014-10-30 NOTE — Telephone Encounter (Signed)
When you get back, daughter wants to discuss adding or changing mom's medicaton to help with memory,  Namenda?

## 2014-11-04 ENCOUNTER — Other Ambulatory Visit: Payer: Self-pay | Admitting: Family Medicine

## 2014-11-04 NOTE — Telephone Encounter (Signed)
NTBS.

## 2014-11-04 NOTE — Telephone Encounter (Signed)
Pt is currently taking Aricept and Namenda Daughter notified Verbalizes understanding

## 2014-11-19 ENCOUNTER — Other Ambulatory Visit: Payer: Self-pay | Admitting: Family Medicine

## 2014-12-09 DIAGNOSIS — Z09 Encounter for follow-up examination after completed treatment for conditions other than malignant neoplasm: Secondary | ICD-10-CM | POA: Diagnosis not present

## 2014-12-09 DIAGNOSIS — H3530 Unspecified macular degeneration: Secondary | ICD-10-CM | POA: Diagnosis not present

## 2014-12-09 DIAGNOSIS — I10 Essential (primary) hypertension: Secondary | ICD-10-CM | POA: Diagnosis not present

## 2014-12-09 DIAGNOSIS — T86841 Corneal transplant failure: Secondary | ICD-10-CM | POA: Diagnosis not present

## 2014-12-26 ENCOUNTER — Other Ambulatory Visit: Payer: Self-pay | Admitting: Family Medicine

## 2014-12-26 ENCOUNTER — Telehealth: Payer: Self-pay | Admitting: Family Medicine

## 2014-12-26 DIAGNOSIS — R2681 Unsteadiness on feet: Secondary | ICD-10-CM

## 2014-12-27 NOTE — Telephone Encounter (Signed)
This is okay to refer this patient for physical therapy for gait instability and lower extremity strengthening

## 2014-12-27 NOTE — Telephone Encounter (Signed)
Patient is requesting a referral for physical therapy for her gait and walking she was a patient of [facility]

## 2014-12-27 NOTE — Telephone Encounter (Signed)
Detailed message left that referral has been made and we will call them with an appointment.

## 2015-01-16 ENCOUNTER — Ambulatory Visit: Payer: Medicare Other | Attending: Family Medicine | Admitting: Physical Therapy

## 2015-01-16 DIAGNOSIS — R2681 Unsteadiness on feet: Secondary | ICD-10-CM | POA: Insufficient documentation

## 2015-01-16 NOTE — Therapy (Signed)
[facility] [place], [place], [address] Phone: [phone]   Fax:  [phone]  Physical Therapy Evaluation  Patient Details  Name: Sue Schaefer MRN: [id number] Date of Birth: 05-02-1920 Referring Provider:  Chipper Herb, MD  Encounter Date: 01/16/2015      PT End of Session - 01/16/15 1523    Visit Number 1   Number of Visits 18   Date for PT Re-Evaluation 03/13/15   PT Start Time 0246   PT Stop Time 0322   PT Time Calculation (min) 36 min   Activity Tolerance Patient tolerated treatment well   Behavior During Therapy [facility] for tasks assessed/performed      Past Medical History  Diagnosis Date  . Hypertension   . URI (upper respiratory infection)   . Anemia   . Memory loss   . Osteoporosis   . Vitamin D deficiency     No past surgical history on file.  There were no vitals filed for this visit.  Visit Diagnosis:  Gait instability - Plan: PT plan of care cert/re-cert      Subjective Assessment - 01/16/15 1500    Symptoms Been walking slower.   Patient Stated Goals Walk better.   Multiple Pain Sites No            [facility] PT Assessment - 01/16/15 0001    Assessment   Medical Diagnosis Gait Instability.   Onset Date --  Ongoing.   Precautions   Precautions --  Fall risk.   Balance Screen   Has the patient fallen in the past 6 months No   Has the patient had a decrease in activity level because of a fear of falling?  No   Is the patient reluctant to leave their home because of a fear of falling?  No   ROM / Strength   AROM / PROM / Strength --  WFL.   Ambulation/Gait   Ambulation/Gait Yes   Assistive device Rolling walker   Gait Comments Patient walks with right knee in valgum and recurvatum and right ankle eversion.   Standardized Balance Assessment   Standardized Balance Assessment Berg Balance Test   Berg Balance Test   Sit to Stand Able to stand  independently using hands   Standing  Unsupported Able to stand safely 2 minutes   Sitting with Back Unsupported but Feet Supported on Floor or Stool Able to sit safely and securely 2 minutes   Stand to Sit Controls descent by using hands   Transfers Able to transfer with verbal cueing and /or supervision   Standing Unsupported with Eyes Closed Able to stand 10 seconds safely   Standing Ubsupported with Feet Together Able to place feet together independently and stand for 1 minute with supervision   From Standing, Reach Forward with Outstretched Arm Can reach forward >12 cm safely (5")   From Standing Position, Pick up Object from Floor Unable to pick up shoe, but reaches 2-5 cm (1-2") from shoe and balances independently   From Standing Position, Turn to Look Behind Over each Shoulder Looks behind one side only/other side shows less weight shift   Turn 360 Degrees Needs close supervision or verbal cueing   Standing Unsupported, Alternately Place Feet on Step/Stool Able to complete >2 steps/needs minimal assist   Standing Unsupported, One Foot in Front Able to plae foot ahead of the other independently and hold 30 seconds   Standing on One Leg Unable to try or needs assist to  prevent fall   Total Score 36                             PT Short Term Goals - 02/08/2015 1527    PT SHORT TERM GOAL #1   Title STG's=LTG's.   Time 4           PT Long Term Goals - Feb 08, 2015 1528    PT LONG TERM GOAL #1   Title Independent with an advanced    Time 9   Period Weeks   Status New   PT LONG TERM GOAL #2   Title Improve Berg score by 4-5 points.   Time 9   Period Weeks   Status New               Plan - 08-Feb-2015 1521    Clinical Impression Statement The patient presented to the clinic today with her daughter.  They state that the patient was not been walking as good as she did previously.   Pt will benefit from skilled therapeutic intervention in order to improve on the following deficits Abnormal  gait;Decreased activity tolerance   Rehab Potential Good   PT Duration --  18 visits.   PT Next Visit Plan Core exercises--draw-ins, mini crunches, bilateral hip abduction.  Balance and gait training.          G-Codes - February 08, 2015 1538    Functional Assessment Tool Used Clinical Judement.   Functional Limitation Mobility: Walking and moving around   Mobility: Walking and Moving Around Current Status [phone]) At least 40 percent but less than 60 percent impaired, limited or restricted   Mobility: Walking and Moving Around Goal Status [phone]) At least 20 percent but less than 40 percent impaired, limited or restricted       Problem List Patient Active Problem List   Diagnosis Date Noted  . Encounter for long-term (current) use of other medications 04/03/2013  . Essential hypertension, benign 04/03/2013  . CAD (coronary artery disease) of artery bypass graft 04/03/2013  . Frequency 04/03/2013  . Other malaise and fatigue 04/03/2013  . Contusion, wrist 04/03/2013  . Fracture of phalanx of right little finger 04/03/2013  . Memory impairment 01/29/2013  . Dementia with behavioral disturbance 01/29/2013  . Unspecified essential hypertension 01/29/2013  . Other and unspecified hyperlipidemia 01/29/2013  . Hearing loss 01/29/2013  . Coronary artery disease 01/29/2013  . Osteopenia 01/29/2013    APPLEGATE, [place] MPT 02-08-2015, 3:43 PM  [facility] [address] [place], [place], [address] Phone: [phone]   Fax:  [phone]

## 2015-01-23 ENCOUNTER — Ambulatory Visit: Payer: Medicare Other | Admitting: Physical Therapy

## 2015-01-23 DIAGNOSIS — R2681 Unsteadiness on feet: Secondary | ICD-10-CM | POA: Diagnosis not present

## 2015-01-23 NOTE — Therapy (Signed)
[facility] [place], [place], [address] Phone: [phone]   Fax:  [phone]  Physical Therapy Treatment  Patient Details  Name: Sue Schaefer MRN: [id number] Date of Birth: [birth year] Referring Provider:  Chipper Herb, MD  Encounter Date: 01/23/2015      PT End of Session - 01/23/15 1654    Number of Visits 18   Date for PT Re-Evaluation 03/13/15   PT Stop Time 4503      Past Medical History  Diagnosis Date  . Hypertension   . URI (upper respiratory infection)   . Anemia   . Memory loss   . Osteoporosis   . Vitamin D deficiency     No past surgical history on file.  There were no vitals filed for this visit.  Visit Diagnosis:  Gait instability      Subjective Assessment - 01/23/15 1629    Symptoms Doing good.      Treatment:  Rockerboard, Airex and Bosu ball in parallel bars for balance x 12 minutes  Nustep Level 5 x 12 minutes                            PT Short Term Goals - 01/16/15 1527    PT SHORT TERM GOAL #1   Title STG's=LTG's.   Time 4           PT Long Term Goals - 01/16/15 1528    PT LONG TERM GOAL #1   Title Independent with an advanced    Time 9   Period Weeks   Status New   PT LONG TERM GOAL #2   Title Improve Berg score by 4-5 points.   Time 9   Period Weeks   Status New               Problem List Patient Active Problem List   Diagnosis Date Noted  . Encounter for long-term (current) use of other medications 04/03/2013  . Essential hypertension, benign 04/03/2013  . CAD (coronary artery disease) of artery bypass graft 04/03/2013  . Frequency 04/03/2013  . Other malaise and fatigue 04/03/2013  . Contusion, wrist 04/03/2013  . Fracture of phalanx of right little finger 04/03/2013  . Memory impairment 01/29/2013  . Dementia with behavioral disturbance 01/29/2013  . Unspecified essential hypertension 01/29/2013  . Other and  unspecified hyperlipidemia 01/29/2013  . Hearing loss 01/29/2013  . Coronary artery disease 01/29/2013  . Osteopenia 01/29/2013    Neeti Knudtson, [place]  MPT 01/23/2015, 4:59 PM  [facility] [address] [place], [place], [address] Phone: [phone]   Fax:  [phone]

## 2015-01-28 ENCOUNTER — Encounter: Payer: Self-pay | Admitting: *Deleted

## 2015-01-28 ENCOUNTER — Ambulatory Visit: Payer: Medicare Other | Admitting: *Deleted

## 2015-01-28 DIAGNOSIS — R2681 Unsteadiness on feet: Secondary | ICD-10-CM | POA: Diagnosis not present

## 2015-01-28 NOTE — Therapy (Signed)
[facility] [place], [place], [address] Phone: [phone]   Fax:  [phone]  Physical Therapy Treatment  Patient Details  Name: Statia Burdick MRN: [id number] Date of Birth: 1920/08/28 Referring Provider:  Chipper Herb, MD  Encounter Date: 01/28/2015      PT End of Session - 01/28/15 1621    Visit Number 3   Number of Visits 18   Date for PT Re-Evaluation 03/13/15   PT Start Time 3419   PT Stop Time 1605   PT Time Calculation (min) 50 min   Activity Tolerance Patient tolerated treatment well   Behavior During Therapy [facility] for tasks assessed/performed      Past Medical History  Diagnosis Date  . Hypertension   . URI (upper respiratory infection)   . Anemia   . Memory loss   . Osteoporosis   . Vitamin D deficiency     History reviewed. No pertinent past surgical history.  There were no vitals filed for this visit.  Visit Diagnosis:  Gait instability      Subjective Assessment - 01/28/15 1528    Symptoms did good after   Patient Stated Goals Walk better. strengthen core   Currently in Pain? No/denies                       [facility] Adult PT Treatment/Exercise - 01/28/15 0001    Balance   Balance Assessed --   Dynamic Standing Balance   Dynamic Standing - Balance Activities Reaching across midline  with feet together, and with one step holds with RT/LT   Exercises   Exercises Knee/Hip;Lumbar   Lumbar Exercises: Supine   Ab Set 20 reps  Draw-ins   Bent Knee Raise 20 reps   Bridge 20 reps   Knee/Hip Exercises: Aerobic   Stationary Bike L1 x 15 mins   Knee/Hip Exercises: Standing   Rocker Board 5 minutes  PF/DF ROM and balance with CGA   Other Standing Knee Exercises Standing balance on Dyna disc with both feet with CGA for sevseral bouts                PT Education - 01/28/15 1746    Education provided Yes   Education Details core exs   Person(s) Educated Patient   Methods Demonstration;Explanation;Verbal cues;Handout   Comprehension Verbalized understanding;Returned demonstration;Verbal cues required          PT Short Term Goals - 01/16/15 1527    PT SHORT TERM GOAL #1   Title STG's=LTG's.   Time 4           PT Long Term Goals - 01/16/15 1528    PT LONG TERM GOAL #1   Title Independent with an advanced    Time 9   Period Weeks   Status New   PT LONG TERM GOAL #2   Title Improve Berg score by 4-5 points.   Time 9   Period Weeks   Status New               Plan - 01/28/15 1622    Clinical Impression Statement Pt did great with therex and balance act.'s   Pt will benefit from skilled therapeutic intervention in order to improve on the following deficits Abnormal gait;Decreased activity tolerance   Rehab Potential Good   PT Next Visit Plan Core exercises--draw-ins, mini crunches, bilateral hip abduction.  Balance and gait training.   Consulted and Agree with Plan of Care Patient  Problem List Patient Active Problem List   Diagnosis Date Noted  . Encounter for long-term (current) use of other medications 04/03/2013  . Essential hypertension, benign 04/03/2013  . CAD (coronary artery disease) of artery bypass graft 04/03/2013  . Frequency 04/03/2013  . Other malaise and fatigue 04/03/2013  . Contusion, wrist 04/03/2013  . Fracture of phalanx of right little finger 04/03/2013  . Memory impairment 01/29/2013  . Dementia with behavioral disturbance 01/29/2013  . Unspecified essential hypertension 01/29/2013  . Other and unspecified hyperlipidemia 01/29/2013  . Hearing loss 01/29/2013  . Coronary artery disease 01/29/2013  . Osteopenia 01/29/2013    Trejan Buda,CHRIS, PTA 01/28/2015, 5:47 PM  [facility] [address] [place], [place], [address] Phone: [phone]   Fax:  [phone]

## 2015-01-29 ENCOUNTER — Encounter: Payer: Medicare Other | Admitting: Physical Therapy

## 2015-01-30 ENCOUNTER — Encounter: Payer: Medicare Other | Admitting: *Deleted

## 2015-02-04 ENCOUNTER — Encounter: Payer: Medicare Other | Admitting: Physical Therapy

## 2015-02-06 ENCOUNTER — Ambulatory Visit: Payer: Medicare Other | Admitting: *Deleted

## 2015-02-06 ENCOUNTER — Encounter: Payer: Self-pay | Admitting: *Deleted

## 2015-02-06 DIAGNOSIS — R2681 Unsteadiness on feet: Secondary | ICD-10-CM | POA: Diagnosis not present

## 2015-02-06 NOTE — Therapy (Signed)
[facility] [place], [place], [address] Phone: [phone]   Fax:  [phone]  Physical Therapy Treatment  Patient Details  Name: Sue Schaefer MRN: [id number] Date of Birth: 09-Sep-1920 Referring Provider:  Chipper Herb, MD  Encounter Date: 02/06/2015    Past Medical History  Diagnosis Date  . Hypertension   . URI (upper respiratory infection)   . Anemia   . Memory loss   . Osteoporosis   . Vitamin D deficiency     History reviewed. No pertinent past surgical history.  There were no vitals filed for this visit.  Visit Diagnosis:  No diagnosis found.      Subjective Assessment - 02/06/15 1543    Symptoms feel good today. Getting around a little better   Patient Stated Goals Walk better. strengthen core                       [facility] Adult PT Treatment/Exercise - 02/06/15 0001    Dynamic Standing Balance   Dynamic Standing - Balance Activities Reaching across midline   on airex pad, and with feet together, and one step holds   Exercises   Exercises --   Knee/Hip Exercises: Aerobic   Stationary Bike L1 x 15 mins   Knee/Hip Exercises: Standing   Rocker Board 5 minutes  PF/DF balance with CGA   Other Standing Knee Exercises Standing balance on Dyna disc with both feet with CGA for sevseral bouts. Anklle  Ankle exs all motions each foot                  PT Short Term Goals - 01/16/15 1527    PT SHORT TERM GOAL #1   Title STG's=LTG's.   Time 4           PT Long Term Goals - 01/16/15 1528    PT LONG TERM GOAL #1   Title Independent with an advanced    Time 9   Period Weeks   Status New   PT LONG TERM GOAL #2   Title Improve Berg score by 4-5 points.   Time 9   Period Weeks   Status New               Problem List Patient Active Problem List   Diagnosis Date Noted  . Encounter for long-term (current) use of other medications 04/03/2013  . Essential  hypertension, benign 04/03/2013  . CAD (coronary artery disease) of artery bypass graft 04/03/2013  . Frequency 04/03/2013  . Other malaise and fatigue 04/03/2013  . Contusion, wrist 04/03/2013  . Fracture of phalanx of right little finger 04/03/2013  . Memory impairment 01/29/2013  . Dementia with behavioral disturbance 01/29/2013  . Unspecified essential hypertension 01/29/2013  . Other and unspecified hyperlipidemia 01/29/2013  . Hearing loss 01/29/2013  . Coronary artery disease 01/29/2013  . Osteopenia 01/29/2013    Gelila Well,CHRIS, PTA 02/06/2015, 4:44 PM  [facility] [address] [place], [place], [address] Phone: [phone]   Fax:  [phone]

## 2015-02-11 ENCOUNTER — Encounter: Payer: Self-pay | Admitting: *Deleted

## 2015-02-11 ENCOUNTER — Ambulatory Visit: Payer: Medicare Other | Attending: Family Medicine | Admitting: *Deleted

## 2015-02-11 DIAGNOSIS — R2681 Unsteadiness on feet: Secondary | ICD-10-CM | POA: Insufficient documentation

## 2015-02-11 NOTE — Therapy (Signed)
[facility] [place], [place], [address] Phone: [phone]   Fax:  [phone]  Physical Therapy Treatment  Patient Details  Name: Sue Schaefer MRN: [id number] Date of Birth: 20-Oct-1920 Referring Provider:  Chipper Herb, MD  Encounter Date: 02/11/2015      PT End of Session - 02/11/15 1639    Visit Number 4   Number of Visits 18   Date for PT Re-Evaluation 03/13/15   PT Start Time 1620   PT Stop Time 1708   PT Time Calculation (min) 48 min      Past Medical History  Diagnosis Date  . Hypertension   . URI (upper respiratory infection)   . Anemia   . Memory loss   . Osteoporosis   . Vitamin D deficiency     History reviewed. No pertinent past surgical history.  There were no vitals filed for this visit.  Visit Diagnosis:  Gait instability      Subjective Assessment - 02/11/15 1641    Subjective feel good today. Getting around a little better   Patient Stated Goals Walk better. strengthen core                       [facility] Adult PT Treatment/Exercise - 02/11/15 0001    Balance   Balance Assessed Yes   Dynamic Standing Balance   Dynamic Standing - Balance Activities Reaching across midline  with Pt. on airex balancepad, and with one step holds, CGA   Exercises   Exercises Knee/Hip;Lumbar   Knee/Hip Exercises: Aerobic   Stationary Bike L1 x 15 mins   Knee/Hip Exercises: Standing   Rocker Board 5 minutes  PF/DF , balance with CGA   Other Standing Knee Exercises Seated Ankle exs BIL.  on Dyna disc all motions  both ankles                  PT Short Term Goals - 01/16/15 1527    PT SHORT TERM GOAL #1   Title STG's=LTG's.   Time 4           PT Long Term Goals - 01/16/15 1528    PT LONG TERM GOAL #1   Title Independent with an advanced    Time 9   Period Weeks   Status New   PT LONG TERM GOAL #2   Title Improve Berg score by 4-5 points.   Time 9   Period Weeks   Status New               Plan - 02/11/15 1721    Clinical Impression Statement Pt. did fairly well with exs today, but had less energy today due to being on the go all day. Goals are ongoing   Pt will benefit from skilled therapeutic intervention in order to improve on the following deficits Abnormal gait;Decreased activity tolerance   Rehab Potential Good   PT Next Visit Plan Core exercises--draw-ins, mini crunches, bilateral hip abduction.  Balance and gait training.        Problem List Patient Active Problem List   Diagnosis Date Noted  . Encounter for long-term (current) use of other medications 04/03/2013  . Essential hypertension, benign 04/03/2013  . CAD (coronary artery disease) of artery bypass graft 04/03/2013  . Frequency 04/03/2013  . Other malaise and fatigue 04/03/2013  . Contusion, wrist 04/03/2013  . Fracture of phalanx of right little finger 04/03/2013  . Memory impairment 01/29/2013  . Dementia  with behavioral disturbance 01/29/2013  . Unspecified essential hypertension 01/29/2013  . Other and unspecified hyperlipidemia 01/29/2013  . Hearing loss 01/29/2013  . Coronary artery disease 01/29/2013  . Osteopenia 01/29/2013    Xenia Nile,CHRIS, PTA 02/11/2015, 5:25 PM  [facility] [address] [place], [place], [address] Phone: [phone]   Fax:  [phone]

## 2015-02-13 ENCOUNTER — Encounter: Payer: Self-pay | Admitting: Physical Therapy

## 2015-02-13 ENCOUNTER — Ambulatory Visit: Payer: Medicare Other | Admitting: Physical Therapy

## 2015-02-13 DIAGNOSIS — R2681 Unsteadiness on feet: Secondary | ICD-10-CM | POA: Diagnosis not present

## 2015-02-13 NOTE — Therapy (Signed)
[facility] [place], [place], [address] Phone: [phone]   Fax:  [phone]  Physical Therapy Treatment  Patient Details  Name: Sue Schaefer MRN: [id number] Date of Birth: November 17, 1919 Referring Provider:  Chipper Herb, MD  Encounter Date: 02/13/2015      PT End of Session - 02/13/15 1058    Visit Number 5   Number of Visits 18   Date for PT Re-Evaluation 03/13/15   PT Start Time 1057   PT Stop Time 1137   PT Time Calculation (min) 40 min   Activity Tolerance Patient tolerated treatment well   Behavior During Therapy [facility] for tasks assessed/performed      Past Medical History  Diagnosis Date  . Hypertension   . URI (upper respiratory infection)   . Anemia   . Memory loss   . Osteoporosis   . Vitamin D deficiency     No past surgical history on file.  There were no vitals filed for this visit.  Visit Diagnosis:  Gait instability      Subjective Assessment - 02/13/15 1057    Subjective Reports that she fell Tuesday night and is sore today on R side.    Patient Stated Goals Walk better. strengthen core   Currently in Pain? No/denies            [facility] PT Assessment - 02/13/15 0001    Assessment   Medical Diagnosis Gait Instability.                   [facility] Adult PT Treatment/Exercise - 02/13/15 0001    Transfers   Transfers Sit to Stand  30 reps; cueing for slow descent and LE use   Dynamic Standing Balance   Dynamic Standing - Balance Activities Reaching across midline  Color touching x 48min with feet together   Knee/Hip Exercises: Aerobic   Stationary Bike L6, seat 7 x 15 mins   Knee/Hip Exercises: Standing   Rocker Board 4 minutes   Other Standing Knee Exercises Bilateral seated dynadisc 4 way ankle exercise 30 reps each, Bilateral standing hip abduction x 20 reps each   Other Standing Knee Exercises Bilateral toe taps on 6" step x 30 reps                  PT Short  Term Goals - 01/16/15 1527    PT SHORT TERM GOAL #1   Title STG's=LTG's.   Time 4           PT Long Term Goals - 01/16/15 1528    PT LONG TERM GOAL #1   Title Independent with an advanced    Time 9   Period Weeks   Status New   PT LONG TERM GOAL #2   Title Improve Berg score by 4-5 points.   Time 9   Period Weeks   Status New               Plan - 02/13/15 1203    Clinical Impression Statement Patient did well with treatment today and did not require a rest break. Goals are on-going at this time. Had no complaints of pain at this time.    Pt will benefit from skilled therapeutic intervention in order to improve on the following deficits Abnormal gait;Decreased activity tolerance   Rehab Potential Good   PT Next Visit Plan Continue with core and balance exercises per PT POC.        Problem List Patient  Active Problem List   Diagnosis Date Noted  . Encounter for long-term (current) use of other medications 04/03/2013  . Essential hypertension, benign 04/03/2013  . CAD (coronary artery disease) of artery bypass graft 04/03/2013  . Frequency 04/03/2013  . Other malaise and fatigue 04/03/2013  . Contusion, wrist 04/03/2013  . Fracture of phalanx of right little finger 04/03/2013  . Memory impairment 01/29/2013  . Dementia with behavioral disturbance 01/29/2013  . Unspecified essential hypertension 01/29/2013  . Other and unspecified hyperlipidemia 01/29/2013  . Hearing loss 01/29/2013  . Coronary artery disease 01/29/2013  . Osteopenia 01/29/2013    Wynelle Fanny, PTA 02/13/2015, 12:08 PM  [facility] [address] [place], [place], [address] Phone: [phone]   Fax:  [phone]

## 2015-02-18 ENCOUNTER — Encounter: Payer: Medicare Other | Admitting: *Deleted

## 2015-02-20 ENCOUNTER — Encounter: Payer: Self-pay | Admitting: *Deleted

## 2015-02-20 ENCOUNTER — Ambulatory Visit: Payer: Medicare Other | Admitting: *Deleted

## 2015-02-20 DIAGNOSIS — R2681 Unsteadiness on feet: Secondary | ICD-10-CM

## 2015-02-20 NOTE — Therapy (Signed)
[facility] [place], [place], [address] Phone: [phone]   Fax:  [phone]  Physical Therapy Treatment  Patient Details  Name: Sue Schaefer MRN: [id number] Date of Birth: 27-Mar-1920 Referring Provider:  Chipper Herb, MD  Encounter Date: 02/20/2015      PT End of Session - 02/20/15 1524    Visit Number 6   Number of Visits 18   Date for PT Re-Evaluation 03/13/15   PT Start Time 1324   PT Stop Time 4010   PT Time Calculation (min) 49 min      Past Medical History  Diagnosis Date  . Hypertension   . URI (upper respiratory infection)   . Anemia   . Memory loss   . Osteoporosis   . Vitamin D deficiency     History reviewed. No pertinent past surgical history.  There were no vitals filed for this visit.  Visit Diagnosis:  Gait instability      Subjective Assessment - 02/20/15 1526    Subjective Doing good today                       [facility] Adult PT Treatment/Exercise - 02/20/15 0001    Dynamic Standing Balance   Dynamic Standing - Balance Activities Reaching across midline  standing on airex pad 3x30 with CGA   Exercises   Exercises Knee/Hip;Lumbar;Ankle   Knee/Hip Exercises: Aerobic   Stationary Bike L6, seat 7 x 15 mins   Knee/Hip Exercises: Standing   Rocker Board 4 minutes   Other Standing Knee Exercises Bilateral seated dynadisc 4 way ankle exercise 30 reps each, Bilateral standing hip abduction x 20 reps each   Other Standing Knee Exercises Bilateral toe taps on 6" step x 30 reps   Ankle Exercises: Seated   Heel Slides Limitations Dyna disc all motions both ankles                  PT Short Term Goals - 01/16/15 1527    PT SHORT TERM GOAL #1   Title STG's=LTG's.   Time 4           PT Long Term Goals - 02/20/15 1620    PT LONG TERM GOAL #1   Title Independent with an advanced    Time 9   Period Weeks   Status On-going   PT LONG TERM GOAL #2   Title  Improve Berg score by 4-5 points.   Time 9   Period Weeks   Status On-going               Plan - 02/20/15 1617    Clinical Impression Statement Pt did better today with Rx and was able to perform standing exs a little easier. No new goals met today and are ongoing   Pt will benefit from skilled therapeutic intervention in order to improve on the following deficits Abnormal gait;Decreased activity tolerance   Consulted and Agree with Plan of Care Patient        Problem List Patient Active Problem List   Diagnosis Date Noted  . Encounter for long-term (current) use of other medications 04/03/2013  . Essential hypertension, benign 04/03/2013  . CAD (coronary artery disease) of artery bypass graft 04/03/2013  . Frequency 04/03/2013  . Other malaise and fatigue 04/03/2013  . Contusion, wrist 04/03/2013  . Fracture of phalanx of right little finger 04/03/2013  . Memory impairment 01/29/2013  . Dementia with behavioral disturbance 01/29/2013  .  Unspecified essential hypertension 01/29/2013  . Other and unspecified hyperlipidemia 01/29/2013  . Hearing loss 01/29/2013  . Coronary artery disease 01/29/2013  . Osteopenia 01/29/2013    Michelle Vanhise,CHRIS,PTA 02/20/2015, 5:14 PM  [facility] [address] [place], [place], [address] Phone: [phone]   Fax:  [phone]

## 2015-02-25 ENCOUNTER — Encounter: Payer: Medicare Other | Admitting: *Deleted

## 2015-03-04 ENCOUNTER — Encounter: Payer: Self-pay | Admitting: *Deleted

## 2015-03-04 ENCOUNTER — Ambulatory Visit: Payer: Medicare Other | Admitting: *Deleted

## 2015-03-04 DIAGNOSIS — R2681 Unsteadiness on feet: Secondary | ICD-10-CM

## 2015-03-04 NOTE — Therapy (Signed)
[facility] [place], [place], [address] Phone: [phone]   Fax:  [phone]  Physical Therapy Treatment  Patient Details  Name: Sue Schaefer MRN: [id number] Date of Birth: [birth year] Referring Provider:  Chipper Herb, MD  Encounter Date: 03/04/2015      PT End of Session - 03/04/15 1623    Visit Number 7   Number of Visits 18   Date for PT Re-Evaluation 03/13/15   PT Start Time 1522   PT Stop Time 1608   PT Time Calculation (min) 46 min      Past Medical History  Diagnosis Date  . Hypertension   . URI (upper respiratory infection)   . Anemia   . Memory loss   . Osteoporosis   . Vitamin D deficiency     History reviewed. No pertinent past surgical history.  There were no vitals filed for this visit.  Visit Diagnosis:  Gait instability      Subjective Assessment - 03/04/15 1621    Subjective Feeling a little tired today. Balance feels a little better   Patient Stated Goals Walk better. strengthen core   Currently in Pain? No/denies                         [facility] Adult PT Treatment/Exercise - 03/04/15 0001    Dynamic Standing Balance   Dynamic Standing - Balance Activities Reaching across midline  3x30 with SBA/CGA reachin at dif. levels   Knee/Hip Exercises: Aerobic   Stationary Bike L1, seat 7 x 15 mins   Knee/Hip Exercises: Standing   Rocker Board 4 minutes  calf stretching and balance with SBA/CGA   Ankle Exercises: Seated   Other Seated Ankle Exercises Dyna disc x4 motions 3x10 each with  bil. ankle motions   Ankle Exercises: Standing   Other Standing Ankle Exercises Standing on inverted BOSU ball with CGA                  PT Short Term Goals - 01/16/15 1527    PT SHORT TERM GOAL #1   Title STG's=LTG's.   Time 4           PT Long Term Goals - 02/20/15 1620    PT LONG TERM GOAL #1   Title Independent with an advanced    Time 9   Period Weeks   Status On-going   PT LONG TERM GOAL #2   Title Improve Berg score by 4-5 points.   Time 9   Period Weeks   Status On-going               Plan - 03/04/15 1624    Clinical Impression Statement Pt was still able to complete exs and balance acts today, even though she was tired. She feels she is getting aroun better in her home with less LOB episodes   Pt will benefit from skilled therapeutic intervention in order to improve on the following deficits Abnormal gait;Decreased activity tolerance   Rehab Potential Good   PT Treatment/Interventions Gait training;Balance training;Therapeutic exercise   PT Next Visit Plan Continue with core and balance exercises per PT POC. Berg test next Rx.   Consulted and Agree with Plan of Care Patient        Problem List Patient Active Problem List   Diagnosis Date Noted  . Encounter for long-term (current) use of other medications 04/03/2013  . Essential hypertension, benign 04/03/2013  . CAD (coronary  artery disease) of artery bypass graft 04/03/2013  . Frequency 04/03/2013  . Other malaise and fatigue 04/03/2013  . Contusion, wrist 04/03/2013  . Fracture of phalanx of right little finger 04/03/2013  . Memory impairment 01/29/2013  . Dementia with behavioral disturbance 01/29/2013  . Unspecified essential hypertension 01/29/2013  . Other and unspecified hyperlipidemia 01/29/2013  . Hearing loss 01/29/2013  . Coronary artery disease 01/29/2013  . Osteopenia 01/29/2013    Devron Cohick,CHRIS, PTA 03/04/2015, 6:08 PM  [facility] [address] [place], [place], [address] Phone: [phone]   Fax:  [phone]

## 2015-03-06 ENCOUNTER — Encounter: Payer: Self-pay | Admitting: Physical Therapy

## 2015-03-06 ENCOUNTER — Ambulatory Visit: Payer: Medicare Other | Admitting: Physical Therapy

## 2015-03-06 DIAGNOSIS — R2681 Unsteadiness on feet: Secondary | ICD-10-CM

## 2015-03-06 NOTE — Therapy (Signed)
[facility] [place], [place], [address] Phone: [phone]   Fax:  [phone]  Physical Therapy Treatment  Patient Details  Name: Sue Schaefer MRN: [id number] Date of Birth: 07/20/1920 Referring Provider:  Chipper Herb, MD  Encounter Date: 03/06/2015      PT End of Session - 03/06/15 1537    Visit Number 8   Number of Visits 18   Date for PT Re-Evaluation 03/13/15   PT Start Time 6301   PT Stop Time 1604   PT Time Calculation (min) 30 min   Activity Tolerance Patient tolerated treatment well   Behavior During Therapy [facility] for tasks assessed/performed      Past Medical History  Diagnosis Date  . Hypertension   . URI (upper respiratory infection)   . Anemia   . Memory loss   . Osteoporosis   . Vitamin D deficiency     No past surgical history on file.  There were no vitals filed for this visit.  Visit Diagnosis:  Gait instability      Subjective Assessment - 03/06/15 1536    Subjective States that she cannot tell a difference in her walking and balance.   Patient Stated Goals Walk better. strengthen core   Currently in Pain? No/denies            [facility] PT Assessment - 03/06/15 0001    Assessment   Medical Diagnosis Gait Instability.                     [facility] Adult PT Treatment/Exercise - 03/06/15 0001    Standardized Balance Assessment   Standardized Balance Assessment Berg Balance Test   Berg Balance Test   Sit to Stand Able to stand without using hands and stabilize independently   Standing Unsupported Able to stand safely 2 minutes   Sitting with Back Unsupported but Feet Supported on Floor or Stool Able to sit safely and securely 2 minutes   Stand to Sit Sits safely with minimal use of hands   Transfers Able to transfer safely, minor use of hands   Standing Unsupported with Eyes Closed Able to stand 10 seconds with supervision   Standing Ubsupported with Feet Together Able  to place feet together independently but unable to hold for 30 seconds   From Standing, Reach Forward with Outstretched Arm Can reach forward >12 cm safely (5")   From Standing Position, Pick up Object from Floor Able to pick up shoe, needs supervision   From Standing Position, Turn to Look Behind Over each Shoulder Looks behind one side only/other side shows less weight shift   Turn 360 Degrees Able to turn 360 degrees safely but slowly   Standing Unsupported, Alternately Place Feet on Step/Stool Able to stand independently and complete 8 steps >20 seconds   Standing Unsupported, One Foot in Front Able to plae foot ahead of the other independently and hold 30 seconds   Standing on One Leg Able to lift leg independently and hold equal to or more than 3 seconds   Total Score 44   Knee/Hip Exercises: Aerobic   Stationary Bike L2 x9 min  Patient had to stop due to fatigue                  PT Short Term Goals - 03/06/15 1537    PT SHORT TERM GOAL #1   Title STG's=LTG's.   Time 4   Status On-going  PT Long Term Goals - 03/06/15 1538    PT LONG TERM GOAL #1   Title Independent with an advanced    Time 9   Period Weeks   Status On-going   PT LONG TERM GOAL #2   Title Improve Berg score by 4-5 points.   Time 9   Period Weeks   Status On-going               Plan - 03/06/15 1553    Clinical Impression Statement Patient was 15 minutes late for appointment. Expressed fatigue throughout treatment so exercise was limited. Berg test recorded as 44 points and considered at significant risk for falls.   Pt will benefit from skilled therapeutic intervention in order to improve on the following deficits Abnormal gait;Decreased activity tolerance   Rehab Potential Good   PT Treatment/Interventions Gait training;Balance training;Therapeutic exercise   PT Next Visit Plan Continue with core and balance exercises per PT POC.    Consulted and Agree with Plan of Care  Patient        Problem List Patient Active Problem List   Diagnosis Date Noted  . Encounter for long-term (current) use of other medications 04/03/2013  . Essential hypertension, benign 04/03/2013  . CAD (coronary artery disease) of artery bypass graft 04/03/2013  . Frequency 04/03/2013  . Other malaise and fatigue 04/03/2013  . Contusion, wrist 04/03/2013  . Fracture of phalanx of right little finger 04/03/2013  . Memory impairment 01/29/2013  . Dementia with behavioral disturbance 01/29/2013  . Unspecified essential hypertension 01/29/2013  . Other and unspecified hyperlipidemia 01/29/2013  . Hearing loss 01/29/2013  . Coronary artery disease 01/29/2013  . Osteopenia 01/29/2013    Wynelle Fanny, PTA 03/06/2015, 4:23 PM  [facility] [address] [place], [place], [address] Phone: [phone]   Fax:  [phone]

## 2015-03-11 ENCOUNTER — Encounter: Payer: Medicare Other | Admitting: Physical Therapy

## 2015-03-13 ENCOUNTER — Encounter: Payer: Medicare Other | Admitting: *Deleted

## 2015-03-18 ENCOUNTER — Encounter: Payer: Self-pay | Admitting: *Deleted

## 2015-03-18 ENCOUNTER — Ambulatory Visit: Payer: Medicare Other | Attending: Family Medicine | Admitting: *Deleted

## 2015-03-18 DIAGNOSIS — R2681 Unsteadiness on feet: Secondary | ICD-10-CM | POA: Insufficient documentation

## 2015-03-18 NOTE — Therapy (Addendum)
[facility] [place], [place], [address] Phone: [phone]   Fax:  [phone]  Physical Therapy Treatment  Patient Details  Name: Sue Schaefer MRN: [id number] Date of Birth: 08-28-1920 Referring Provider:  Chipper Herb, MD  Encounter Date: 2015-03-30      PT End of Session - 30-Mar-2015 1434    Visit Number 9   Number of Visits 18   Date for PT Re-Evaluation 03/13/15   PT Start Time 15      Past Medical History  Diagnosis Date  . Hypertension   . URI (upper respiratory infection)   . Anemia   . Memory loss   . Osteoporosis   . Vitamin D deficiency     History reviewed. No pertinent past surgical history.  There were no vitals filed for this visit.  Visit Diagnosis:  Gait instability                       [facility] Adult PT Treatment/Exercise - 2015-03-30 0001    Exercises   Exercises Knee/Hip;Lumbar;Ankle   Knee/Hip Exercises: Standing   Knee Flexion AROM;3 sets;10 reps  marching   Hip ADduction AROM;Both;3 sets;10 reps   Rocker Board 5 minutes   Knee/Hip Exercises: Seated   Long Arc Quad Strengthening;2 sets;10 reps;Both   Other Seated Knee Exercises sit to stand  x10 with UE Assist   Ankle Exercises: Aerobic   Stationary Bike 15 min, L0-1   Ankle Exercises: Standing   Heel Raises 2 seconds  3x10   Other Standing Ankle Exercises 6 in box toe taps 3x20   Ankle Exercises: Seated   Other Seated Ankle Exercises Dyna disc x4 motions 3x10 each with  bil. ankle motions                  PT Short Term Goals - 03/06/15 1537    PT SHORT TERM GOAL #1   Title STG's=LTG's.   Time 4   Status On-going           PT Long Term Goals - 03-30-2015 1437    PT LONG TERM GOAL #1   Title Independent with an advanced    Period Weeks   Status Achieved   PT LONG TERM GOAL #2   Title Improve Berg score by 4-5 points.   Time 9   Period Weeks   Status Not Met                Plan - Mar 30, 2015 1459    Clinical Impression Statement Pt did good with Exs and acts in clinic today. She was able to meet LTG #1 today, but not #2 due to balance deficit. She is Independent in [employer] and acts.   Pt will benefit from skilled therapeutic intervention in order to improve on the following deficits Abnormal gait;Decreased activity tolerance   Rehab Potential Good   PT Treatment/Interventions Gait training;Balance training;Therapeutic exercise   PT Next Visit Plan D/C to Gym exs and Acts due to plateau with PT   Consulted and Agree with Plan of Care Patient          G-Codes - 03-30-2015 1719    Functional Assessment Tool Used Clinical Judement.   Functional Limitation Mobility: Walking and moving around   Mobility: Walking and Moving Around Current Status [phone]) At least 20 percent but less than 40 percent impaired, limited or restricted   Mobility: Walking and Moving Around Goal Status ([id number]) At least 20  percent but less than 40 percent impaired, limited or restricted   Mobility: Walking and Moving Around Discharge Status [id number]) At least 20 percent but less than 40 percent impaired, limited or restricted      Problem List Patient Active Problem List   Diagnosis Date Noted  . Encounter for long-term (current) use of other medications 04/03/2013  . Essential hypertension, benign 04/03/2013  . CAD (coronary artery disease) of artery bypass graft 04/03/2013  . Frequency 04/03/2013  . Other malaise and fatigue 04/03/2013  . Contusion, wrist 04/03/2013  . Fracture of phalanx of right little finger 04/03/2013  . Memory impairment 01/29/2013  . Dementia with behavioral disturbance 01/29/2013  . Unspecified essential hypertension 01/29/2013  . Other and unspecified hyperlipidemia 01/29/2013  . Hearing loss 01/29/2013  . Coronary artery disease 01/29/2013  . Osteopenia 01/29/2013   PHYSICAL THERAPY DISCHARGE SUMMARY  Visits from Start of Care: 9  Current functional  level related to goals / functional outcomes: Please see above.   Remaining deficits: Patient progressed nicely in PT but will continue to require a FWW for safe ambulation.   Education / Equipment: Patient is joining our self-directed gym program. Plan: Patient agrees to discharge.  Patient goals were partially met. Patient is being discharged due to being pleased with the current functional level.  ?????     Kalisi Bevill, [place] MPT 03/18/2015, 5:20 PM  [facility] [address] [place], [place], [address] Phone: [phone]   Fax:  [phone]

## 2015-03-18 NOTE — Therapy (Signed)
[facility] [place], [place], [address] Phone: [phone]   Fax:  [phone]  Physical Therapy Treatment  Patient Details  Name: Sue Schaefer MRN: [id number] Date of Birth: 1920/08/28 Referring Provider:  Chipper Herb, MD  Encounter Date: 03/18/2015      PT End of Session - 03/18/15 1434    Visit Number 9   Number of Visits 18   Date for PT Re-Evaluation 03/13/15   PT Start Time 45      Past Medical History  Diagnosis Date  . Hypertension   . URI (upper respiratory infection)   . Anemia   . Memory loss   . Osteoporosis   . Vitamin D deficiency     History reviewed. No pertinent past surgical history.  There were no vitals filed for this visit.  Visit Diagnosis:  Gait instability                       [facility] Adult PT Treatment/Exercise - 03/18/15 0001    Exercises   Exercises Knee/Hip;Lumbar;Ankle   Knee/Hip Exercises: Standing   Knee Flexion AROM;3 sets;10 reps  marching   Hip ADduction AROM;Both;3 sets;10 reps   Rocker Board 5 minutes   Knee/Hip Exercises: Seated   Long Arc Quad Strengthening;2 sets;10 reps;Both   Other Seated Knee Exercises sit to stand  x10 with UE Assist   Ankle Exercises: Aerobic   Stationary Bike 15 min, L0-1   Ankle Exercises: Standing   Heel Raises 2 seconds  3x10   Other Standing Ankle Exercises 6 in box toe taps 3x20   Ankle Exercises: Seated   Other Seated Ankle Exercises Dyna disc x4 motions 3x10 each with  bil. ankle motions                  PT Short Term Goals - 03/06/15 1537    PT SHORT TERM GOAL #1   Title STG's=LTG's.   Time 4   Status On-going           PT Long Term Goals - 03/18/15 1437    PT LONG TERM GOAL #1   Title Independent with an advanced    Period Weeks   Status Achieved   PT LONG TERM GOAL #2   Title Improve Berg score by 4-5 points.   Time 9   Period Weeks   Status Not Met                Plan - 03/18/15 1459    Clinical Impression Statement Pt did good with Exs and acts in clinic today. She was able to meet LTG #1 today, but not #2 due to balance deficit. She is Independent in [employer] and acts.   Pt will benefit from skilled therapeutic intervention in order to improve on the following deficits Abnormal gait;Decreased activity tolerance   Rehab Potential Good   PT Treatment/Interventions Gait training;Balance training;Therapeutic exercise   PT Next Visit Plan D/C to Gym exs and Acts due to plateau with PT   Consulted and Agree with Plan of Care Patient        Problem List Patient Active Problem List   Diagnosis Date Noted  . Encounter for long-term (current) use of other medications 04/03/2013  . Essential hypertension, benign 04/03/2013  . CAD (coronary artery disease) of artery bypass graft 04/03/2013  . Frequency 04/03/2013  . Other malaise and fatigue 04/03/2013  . Contusion, wrist 04/03/2013  . Fracture of phalanx  of right little finger 04/03/2013  . Memory impairment 01/29/2013  . Dementia with behavioral disturbance 01/29/2013  . Unspecified essential hypertension 01/29/2013  . Other and unspecified hyperlipidemia 01/29/2013  . Hearing loss 01/29/2013  . Coronary artery disease 01/29/2013  . Osteopenia 01/29/2013    Madyx Delfin,CHRIS, PTA 03/18/2015, 3:16 PM  [facility] [address] [place], [place], [address] Phone: [phone]   Fax:  [phone]

## 2015-03-25 ENCOUNTER — Other Ambulatory Visit: Payer: Self-pay

## 2015-03-25 DIAGNOSIS — F0391 Unspecified dementia with behavioral disturbance: Secondary | ICD-10-CM

## 2015-03-25 DIAGNOSIS — F03918 Unspecified dementia, unspecified severity, with other behavioral disturbance: Secondary | ICD-10-CM

## 2015-03-25 MED ORDER — MEMANTINE HCL ER 28 MG PO CP24: 28.0000 mg | ORAL_CAPSULE | Freq: Every day | ORAL | Status: DC

## 2015-03-25 NOTE — Telephone Encounter (Signed)
Last seen 10/25/14  B [place]

## 2015-04-14 ENCOUNTER — Ambulatory Visit (INDEPENDENT_AMBULATORY_CARE_PROVIDER_SITE_OTHER): Payer: Medicare Other | Admitting: Family Medicine

## 2015-04-14 ENCOUNTER — Ambulatory Visit (INDEPENDENT_AMBULATORY_CARE_PROVIDER_SITE_OTHER): Payer: Medicare Other

## 2015-04-14 ENCOUNTER — Encounter: Payer: Self-pay | Admitting: Family Medicine

## 2015-04-14 ENCOUNTER — Other Ambulatory Visit: Payer: Self-pay | Admitting: Family Medicine

## 2015-04-14 VITALS — BP 136/74 | HR 93 | Temp 96.6°F | Ht 60.0 in | Wt 132.8 lb

## 2015-04-14 DIAGNOSIS — I10 Essential (primary) hypertension: Secondary | ICD-10-CM | POA: Diagnosis not present

## 2015-04-14 DIAGNOSIS — R0600 Dyspnea, unspecified: Secondary | ICD-10-CM | POA: Diagnosis not present

## 2015-04-14 DIAGNOSIS — M1990 Unspecified osteoarthritis, unspecified site: Secondary | ICD-10-CM

## 2015-04-14 DIAGNOSIS — J441 Chronic obstructive pulmonary disease with (acute) exacerbation: Secondary | ICD-10-CM | POA: Diagnosis not present

## 2015-04-14 DIAGNOSIS — R2689 Other abnormalities of gait and mobility: Secondary | ICD-10-CM | POA: Diagnosis not present

## 2015-04-14 DIAGNOSIS — R350 Frequency of micturition: Secondary | ICD-10-CM | POA: Diagnosis not present

## 2015-04-14 LAB — POCT URINALYSIS DIPSTICK
Bilirubin, UA: NEGATIVE
Glucose, UA: NEGATIVE
Ketones, UA: NEGATIVE
Leukocytes, UA: NEGATIVE
NITRITE UA: NEGATIVE
PH UA: 5
Protein, UA: NEGATIVE
RBC UA: NEGATIVE
SPEC GRAV UA: 1.01
Urobilinogen, UA: NEGATIVE

## 2015-04-14 LAB — POCT UA - MICROSCOPIC ONLY
Bacteria, U Microscopic: NEGATIVE
CRYSTALS, UR, HPF, POC: NEGATIVE
Casts, Ur, LPF, POC: NEGATIVE
Mucus, UA: NEGATIVE
RBC, URINE, MICROSCOPIC: NEGATIVE
WBC, Ur, HPF, POC: NEGATIVE
Yeast, UA: NEGATIVE

## 2015-04-14 LAB — POCT CBC
GRANULOCYTE PERCENT: 64.3 % (ref 37–80)
HEMATOCRIT: 33.9 % — AB (ref 37.7–47.9)
Hemoglobin: 11.3 g/dL — AB (ref 12.2–16.2)
Lymph, poc: 1.7 (ref 0.6–3.4)
MCH, POC: 28.7 pg (ref 27–31.2)
MCHC: 33.5 g/dL (ref 31.8–35.4)
MCV: 85.7 fL (ref 80–97)
MPV: 7.5 fL (ref 0–99.8)
POC Granulocyte: 4.6 (ref 2–6.9)
POC LYMPH PERCENT: 24.3 %L (ref 10–50)
Platelet Count, POC: 199 10*3/uL (ref 142–424)
RBC: 3.95 M/uL — AB (ref 4.04–5.48)
RDW, POC: 14.6 %
WBC: 7.2 10*3/uL (ref 4.6–10.2)

## 2015-04-14 MED ORDER — BECLOMETHASONE DIPROPIONATE 80 MCG/ACT IN AERS: 2.0000 | INHALATION_SPRAY | Freq: Every day | RESPIRATORY_TRACT | Status: DC

## 2015-04-14 NOTE — Progress Notes (Signed)
Subjective:  Patient ID: Sue Schaefer, female    DOB: Oct 07, 1920  Age: 81 y.o. MRN: [id number]  CC: Shortness of Breath; Hypertension; Hyperlipidemia; and Urinary Frequency   HPI Sue Schaefer presents for  follow-up of hypertension. Patient has no history of headache chest pain or recent cough. Patient also denies symptoms of TIA such as numbness weakness lateralizing. Patient checks  blood pressure at home and has not had any elevated readings recently. Patient denies side effects from his medication. States taking it regularly.  The urinary symptoms are negative for dysuria and urgency. There is some right lower quadrant discomfort that is mild. Onset a few days ago.  The shortness of breath is not accompanied by excessive fatigue. It is not accompanied by edema. Patient was exposed to secondhand smoke throughout most of her adult life in the home.  She is having some difficulty with steadiness on her feet. She also is having some joint pains intermittently.   History Sue Schaefer has a past medical history of Hypertension; URI (upper respiratory infection); Anemia; Memory loss; Osteoporosis; and Vitamin D deficiency.   She has no past surgical history on file.   Her family history is not on file.She reports that she has never smoked. She has never used smokeless tobacco. She reports that she does not drink alcohol or use illicit drugs.  Current Outpatient Prescriptions on File Prior to Visit  Medication Sig Dispense Refill  . Acai Berry 500 MG CAPS Take 1 capsule by mouth.    Marland Kitchen alendronate (FOSAMAX) 70 MG tablet TAKE 1 TABLET ONCE A WEEK 4 tablet 2  . amLODipine (NORVASC) 5 MG tablet Take 1 tablet (5 mg total) by mouth daily. 30 tablet 3  . Ascorbic Acid (VITAMIN C) 1000 MG tablet Take 1,000 mg by mouth daily.      Marland Kitchen aspirin 81 MG chewable tablet Chew 81 mg by mouth daily.      Marland Kitchen atenolol (TENORMIN) 25 MG tablet TAKE 1 TABLET ONCE A DAY 30 tablet 5  . B Complex-C-Min-Fe-FA (HEMATINIC  PLUS COMPLEX PO) Take by mouth 2 (two) times daily.      . Calcium-Magnesium-Zinc 333-133-5 MG TABS Take 1 tablet by mouth.    . Calcium-Vitamin D 600-200 MG-UNIT per tablet Take 1 tablet by mouth.    . Cholecalciferol (VITAMIN D3) 10000 UNITS capsule Take 10,000 Units by mouth daily.      . citalopram (CELEXA) 10 MG tablet Take 1 tablet (10 mg total) by mouth daily. 30 tablet 11  . co-enzyme Q-10 30 MG capsule Take 30 mg by mouth daily.    Marland Kitchen docusate sodium (COLACE) 100 MG capsule Take 2 capsules (200 mg total) by mouth daily. 60 capsule 6  . donepezil (ARICEPT) 10 MG tablet Take 1 tablet (10 mg total) by mouth at bedtime as needed. 30 tablet 5  . ferrous sulfate 325 (65 FE) MG tablet Take 325 mg by mouth daily with breakfast.    . folic acid (FOLVITE) 734 MCG tablet Take 400 mcg by mouth daily.    . furosemide (LASIX) 40 MG tablet TAKE 1 TABLET DAILY 30 tablet 5  . Garlic 1937 MG CAPS Take 1 capsule by mouth daily.    Marland Kitchen KRILL OIL PO Take 1 capsule by mouth daily.    . Lutein 6 MG CAPS Take by mouth daily.      . memantine (NAMENDA XR) 28 MG CP24 24 hr capsule Take 1 capsule (28 mg total) by mouth daily. 30 capsule 0  .  mirabegron ER (MYRBETRIQ) 50 MG TB24 tablet Take 1 tablet (50 mg total) by mouth at bedtime. 30 tablet 11  . Multiple Vitamin (MULTIVITAMIN) capsule Take 4 capsules by mouth.    Marland Kitchen NIFEDICAL XL 30 MG 24 hr tablet     . nitroGLYCERIN (NITROSTAT) 0.4 MG SL tablet Place 0.4 mg under the tongue every 5 (five) minutes as needed.      . Omega-3 1000 MG CAPS Take 1 capsule by mouth.    . polycarbophil (FIBERCON) 625 MG tablet Take 625 mg by mouth 2 (two) times daily.      Vladimir Faster Glycol-Propyl Glycol (SYSTANE) 0.4-0.3 % SOLN Apply 1 drop to eye 4 (four) times daily.    . potassium chloride SA (K-DUR,KLOR-CON) 20 MEQ tablet TAKE 1 TABLET DAILY 30 tablet 4  . prednisoLONE acetate (PRED FORTE) 1 % ophthalmic suspension Place 1 drop into the right eye 2 (two) times daily.    .  Turmeric (RA TURMERIC) 500 MG CAPS Take 1 capsule by mouth.    . vitamin E 400 UNIT capsule Take 400 Units by mouth daily.       No current facility-administered medications on file prior to visit.    ROS Review of Systems  Constitutional: Negative for fever, chills, diaphoresis, appetite change, fatigue and unexpected weight change.  HENT: Negative for congestion, ear pain, hearing loss, postnasal drip, rhinorrhea, sneezing, sore throat and trouble swallowing.   Eyes: Negative for pain.  Respiratory: Negative for cough, chest tightness and shortness of breath.   Cardiovascular: Negative for chest pain and palpitations.  Gastrointestinal: Negative for nausea, vomiting, abdominal pain, diarrhea and constipation.  Genitourinary: Negative for dysuria, frequency and menstrual problem.  Musculoskeletal: Negative for joint swelling and arthralgias.  Skin: Negative for rash.  Neurological: Negative for dizziness, weakness, numbness and headaches.  Psychiatric/Behavioral: Negative for dysphoric mood and agitation.    Objective:  BP 136/74 mmHg  Pulse 93  Temp(Src) 96.6 F (35.9 C) (Oral)  Ht 5' (1.524 m)  Wt 132 lb 12.8 oz (60.238 kg)  BMI 25.94 kg/m2  SpO2 98%  BP Readings from Last 3 Encounters:  04/14/15 136/74  10/25/14 146/67  06/14/14 116/58    Wt Readings from Last 3 Encounters:  04/14/15 132 lb 12.8 oz (60.238 kg)  10/25/14 131 lb 3.2 oz (59.512 kg)  06/14/14 131 lb (59.421 kg)     Physical Exam  Constitutional: She is oriented to person, place, and time. She appears well-developed and well-nourished. No distress.  HENT:  Head: Normocephalic and atraumatic.  Right Ear: External ear normal.  Left Ear: External ear normal.  Nose: Nose normal.  Mouth/Throat: Oropharynx is clear and moist.  Eyes: Conjunctivae and EOM are normal. Pupils are equal, round, and reactive to light.  Neck: Normal range of motion. Neck supple. No thyromegaly present.  Cardiovascular: Normal  rate, regular rhythm and normal heart sounds.   No murmur heard. Pulmonary/Chest: Effort normal and breath sounds normal. No respiratory distress. She has no wheezes. She has no rales.  Abdominal: Soft. Bowel sounds are normal. She exhibits no distension. There is no tenderness.  Lymphadenopathy:    She has no cervical adenopathy.  Neurological: She is alert and oriented to person, place, and time. She has normal reflexes.  Skin: Skin is warm and dry.  Psychiatric: She has a normal mood and affect. Her behavior is normal. Judgment and thought content normal.    No results found for: HGBA1C  Lab Results  Component Value Date  WBC 7.2 04/14/2015   HGB 11.3* 04/14/2015   HCT 33.9* 04/14/2015   GLUCOSE 93 01/21/2014   CHOL 202* 04/03/2013   TRIG 85 04/03/2013   HDL 67 04/03/2013   LDLCALC 118* 04/03/2013   ALT 21 01/21/2014   AST 33 01/21/2014   NA 139 01/21/2014   K 4.2 01/21/2014   CL 100 01/21/2014   CREATININE 1.16* 01/21/2014   BUN 25 01/21/2014   CO2 20 01/21/2014   TSH 1.020 01/21/2014    No results found.  Assessment & Plan:   Ameisha was seen today for shortness of breath, hypertension, hyperlipidemia and urinary frequency.  Diagnoses and all orders for this visit:  Dyspnea Orders: -     DG Chest 2 View; Future -     PR BREATHING CAPACITY TEST  Urinary frequency Orders: -     POCT CBC -     CMP14+EGFR -     POCT urinalysis dipstick -     POCT UA - Microscopic Only  Essential hypertension Orders: -     POCT CBC -     CMP14+EGFR  Arthritis Orders: -     DME Wheelchair manual  Impaired gait and mobility Orders: -     DME Other see comment  COPD exacerbation  Other orders -     beclomethasone (QVAR) 80 MCG/ACT inhaler; Inhale 2 puffs into the lungs daily.   I am having Ms. Dobransky start on beclomethasone. I am also having her maintain her nitroGLYCERIN, aspirin, B Complex-C-Min-Fe-FA (HEMATINIC PLUS COMPLEX PO), vitamin C, vitamin E,  polycarbophil, Vitamin D3, Lutein, KRILL OIL PO, co-enzyme Q-10, ferrous sulfate, folic acid, Garlic, Polyethyl Glycol-Propyl Glycol, prednisoLONE acetate, amLODipine, NIFEDICAL XL, Acai Berry, Calcium-Vitamin D, Calcium-Magnesium-Zinc, multivitamin, Omega-3, Turmeric, citalopram, furosemide, docusate sodium, donepezil, mirabegron ER, alendronate, atenolol, potassium chloride SA, memantine, and isosorbide mononitrate.  Meds ordered this encounter  Medications  . beclomethasone (QVAR) 80 MCG/ACT inhaler    Sig: Inhale 2 puffs into the lungs daily.    Dispense:  1 Inhaler    Refill:  12     Follow-up: Return in about 3 months (around 07/15/2015).  Claretta Fraise, M.D.

## 2015-04-15 LAB — CMP14+EGFR
ALK PHOS: 67 IU/L (ref 39–117)
ALT: 19 IU/L (ref 0–32)
AST: 31 IU/L (ref 0–40)
Albumin/Globulin Ratio: 1.4 (ref 1.1–2.5)
Albumin: 3.9 g/dL (ref 3.2–4.6)
BUN/Creatinine Ratio: 24 (ref 11–26)
BUN: 29 mg/dL (ref 10–36)
Bilirubin Total: 0.3 mg/dL (ref 0.0–1.2)
CO2: 24 mmol/L (ref 18–29)
CREATININE: 1.21 mg/dL — AB (ref 0.57–1.00)
Calcium: 9.6 mg/dL (ref 8.7–10.3)
Chloride: 99 mmol/L (ref 97–108)
GFR calc Af Amer: 45 mL/min/{1.73_m2} — ABNORMAL LOW (ref >59)
GFR calc non Af Amer: 39 mL/min/{1.73_m2} — ABNORMAL LOW (ref >59)
Globulin, Total: 2.7 g/dL (ref 1.5–4.5)
Glucose: 95 mg/dL (ref 65–99)
POTASSIUM: 4.5 mmol/L (ref 3.5–5.2)
Sodium: 139 mmol/L (ref 134–144)
TOTAL PROTEIN: 6.6 g/dL (ref 6.0–8.5)

## 2015-04-16 ENCOUNTER — Telehealth: Payer: Self-pay | Admitting: Family Medicine

## 2015-04-16 NOTE — Progress Notes (Signed)
lmtcb

## 2015-04-28 ENCOUNTER — Other Ambulatory Visit: Payer: Self-pay | Admitting: Family Medicine

## 2015-04-29 ENCOUNTER — Other Ambulatory Visit: Payer: Self-pay

## 2015-04-29 MED ORDER — ALENDRONATE SODIUM 70 MG PO TABS: ORAL_TABLET | ORAL | Status: DC

## 2015-05-05 ENCOUNTER — Ambulatory Visit: Payer: Medicare Other | Admitting: Physician Assistant

## 2015-05-14 ENCOUNTER — Ambulatory Visit (INDEPENDENT_AMBULATORY_CARE_PROVIDER_SITE_OTHER): Payer: Medicare Other | Admitting: Physician Assistant

## 2015-05-14 ENCOUNTER — Encounter: Payer: Self-pay | Admitting: Physician Assistant

## 2015-05-14 VITALS — BP 123/54 | HR 72 | Temp 97.0°F | Ht 60.0 in

## 2015-05-14 DIAGNOSIS — D485 Neoplasm of uncertain behavior of skin: Secondary | ICD-10-CM | POA: Diagnosis not present

## 2015-05-14 DIAGNOSIS — R300 Dysuria: Secondary | ICD-10-CM

## 2015-05-14 LAB — POCT URINALYSIS DIPSTICK
Bilirubin, UA: NEGATIVE
Blood, UA: NEGATIVE
GLUCOSE UA: NEGATIVE
Ketones, UA: NEGATIVE
NITRITE UA: NEGATIVE
PH UA: 7
Spec Grav, UA: <=1.005
Urobilinogen, UA: NEGATIVE

## 2015-05-14 LAB — POCT UA - MICROSCOPIC ONLY
CRYSTALS, UR, HPF, POC: NEGATIVE
Casts, Ur, LPF, POC: NEGATIVE
Mucus, UA: NEGATIVE
RBC, urine, microscopic: NEGATIVE
YEAST UA: NEGATIVE

## 2015-05-14 NOTE — Progress Notes (Signed)
   Subjective:    Patient ID: Sue Schaefer, female    DOB: [birth year], 81 y.o.   MRN: [id number]  HPI 81 y/o female presents with c/o new occuring lesion on nose and left cheek.   She also has c/o orange urine with increased odor.     Review of Systems  Constitutional: Negative.   HENT: Negative.   Genitourinary:       Orange, foul smelling urine   Skin:       Scaling lesion on left cheek Bump on left side of nose and upper lip        Objective:   Physical Exam  Constitutional: She is oriented to person, place, and time. She appears well-developed and well-nourished.  Neurological: She is alert and oriented to person, place, and time.  Skin: She is not diaphoretic.  Hyperkeratotic, scaling lesion on left cheek with surrounding erythema, suspicious for BCC/SCC   Shiny, erythematous papule on left lateral nostril suspicious for BCC  Benign appearing milia on right nasolabial fold and superior to upper lip   Psychiatric: She has a normal mood and affect. Her behavior is normal.  Nursing note and vitals reviewed.         Assessment & Plan:  1. Neoplasm of uncertain behavior of skin - Approximately .23mm lesion on left nose biopsied via wider shave to r/o BCC/SCC  - Hyperkeratotic lesion on left cheek, <.5cm biopsied to r/o BCC/SCC via wider shave. Monsels applied to both.   Await pathology and will treat accordingly.   2. Dysuria - Will treat if needed once labs results are obtained.      Aubre Quincy A. Benjamin Stain PA-C

## 2015-05-15 DIAGNOSIS — L570 Actinic keratosis: Secondary | ICD-10-CM | POA: Diagnosis not present

## 2015-05-15 DIAGNOSIS — C44311 Basal cell carcinoma of skin of nose: Secondary | ICD-10-CM | POA: Diagnosis not present

## 2015-05-15 DIAGNOSIS — D485 Neoplasm of uncertain behavior of skin: Secondary | ICD-10-CM | POA: Diagnosis not present

## 2015-05-15 NOTE — Addendum Note (Signed)
Addended by: Marline Backbone A on: 05/15/2015 10:59 AM   Modules accepted: Orders

## 2015-05-19 LAB — PATHOLOGY

## 2015-05-20 ENCOUNTER — Other Ambulatory Visit: Payer: Self-pay | Admitting: Family Medicine

## 2015-05-22 ENCOUNTER — Other Ambulatory Visit: Payer: Self-pay | Admitting: Physician Assistant

## 2015-05-22 ENCOUNTER — Telehealth: Payer: Self-pay | Admitting: Physician Assistant

## 2015-05-22 ENCOUNTER — Telehealth: Payer: Self-pay | Admitting: *Deleted

## 2015-05-22 MED ORDER — CIPROFLOXACIN HCL 500 MG PO TABS: 500.0000 mg | ORAL_TABLET | Freq: Two times a day (BID) | ORAL | Status: DC

## 2015-05-22 NOTE — Telephone Encounter (Signed)
Must give results in person.  Too many details.

## 2015-05-22 NOTE — Telephone Encounter (Signed)
Result notes sent to pool A - please call   Shaune Malacara A. Benjamin Stain PA-C

## 2015-05-22 NOTE — Telephone Encounter (Signed)
Please call to discuss results

## 2015-05-22 NOTE — Telephone Encounter (Signed)
-----   Message from Adella Nissen, [place] sent at 05/22/2015 12:53 PM EDT ----- Biopsy of left cheek was actinic keratosis - no further treatment required, will continue to monitor Biopsy of lesion on nose was Basal cell Carcinoma. This is the most common type of skin cancer and we can treat in office - 30 minute hyfrecation and curette Urine was positive for infection - Prescription sent to pharmacy   Tiffany A. Benjamin Stain PA-C

## 2015-05-22 NOTE — Telephone Encounter (Signed)
Patient requesting bx and urine results.

## 2015-05-22 NOTE — Telephone Encounter (Signed)
Please call for results

## 2015-06-02 ENCOUNTER — Ambulatory Visit (INDEPENDENT_AMBULATORY_CARE_PROVIDER_SITE_OTHER): Payer: Medicare Other | Admitting: Physician Assistant

## 2015-06-02 DIAGNOSIS — C44311 Basal cell carcinoma of skin of nose: Secondary | ICD-10-CM | POA: Diagnosis not present

## 2015-06-02 MED ORDER — MUPIROCIN 2 % EX OINT: TOPICAL_OINTMENT | CUTANEOUS | Status: DC

## 2015-06-10 ENCOUNTER — Encounter: Payer: Self-pay | Admitting: Physician Assistant

## 2015-06-10 NOTE — Progress Notes (Signed)
Patient ID: Sue Schaefer, female   DOB: 09/15/1920, 81 y.o.   MRN: [id number]   81 y/o female presents for treatment of biopsied lesions on left cheek and left nose on  05/19/15. Lesion on cheek was precancerous actinic keratosis and lesion on left nose was Basal Cell Carcinoma.   .5cm Lesion on left cheek was treated with cryosurgery x 4. BCC  on left nose was treated via hyfrecation and curettage. Diameter of treated BCC was 1.3cm.   Mupirocin 2% ointment prescribed BID-TID x 14 days. F/U in 2 weeks for recheck.   Fynlee Rowlands A. Benjamin Stain PA-C

## 2015-06-12 ENCOUNTER — Other Ambulatory Visit: Payer: Self-pay | Admitting: Family Medicine

## 2015-07-13 IMAGING — CR DG CHEST 2V
2 series · 2 of 2 positions shown · non-contrast
Comparison: 09/07/2011

CLINICAL DATA: Dyspnea.  COPD.  Coronary artery disease.

EXAM:
CHEST  2 VIEW

[view not recorded (1 of 2)]
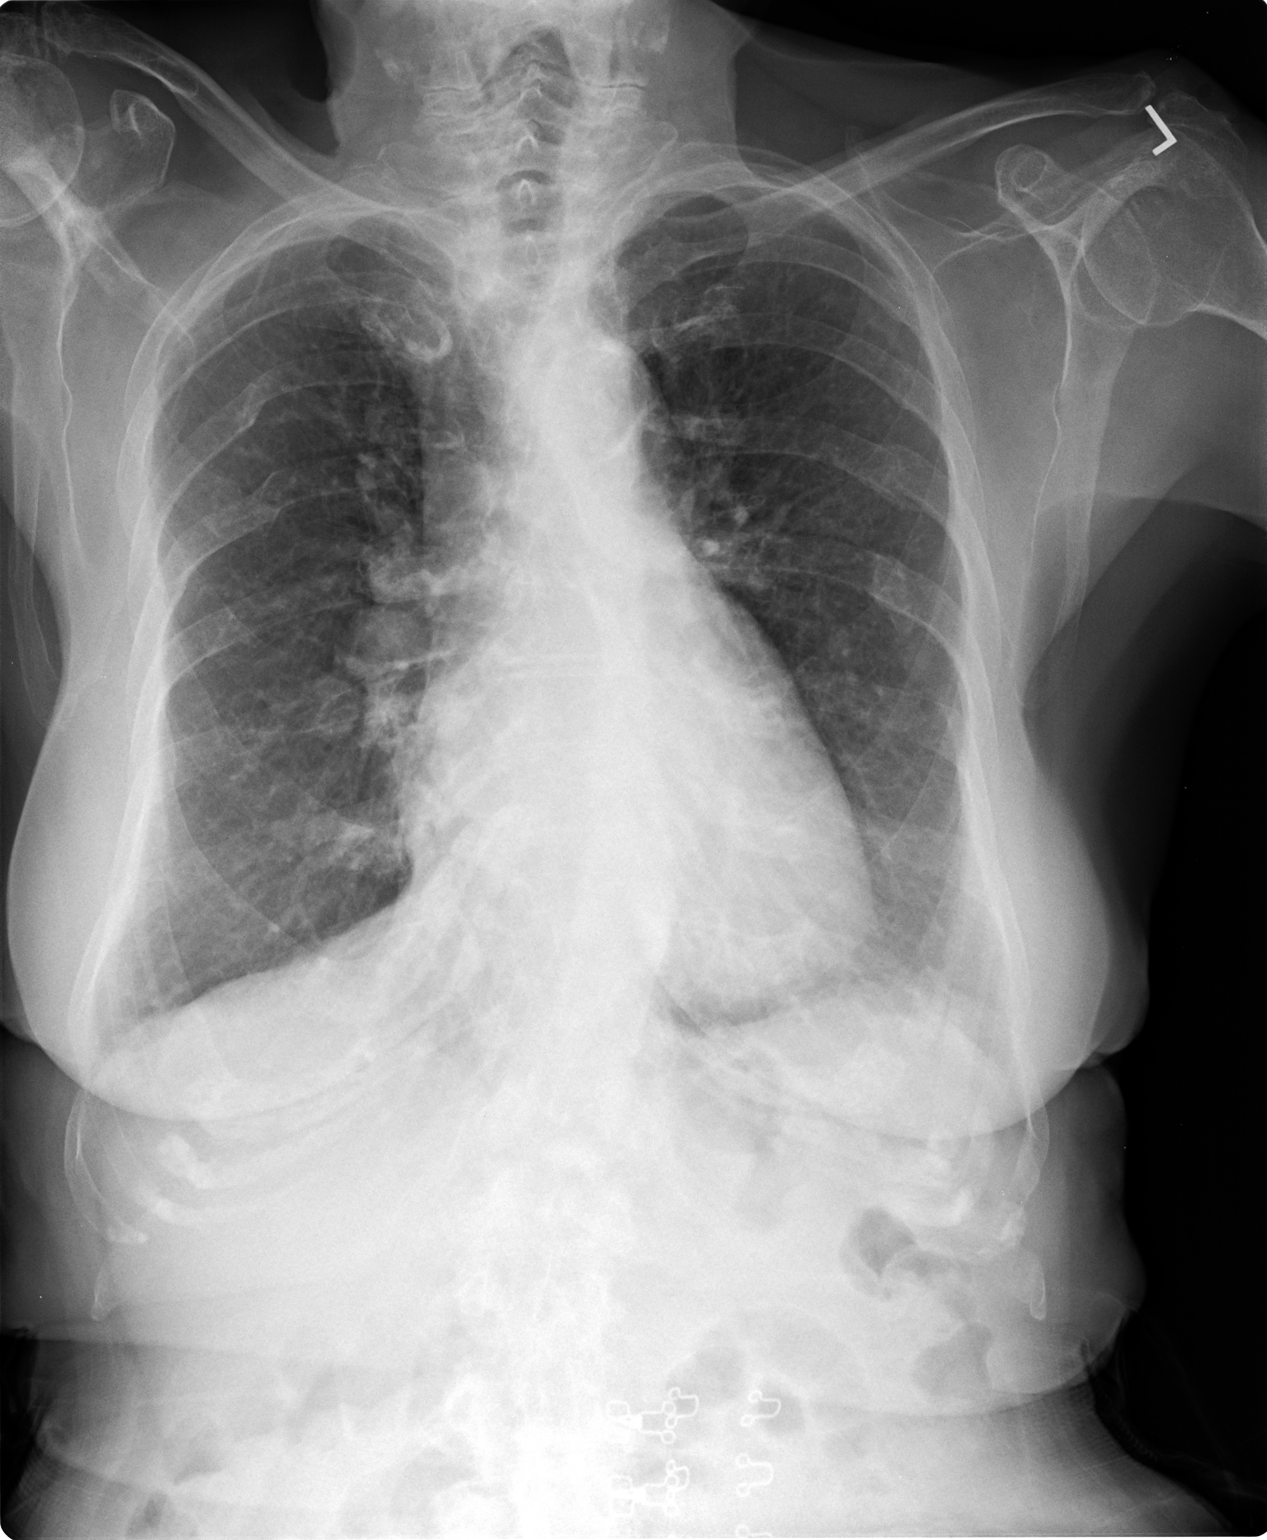

[view not recorded (2 of 2)]
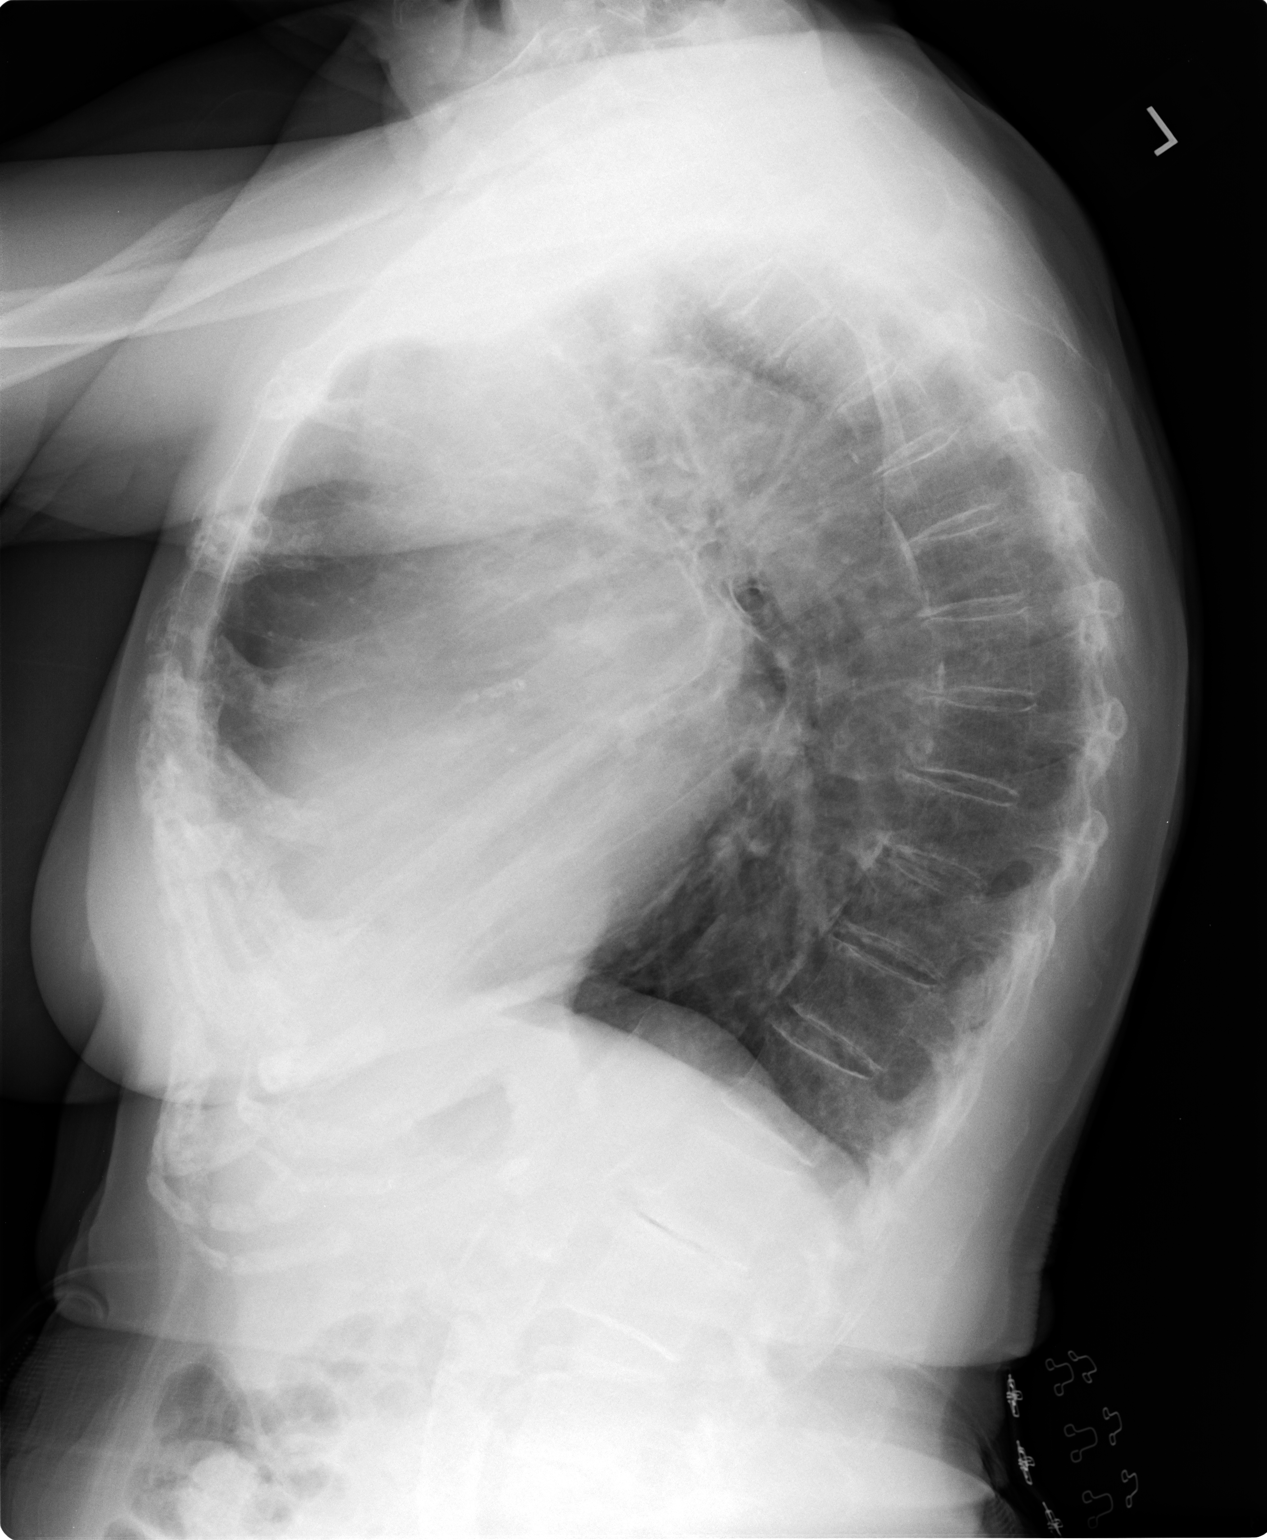

[2 of 2 positions shown; findings below may reference images not displayed]

FINDINGS: Mild cardiomegaly remains stable. Pulmonary hyperinflation is again
seen, consistent with COPD. Both lungs are clear. No evidence of
pneumothorax or pleural effusion. Multiple old bilateral rib
fracture deformities are again noted.
IMPRESSION: Stable cardiomegaly and COPD.  No active lung disease.

## 2015-08-01 ENCOUNTER — Other Ambulatory Visit: Payer: Self-pay | Admitting: Family Medicine

## 2015-08-25 ENCOUNTER — Ambulatory Visit (INDEPENDENT_AMBULATORY_CARE_PROVIDER_SITE_OTHER): Payer: Medicare Other | Admitting: Family Medicine

## 2015-08-25 ENCOUNTER — Encounter: Payer: Self-pay | Admitting: Family Medicine

## 2015-08-25 VITALS — BP 109/60 | HR 73 | Temp 98.1°F | Wt 126.2 lb

## 2015-08-25 DIAGNOSIS — F0390 Unspecified dementia without behavioral disturbance: Secondary | ICD-10-CM | POA: Diagnosis not present

## 2015-08-25 DIAGNOSIS — I2510 Atherosclerotic heart disease of native coronary artery without angina pectoris: Secondary | ICD-10-CM

## 2015-08-25 DIAGNOSIS — I10 Essential (primary) hypertension: Secondary | ICD-10-CM | POA: Diagnosis not present

## 2015-08-25 DIAGNOSIS — Z23 Encounter for immunization: Secondary | ICD-10-CM

## 2015-08-25 DIAGNOSIS — R350 Frequency of micturition: Secondary | ICD-10-CM

## 2015-08-25 DIAGNOSIS — IMO0001 Reserved for inherently not codable concepts without codable children: Secondary | ICD-10-CM

## 2015-08-25 DIAGNOSIS — M1990 Unspecified osteoarthritis, unspecified site: Secondary | ICD-10-CM

## 2015-08-25 DIAGNOSIS — I2583 Coronary atherosclerosis due to lipid rich plaque: Secondary | ICD-10-CM

## 2015-08-25 MED ORDER — AMLODIPINE BESYLATE 5 MG PO TABS: 5.0000 mg | ORAL_TABLET | Freq: Every day | ORAL | Status: AC

## 2015-08-25 MED ORDER — ISOSORBIDE MONONITRATE ER 30 MG PO TB24: 30.0000 mg | ORAL_TABLET | Freq: Every day | ORAL | Status: DC

## 2015-08-25 MED ORDER — ALENDRONATE SODIUM 70 MG PO TABS: ORAL_TABLET | ORAL | Status: DC

## 2015-08-25 MED ORDER — FUROSEMIDE 40 MG PO TABS: ORAL_TABLET | ORAL | Status: DC

## 2015-08-25 MED ORDER — ATENOLOL 25 MG PO TABS: ORAL_TABLET | ORAL | Status: DC

## 2015-08-25 MED ORDER — DONEPEZIL HCL 10 MG PO TABS: 10.0000 mg | ORAL_TABLET | Freq: Every evening | ORAL | Status: DC | PRN

## 2015-08-25 NOTE — Progress Notes (Signed)
Subjective:  Patient ID: Sue Schaefer, female    DOB: 1920/09/27  Age: 81 y.o. MRN: [id number]  CC: Hypertension; Hyperlipidemia; and Dementia   HPI Sue Schaefer presents for  follow-up of hypertension. Patient has no history of headache chest pain or shortness of breath or recent cough. Patient also denies symptoms of TIA such as numbness weakness lateralizing. Patient checks  blood pressure at home and has not had any elevated readings recently. Patient denies side effects from his medication. States taking it regularly.   Patient in for follow-up of elevated cholesterol. Doing well without complaints on current medication. Denies side effects of statin including myalgia and arthralgia and nausea. Also in today for liver function testing. Currently no chest pain, shortness of breath or other cardiovascular related symptoms noted.  Daughter says she can be forgetful at times but overall she is fairly sharp usually with current medication regimen. She does not walk much although she is able to ambulate about the house. She does use a wheelchair for trips outside the home including driving here today. Daughter is actively discussing with her the option of moving into some type of independent living situation. Currently she is living alone in her own home she relies on Meals on Wheels and family for her activities of daily living. She does do her own toileting and simple preparation of meals. Her daughter does her laundry for her.  History Sue Schaefer has a past medical history of Hypertension; URI (upper respiratory infection); Anemia; Memory loss; Osteoporosis; and Vitamin D deficiency.   She has no past surgical history on file.   Her family history is not on file.She reports that she has never smoked. She has never used smokeless tobacco. She reports that she does not drink alcohol or use illicit drugs.  Outpatient Prescriptions Prior to Visit  Medication Sig Dispense Refill  . Acai Berry 500  MG CAPS Take 1 capsule by mouth.    . Ascorbic Acid (VITAMIN C) 1000 MG tablet Take 1,000 mg by mouth daily.      Marland Kitchen aspirin 81 MG chewable tablet Chew 81 mg by mouth daily.      . B Complex-C-Min-Fe-FA (HEMATINIC PLUS COMPLEX PO) Take by mouth 2 (two) times daily.      . beclomethasone (QVAR) 80 MCG/ACT inhaler Inhale 2 puffs into the lungs daily. 1 Inhaler 12  . Calcium-Magnesium-Zinc 333-133-5 MG TABS Take 1 tablet by mouth.    . Calcium-Vitamin D 600-200 MG-UNIT per tablet Take 1 tablet by mouth.    . Cholecalciferol (VITAMIN D3) 10000 UNITS capsule Take 10,000 Units by mouth daily.      . citalopram (CELEXA) 10 MG tablet Take 1 tablet (10 mg total) by mouth daily. 30 tablet 11  . co-enzyme Q-10 30 MG capsule Take 30 mg by mouth daily.    Marland Kitchen docusate sodium (COLACE) 100 MG capsule Take 2 capsules (200 mg total) by mouth daily. 60 capsule 6  . ferrous sulfate 325 (65 FE) MG tablet Take 325 mg by mouth daily with breakfast.    . folic acid (FOLVITE) 244 MCG tablet Take 400 mcg by mouth daily.    . Garlic 0102 MG CAPS Take 1 capsule by mouth daily.    Marland Kitchen KRILL OIL PO Take 1 capsule by mouth daily.    . Lutein 6 MG CAPS Take by mouth daily.      . mirabegron ER (MYRBETRIQ) 50 MG TB24 tablet Take 1 tablet (50 mg total) by mouth at bedtime. [place]  tablet 11  . Multiple Vitamin (MULTIVITAMIN) capsule Take 4 capsules by mouth.    . mupirocin ointment (BACTROBAN) 2 % Small amount to AA of nose TID x 14 days 30 g 0  . NAMENDA XR 28 MG CP24 24 hr capsule TAKE (1) CAPSULE DAILY 30 capsule 5  . NIFEDICAL XL 30 MG 24 hr tablet     . nitroGLYCERIN (NITROSTAT) 0.4 MG SL tablet Place 0.4 mg under the tongue every 5 (five) minutes as needed.      . Omega-3 1000 MG CAPS Take 1 capsule by mouth.    . polycarbophil (FIBERCON) 625 MG tablet Take 625 mg by mouth 2 (two) times daily.      Vladimir Faster Glycol-Propyl Glycol (SYSTANE) 0.4-0.3 % SOLN Apply 1 drop to eye 4 (four) times daily.    . potassium chloride SA  (K-DUR,KLOR-CON) 20 MEQ tablet TAKE 1 TABLET DAILY 30 tablet 5  . prednisoLONE acetate (PRED FORTE) 1 % ophthalmic suspension Place 1 drop into the right eye 2 (two) times daily.    . Turmeric (RA TURMERIC) 500 MG CAPS Take 1 capsule by mouth.    . vitamin E 400 UNIT capsule Take 400 Units by mouth daily.      Marland Kitchen alendronate (FOSAMAX) 70 MG tablet TAKE 1 TABLET ONCE A WEEK 4 tablet 5  . amLODipine (NORVASC) 5 MG tablet Take 1 tablet (5 mg total) by mouth daily. 30 tablet 3  . atenolol (TENORMIN) 25 MG tablet TAKE 1 TABLET ONCE A DAY 30 tablet 5  . donepezil (ARICEPT) 10 MG tablet TAKE 1 TABLET AT BEDTIME AS NEEDED 30 tablet 2  . furosemide (LASIX) 40 MG tablet TAKE 1 TABLET DAILY 30 tablet 5  . isosorbide mononitrate (IMDUR) 30 MG 24 hr tablet TAKE 1 TABLET DAILY 30 tablet 4  . ciprofloxacin (CIPRO) 500 MG tablet Take 1 tablet (500 mg total) by mouth 2 (two) times daily. (Patient not taking: Reported on 08/25/2015) 20 tablet 0   No facility-administered medications prior to visit.    ROS Review of Systems  Constitutional: Negative for fever, chills, diaphoresis, appetite change, fatigue and unexpected weight change.  HENT: Negative for congestion, ear pain, hearing loss, postnasal drip, rhinorrhea, sneezing, sore throat and trouble swallowing.   Eyes: Negative for pain.  Respiratory: Negative for cough, chest tightness and shortness of breath.   Cardiovascular: Negative for chest pain and palpitations.  Gastrointestinal: Negative for nausea, vomiting, abdominal pain, diarrhea and constipation.  Genitourinary: Negative for dysuria, frequency and menstrual problem.  Musculoskeletal: Positive for arthralgias.  Skin: Negative for rash.  Neurological: Negative for dizziness, weakness, numbness and headaches.  Psychiatric/Behavioral: Negative for dysphoric mood and agitation.    Objective:  BP 109/60 mmHg  Pulse 73  Temp(Src) 98.1 F (36.7 C) (Oral)  Wt 126 lb 3.2 oz (57.244 kg)  BP  Readings from Last 3 Encounters:  08/25/15 109/60  05/14/15 123/54  04/14/15 136/74    Wt Readings from Last 3 Encounters:  08/25/15 126 lb 3.2 oz (57.244 kg)  04/14/15 132 lb 12.8 oz (60.238 kg)  10/25/14 131 lb 3.2 oz (59.512 kg)     Physical Exam  Constitutional: She appears well-developed. No distress.  HENT:  Head: Normocephalic and atraumatic.  Neck: No JVD present. No thyromegaly present.  Cardiovascular: Normal rate and normal heart sounds.   No murmur heard. Pulmonary/Chest: No respiratory distress.  Abdominal: Soft. There is no tenderness.  Musculoskeletal: She exhibits no edema or tenderness.  Neurological: She is  alert.  Skin: Skin is warm and dry. No erythema.  Psychiatric: She has a normal mood and affect.    No results found for: HGBA1C  Lab Results  Component Value Date   WBC 6.7 08/25/2015   HGB 11.3* 04/14/2015   HCT 38.8 08/25/2015   GLUCOSE 97 08/25/2015   CHOL 187 08/25/2015   TRIG 201* 08/25/2015   HDL 70 08/25/2015   LDLCALC 77 08/25/2015   ALT 18 08/25/2015   AST 28 08/25/2015   NA 140 08/25/2015   K 4.5 08/25/2015   CL 100 08/25/2015   CREATININE 1.27* 08/25/2015   BUN 30 08/25/2015   CO2 25 08/25/2015   TSH 1.020 01/21/2014    No results found.  Assessment & Plan:   Sue Schaefer was seen today for hypertension, hyperlipidemia and dementia.  Diagnoses and all orders for this visit:  Essential hypertension, benign -     furosemide (LASIX) 40 MG tablet; TAKE 1 TABLET DAILY -     CBC with Differential/Platelet -     CMP14+EGFR -     Lipid panel  Coronary artery disease due to lipid rich plaque -     atenolol (TENORMIN) 25 MG tablet; TAKE 1 TABLET ONCE A DAY -     CBC with Differential/Platelet -     CMP14+EGFR -     Lipid panel  Dementia, without behavioral disturbance -     CBC with Differential/Platelet -     CMP14+EGFR -     Lipid panel  Arthritis -     CBC with Differential/Platelet -     CMP14+EGFR -     Lipid  panel  Frequency -     CBC with Differential/Platelet -     CMP14+EGFR -     Lipid panel  Encounter for immunization  Other orders -     isosorbide mononitrate (IMDUR) 30 MG 24 hr tablet; Take 1 tablet (30 mg total) by mouth daily. -     donepezil (ARICEPT) 10 MG tablet; Take 1 tablet (10 mg total) by mouth at bedtime as needed. -     amLODipine (NORVASC) 5 MG tablet; Take 1 tablet (5 mg total) by mouth daily. -     alendronate (FOSAMAX) 70 MG tablet; TAKE 1 TABLET ONCE A WEEK -     Flu Vaccine QUAD 36+ mos IM   I have discontinued Ms. Blinn's ciprofloxacin. I have also changed her isosorbide mononitrate and donepezil. Additionally, I am having her maintain her nitroGLYCERIN, aspirin, B Complex-C-Min-Fe-FA (HEMATINIC PLUS COMPLEX PO), vitamin C, vitamin E, polycarbophil, Vitamin D3, Lutein, KRILL OIL PO, co-enzyme Q-10, ferrous sulfate, folic acid, Garlic, Polyethyl Glycol-Propyl Glycol, prednisoLONE acetate, NIFEDICAL XL, Acai Berry, Calcium-Vitamin D, Calcium-Magnesium-Zinc, multivitamin, Omega-3, Turmeric, citalopram, docusate sodium, mirabegron ER, beclomethasone, NAMENDA XR, mupirocin ointment, potassium chloride SA, furosemide, atenolol, amLODipine, and alendronate.  Meds ordered this encounter  Medications  . isosorbide mononitrate (IMDUR) 30 MG 24 hr tablet    Sig: Take 1 tablet (30 mg total) by mouth daily.    Dispense:  30 tablet    Refill:  5  . furosemide (LASIX) 40 MG tablet    Sig: TAKE 1 TABLET DAILY    Dispense:  30 tablet    Refill:  5  . donepezil (ARICEPT) 10 MG tablet    Sig: Take 1 tablet (10 mg total) by mouth at bedtime as needed.    Dispense:  30 tablet    Refill:  2  . atenolol (TENORMIN) 25 MG tablet  Sig: TAKE 1 TABLET ONCE A DAY    Dispense:  30 tablet    Refill:  5  . amLODipine (NORVASC) 5 MG tablet    Sig: Take 1 tablet (5 mg total) by mouth daily.    Dispense:  30 tablet    Refill:  3  . alendronate (FOSAMAX) 70 MG tablet    Sig: TAKE 1  TABLET ONCE A WEEK    Dispense:  4 tablet    Refill:  5     Follow-up: Return in about 3 months (around 11/25/2015).  Claretta Fraise, M.D.

## 2015-08-26 LAB — CMP14+EGFR
ALBUMIN: 4.2 g/dL (ref 3.2–4.6)
ALK PHOS: 76 IU/L (ref 39–117)
ALT: 18 IU/L (ref 0–32)
AST: 28 IU/L (ref 0–40)
Albumin/Globulin Ratio: 1.8 (ref 1.1–2.5)
BILIRUBIN TOTAL: 0.3 mg/dL (ref 0.0–1.2)
BUN / CREAT RATIO: 24 (ref 11–26)
BUN: 30 mg/dL (ref 10–36)
CHLORIDE: 100 mmol/L (ref 97–106)
CO2: 25 mmol/L (ref 18–29)
Calcium: 9.5 mg/dL (ref 8.7–10.3)
Creatinine, Ser: 1.27 mg/dL — ABNORMAL HIGH (ref 0.57–1.00)
GFR calc Af Amer: 42 mL/min/{1.73_m2} — ABNORMAL LOW (ref >59)
GFR calc non Af Amer: 36 mL/min/{1.73_m2} — ABNORMAL LOW (ref >59)
GLUCOSE: 97 mg/dL (ref 65–99)
Globulin, Total: 2.4 g/dL (ref 1.5–4.5)
POTASSIUM: 4.5 mmol/L (ref 3.5–5.2)
Sodium: 140 mmol/L (ref 136–144)
Total Protein: 6.6 g/dL (ref 6.0–8.5)

## 2015-08-26 LAB — CBC WITH DIFFERENTIAL/PLATELET
BASOS ABS: 0.1 10*3/uL (ref 0.0–0.2)
Basos: 1 %
EOS (ABSOLUTE): 0.2 10*3/uL (ref 0.0–0.4)
EOS: 2 %
HEMOGLOBIN: 12.7 g/dL (ref 11.1–15.9)
Hematocrit: 38.8 % (ref 34.0–46.6)
IMMATURE GRANS (ABS): 0 10*3/uL (ref 0.0–0.1)
IMMATURE GRANULOCYTES: 0 %
LYMPHS: 26 %
Lymphocytes Absolute: 1.7 10*3/uL (ref 0.7–3.1)
MCH: 28.3 pg (ref 26.6–33.0)
MCHC: 32.7 g/dL (ref 31.5–35.7)
MCV: 86 fL (ref 79–97)
MONOCYTES: 10 %
Monocytes Absolute: 0.6 10*3/uL (ref 0.1–0.9)
Neutrophils Absolute: 4.1 10*3/uL (ref 1.4–7.0)
Neutrophils: 61 %
Platelets: 195 10*3/uL (ref 150–379)
RBC: 4.49 x10E6/uL (ref 3.77–5.28)
RDW: 14.6 % (ref 12.3–15.4)
WBC: 6.7 10*3/uL (ref 3.4–10.8)

## 2015-08-26 LAB — LIPID PANEL
CHOLESTEROL TOTAL: 187 mg/dL (ref 100–199)
Chol/HDL Ratio: 2.7 ratio units (ref 0.0–4.4)
HDL: 70 mg/dL (ref >39)
LDL Calculated: 77 mg/dL (ref 0–99)
Triglycerides: 201 mg/dL — ABNORMAL HIGH (ref 0–149)
VLDL CHOLESTEROL CAL: 40 mg/dL (ref 5–40)

## 2015-09-03 ENCOUNTER — Other Ambulatory Visit (INDEPENDENT_AMBULATORY_CARE_PROVIDER_SITE_OTHER): Payer: Medicare Other

## 2015-09-03 ENCOUNTER — Other Ambulatory Visit: Payer: Self-pay | Admitting: *Deleted

## 2015-09-03 DIAGNOSIS — R300 Dysuria: Secondary | ICD-10-CM

## 2015-09-03 LAB — POCT URINALYSIS DIPSTICK
BILIRUBIN UA: NEGATIVE
GLUCOSE UA: NEGATIVE
Ketones, UA: NEGATIVE
Leukocytes, UA: NEGATIVE
NITRITE UA: NEGATIVE
Protein, UA: NEGATIVE
RBC UA: NEGATIVE
SPEC GRAV UA: 1.01
Urobilinogen, UA: NEGATIVE
pH, UA: 6

## 2015-09-03 LAB — POCT UA - MICROSCOPIC ONLY
Bacteria, U Microscopic: NEGATIVE
CRYSTALS, UR, HPF, POC: NEGATIVE
Casts, Ur, LPF, POC: NEGATIVE
MUCUS UA: NEGATIVE
WBC, UR, HPF, POC: NEGATIVE
Yeast, UA: NEGATIVE

## 2015-09-03 NOTE — Progress Notes (Signed)
Lab only 

## 2015-10-27 ENCOUNTER — Other Ambulatory Visit: Payer: Self-pay | Admitting: Family Medicine

## 2016-01-06 ENCOUNTER — Ambulatory Visit (INDEPENDENT_AMBULATORY_CARE_PROVIDER_SITE_OTHER): Payer: Medicare HMO

## 2016-01-06 ENCOUNTER — Encounter: Payer: Self-pay | Admitting: Family Medicine

## 2016-01-06 ENCOUNTER — Ambulatory Visit (INDEPENDENT_AMBULATORY_CARE_PROVIDER_SITE_OTHER): Payer: Medicare HMO | Admitting: Family Medicine

## 2016-01-06 VITALS — BP 128/62 | HR 78 | Temp 96.8°F | Ht 60.0 in | Wt 119.0 lb

## 2016-01-06 DIAGNOSIS — R69 Illness, unspecified: Secondary | ICD-10-CM | POA: Diagnosis not present

## 2016-01-06 DIAGNOSIS — S52202A Unspecified fracture of shaft of left ulna, initial encounter for closed fracture: Secondary | ICD-10-CM

## 2016-01-06 DIAGNOSIS — I10 Essential (primary) hypertension: Secondary | ICD-10-CM | POA: Diagnosis not present

## 2016-01-06 DIAGNOSIS — S40022A Contusion of left upper arm, initial encounter: Secondary | ICD-10-CM | POA: Diagnosis not present

## 2016-01-06 DIAGNOSIS — F05 Delirium due to known physiological condition: Secondary | ICD-10-CM | POA: Diagnosis not present

## 2016-01-06 DIAGNOSIS — R350 Frequency of micturition: Secondary | ICD-10-CM | POA: Diagnosis not present

## 2016-01-06 LAB — POCT UA - MICROSCOPIC ONLY
CASTS, UR, LPF, POC: NEGATIVE
CRYSTALS, UR, HPF, POC: NEGATIVE
Mucus, UA: NEGATIVE
YEAST UA: NEGATIVE

## 2016-01-06 LAB — POCT URINALYSIS DIPSTICK
Bilirubin, UA: NEGATIVE
Glucose, UA: NEGATIVE
KETONES UA: NEGATIVE
Nitrite, UA: NEGATIVE
PH UA: 5
PROTEIN UA: NEGATIVE
SPEC GRAV UA: 1.02
UROBILINOGEN UA: NEGATIVE

## 2016-01-06 MED ORDER — CIPROFLOXACIN HCL 250 MG PO TABS: 250.0000 mg | ORAL_TABLET | Freq: Two times a day (BID) | ORAL | Status: DC

## 2016-01-06 NOTE — Addendum Note (Signed)
Addended by: Selmer Dominion on: 01/06/2016 06:26 PM   Modules accepted: Orders

## 2016-01-06 NOTE — Patient Instructions (Signed)
  Do not use the silver preparation, but the vitamin supplement should be fine to take daily.

## 2016-01-06 NOTE — Progress Notes (Signed)
Subjective:  Patient ID: Sue Schaefer, female    DOB: [birth year]  Age: 82 y.o. MRN: [id number]  CC: Depression; Dementia; and Hypertension   HPI Sue Schaefer presents for  follow-up of hypertension. Patient has no history of headache chest pain or shortness of breath or recent cough. Patient also denies symptoms of TIA such as numbness weakness lateralizing. Patient checks  blood pressure at home and has not had any elevated readings recently. Patient denies side effects from his medication. States taking it regularly.   burning with urination and frequency for several days. Denies fever . No flank pain. No nausea, vomiting.  Has had flu symptoms. Using TheraFlu and other over the counters and beginning to feel better. She has also been having weird, upsetting dreams about having to spend the night in the [place] after burying a friend.  She has fallen 3 times recently. The only time there has been an injury of significance was on February 1. She fell bruising her left arm. The daughter brings in a picture today on her eye phone showing hematoma formation of the ventral forearm reaching from the heel of the palm all the way to the antecubital fossa. Patient says it is started hurting more and she is concerned broken.  Her daughter also expresses concern that she is ordering a lot of supplements through the mail from advertisements from the television. There is one with her today that is merely a vitamin and mineral supplement. There is another that is temperature of silver.   History Sue Schaefer has a past medical history of Hypertension; URI (upper respiratory infection); Anemia; Memory loss; Osteoporosis; and Vitamin D deficiency.   She has no past surgical history on file.   Her family history is not on file.She reports that she has never smoked. She has never used smokeless tobacco. She reports that she does not drink alcohol or use illicit drugs.  Current Outpatient Prescriptions on File  Prior to Visit  Medication Sig Dispense Refill  . alendronate (FOSAMAX) 70 MG tablet TAKE 1 TABLET ONCE A WEEK 4 tablet 5  . amLODipine (NORVASC) 5 MG tablet Take 1 tablet (5 mg total) by mouth daily. 30 tablet 3  . atenolol (TENORMIN) 25 MG tablet TAKE 1 TABLET ONCE A DAY 30 tablet 5  . donepezil (ARICEPT) 10 MG tablet Take 1 tablet (10 mg total) by mouth at bedtime as needed. 30 tablet 2  . NAMENDA XR 28 MG CP24 24 hr capsule TAKE (1) CAPSULE DAILY 30 capsule 5  . Polyethyl Glycol-Propyl Glycol (SYSTANE) 0.4-0.3 % SOLN Apply 1 drop to eye 4 (four) times daily.    . potassium chloride SA (K-DUR,KLOR-CON) 20 MEQ tablet TAKE 1 TABLET DAILY 30 tablet 5  . Acai Berry 500 MG CAPS Take 1 capsule by mouth.    . Ascorbic Acid (VITAMIN C) 1000 MG tablet Take 1,000 mg by mouth daily.      Marland Kitchen aspirin 81 MG chewable tablet Chew 81 mg by mouth daily.      . B Complex-C-Min-Fe-FA (HEMATINIC PLUS COMPLEX PO) Take by mouth 2 (two) times daily.      . beclomethasone (QVAR) 80 MCG/ACT inhaler Inhale 2 puffs into the lungs daily. 1 Inhaler 12  . Calcium-Magnesium-Zinc 333-133-5 MG TABS Take 1 tablet by mouth.    . Calcium-Vitamin D 600-200 MG-UNIT per tablet Take 1 tablet by mouth.    . citalopram (CELEXA) 10 MG tablet Take 1 tablet (10 mg total) by mouth daily. 30 tablet 11  .  co-enzyme Q-10 30 MG capsule Take 30 mg by mouth daily.    Marland Kitchen docusate sodium (COLACE) 100 MG capsule Take 2 capsules (200 mg total) by mouth daily. 60 capsule 6  . folic acid (FOLVITE) 944 MCG tablet Take 400 mcg by mouth daily.    . furosemide (LASIX) 40 MG tablet TAKE 1 TABLET DAILY 30 tablet 5  . Garlic 9675 MG CAPS Take 1 capsule by mouth daily.    . isosorbide mononitrate (IMDUR) 30 MG 24 hr tablet Take 1 tablet (30 mg total) by mouth daily. 30 tablet 5  . KRILL OIL PO Take 1 capsule by mouth daily.    . Lutein 6 MG CAPS Take by mouth daily.      . mupirocin ointment (BACTROBAN) 2 % Small amount to AA of nose TID x 14 days 30 g 0    . MYRBETRIQ 50 MG TB24 tablet TAKE ONE TABLET AT BEDTIME 30 tablet 1  . NIFEDICAL XL 30 MG 24 hr tablet Reported on 01/06/2016    . nitroGLYCERIN (NITROSTAT) 0.4 MG SL tablet Place 0.4 mg under the tongue every 5 (five) minutes as needed.      . Omega-3 1000 MG CAPS Take 1 capsule by mouth.    . polycarbophil (FIBERCON) 625 MG tablet Take 625 mg by mouth 2 (two) times daily.      . prednisoLONE acetate (PRED FORTE) 1 % ophthalmic suspension Place 1 drop into the right eye 2 (two) times daily.    . Turmeric (RA TURMERIC) 500 MG CAPS Take 1 capsule by mouth.     No current facility-administered medications on file prior to visit.     ROS Review of Systems  Constitutional: Negative for fever, chills, diaphoresis, activity change and appetite change.  HENT: Negative for congestion, rhinorrhea and sore throat.   Eyes: Negative for visual disturbance.  Respiratory: Negative for cough and shortness of breath.   Cardiovascular: Negative for chest pain and palpitations.  Gastrointestinal: Negative for nausea, abdominal pain, diarrhea and constipation.  Genitourinary: Positive for dysuria, urgency and frequency. Negative for hematuria, flank pain, decreased urine volume, menstrual problem and pelvic pain.  Musculoskeletal: Positive for arthralgias. Negative for myalgias and joint swelling.  Skin: Negative for rash.  Neurological: Negative for dizziness and numbness.  Psychiatric/Behavioral: Positive for confusion, sleep disturbance and dysphoric mood. The patient is nervous/anxious.     Objective:  BP 128/62 mmHg  Pulse 78  Temp(Src) 96.8 F (36 C) (Oral)  Ht 5' (1.524 m)  Wt 119 lb (53.978 kg)  BMI 23.24 kg/m2  SpO2 98%  BP Readings from Last 3 Encounters:  01/06/16 128/62  08/25/15 109/60  05/14/15 123/54    Wt Readings from Last 3 Encounters:  01/06/16 119 lb (53.978 kg)  08/25/15 126 lb 3.2 oz (57.244 kg)  04/14/15 132 lb 12.8 oz (60.238 kg)     Physical Exam   Constitutional: She is oriented to person, place, and time. She appears well-developed and well-nourished. No distress.  HENT:  Head: Normocephalic and atraumatic.  Right Ear: External ear normal.  Left Ear: External ear normal.  Nose: Nose normal.  Mouth/Throat: Oropharynx is clear and moist.  Eyes: Conjunctivae and EOM are normal. Pupils are equal, round, and reactive to light.  Neck: Normal range of motion. Neck supple. No thyromegaly present.  Cardiovascular: Normal rate, regular rhythm and normal heart sounds.   No murmur heard. Pulmonary/Chest: Effort normal and breath sounds normal. No respiratory distress. She has no wheezes. She has  no rales.  Abdominal: Soft. Bowel sounds are normal. She exhibits no distension and no mass. There is no tenderness. There is no rebound and no guarding.  Musculoskeletal: She exhibits no tenderness.  Full range of motion at the left elbow and wrist both for passive and active range of motion.  Patient is wheelchair bound today. She does walk with a walker.  Lymphadenopathy:    She has no cervical adenopathy.  Neurological: She is alert and oriented to person, place, and time. She has normal reflexes.  Skin: Skin is warm and dry.  The hematoma mentioned in the history has completely resolved in the 4 weeks since that incident.  Psychiatric: She has a normal mood and affect. Her behavior is normal. Judgment and thought content normal.     Lab Results  Component Value Date   WBC 6.7 08/25/2015   HGB 11.3* 04/14/2015   HCT 38.8 08/25/2015   PLT 195 08/25/2015   GLUCOSE 97 08/25/2015   CHOL 187 08/25/2015   TRIG 201* 08/25/2015   HDL 70 08/25/2015   LDLCALC 77 08/25/2015   ALT 18 08/25/2015   AST 28 08/25/2015   NA 140 08/25/2015   K 4.5 08/25/2015   CL 100 08/25/2015   CREATININE 1.27* 08/25/2015   BUN 30 08/25/2015   CO2 25 08/25/2015   TSH 1.020 01/21/2014    No results found.  Assessment & Plan:   Sonjia was seen today for  depression, dementia and hypertension.  Diagnoses and all orders for this visit:  Urinary frequency -     POCT UA - Microscopic Only -     POCT urinalysis dipstick -     CBC with Differential/Platelet -     CMP14+EGFR  Arm contusion, left, initial encounter -     DG Forearm Left; Future -     Cancel: DG Wrist Complete Left; Future -     CMP14+EGFR  Essential hypertension, benign -     CMP14+EGFR -     TSH + free T4  Acute confusional state -     TSH + free T4  Other orders -     ciprofloxacin (CIPRO) 250 MG tablet; Take 1 tablet (250 mg total) by mouth 2 (two) times daily.    I have discontinued Ms. Kras's vitamin E, Vitamin D3, ferrous sulfate, and multivitamin. I am also having her start on ciprofloxacin. Additionally, I am having her maintain her nitroGLYCERIN, aspirin, B Complex-C-Min-Fe-FA (HEMATINIC PLUS COMPLEX PO), vitamin C, polycarbophil, Lutein, KRILL OIL PO, co-enzyme Q-76, folic acid, Garlic, Polyethyl Glycol-Propyl Glycol, prednisoLONE acetate, NIFEDICAL XL, Acai Berry, Calcium-Vitamin D, Calcium-Magnesium-Zinc, Omega-3, Turmeric, citalopram, docusate sodium, beclomethasone, NAMENDA XR, mupirocin ointment, potassium chloride SA, isosorbide mononitrate, furosemide, donepezil, atenolol, amLODipine, alendronate, and MYRBETRIQ.  Meds ordered this encounter  Medications  . ciprofloxacin (CIPRO) 250 MG tablet    Sig: Take 1 tablet (250 mg total) by mouth 2 (two) times daily.    Dispense:  10 tablet    Refill:  0    Do not use the silver preparation, but the vitamin supplement should be fine to take daily. Follow-up: No Follow-up on file.  Claretta Fraise, M.D.

## 2016-01-07 DIAGNOSIS — S52222A Displaced transverse fracture of shaft of left ulna, initial encounter for closed fracture: Secondary | ICD-10-CM | POA: Diagnosis not present

## 2016-01-19 ENCOUNTER — Other Ambulatory Visit: Payer: Self-pay | Admitting: Family Medicine

## 2016-01-27 ENCOUNTER — Telehealth: Payer: Self-pay | Admitting: Family Medicine

## 2016-02-04 ENCOUNTER — Other Ambulatory Visit: Payer: Self-pay | Admitting: Family Medicine

## 2016-02-04 NOTE — Telephone Encounter (Signed)
Patient is going to [facility] and don't need information.

## 2016-02-06 ENCOUNTER — Telehealth: Payer: Self-pay

## 2016-02-06 ENCOUNTER — Telehealth: Payer: Self-pay | Admitting: Family Medicine

## 2016-02-06 ENCOUNTER — Other Ambulatory Visit: Payer: Self-pay | Admitting: Nurse Practitioner

## 2016-02-06 DIAGNOSIS — F0391 Unspecified dementia with behavioral disturbance: Secondary | ICD-10-CM

## 2016-02-06 DIAGNOSIS — F03918 Unspecified dementia, unspecified severity, with other behavioral disturbance: Secondary | ICD-10-CM

## 2016-02-06 MED ORDER — TRANSPORT CHAIR MISC: 1.0000 | Freq: Every day | Status: DC

## 2016-02-06 NOTE — Telephone Encounter (Signed)
Sue Schaefer spoke with the patient and gave her the paperwork she needed

## 2016-02-06 NOTE — Telephone Encounter (Signed)
Please write and I will sign.  WS

## 2016-02-06 NOTE — Telephone Encounter (Signed)
Rx printed and faxed to [facility] per request

## 2016-02-06 NOTE — Telephone Encounter (Signed)
Are these ok to write?  

## 2016-02-06 NOTE — Telephone Encounter (Signed)
Patient's daughter is here for a wheelchair prescription for [place].  Needs today for her to get around at [facility].

## 2016-02-10 DIAGNOSIS — S52222A Displaced transverse fracture of shaft of left ulna, initial encounter for closed fracture: Secondary | ICD-10-CM | POA: Diagnosis not present

## 2016-02-26 ENCOUNTER — Telehealth: Payer: Self-pay | Admitting: Family Medicine

## 2016-02-26 DIAGNOSIS — M6281 Muscle weakness (generalized): Secondary | ICD-10-CM

## 2016-02-26 NOTE — Telephone Encounter (Signed)
That sounds like a great idea. If you can, please cal that in as a verbal freom me. If not, write it and I'll sign. Thanks, WS

## 2016-03-08 DIAGNOSIS — Z9181 History of falling: Secondary | ICD-10-CM | POA: Diagnosis not present

## 2016-03-08 DIAGNOSIS — R69 Illness, unspecified: Secondary | ICD-10-CM | POA: Diagnosis not present

## 2016-03-08 DIAGNOSIS — R350 Frequency of micturition: Secondary | ICD-10-CM | POA: Diagnosis not present

## 2016-03-08 DIAGNOSIS — M6281 Muscle weakness (generalized): Secondary | ICD-10-CM | POA: Diagnosis not present

## 2016-03-08 DIAGNOSIS — I10 Essential (primary) hypertension: Secondary | ICD-10-CM | POA: Diagnosis not present

## 2016-03-08 DIAGNOSIS — Z7982 Long term (current) use of aspirin: Secondary | ICD-10-CM | POA: Diagnosis not present

## 2016-03-12 DIAGNOSIS — R350 Frequency of micturition: Secondary | ICD-10-CM | POA: Diagnosis not present

## 2016-03-12 DIAGNOSIS — Z9181 History of falling: Secondary | ICD-10-CM | POA: Diagnosis not present

## 2016-03-12 DIAGNOSIS — R69 Illness, unspecified: Secondary | ICD-10-CM | POA: Diagnosis not present

## 2016-03-12 DIAGNOSIS — Z7982 Long term (current) use of aspirin: Secondary | ICD-10-CM | POA: Diagnosis not present

## 2016-03-12 DIAGNOSIS — I10 Essential (primary) hypertension: Secondary | ICD-10-CM | POA: Diagnosis not present

## 2016-03-12 DIAGNOSIS — M6281 Muscle weakness (generalized): Secondary | ICD-10-CM | POA: Diagnosis not present

## 2016-03-16 DIAGNOSIS — M6281 Muscle weakness (generalized): Secondary | ICD-10-CM | POA: Diagnosis not present

## 2016-03-16 DIAGNOSIS — Z7982 Long term (current) use of aspirin: Secondary | ICD-10-CM | POA: Diagnosis not present

## 2016-03-16 DIAGNOSIS — R350 Frequency of micturition: Secondary | ICD-10-CM | POA: Diagnosis not present

## 2016-03-16 DIAGNOSIS — R69 Illness, unspecified: Secondary | ICD-10-CM | POA: Diagnosis not present

## 2016-03-16 DIAGNOSIS — Z9181 History of falling: Secondary | ICD-10-CM | POA: Diagnosis not present

## 2016-03-16 DIAGNOSIS — I10 Essential (primary) hypertension: Secondary | ICD-10-CM | POA: Diagnosis not present

## 2016-03-18 DIAGNOSIS — R69 Illness, unspecified: Secondary | ICD-10-CM | POA: Diagnosis not present

## 2016-03-18 DIAGNOSIS — R350 Frequency of micturition: Secondary | ICD-10-CM | POA: Diagnosis not present

## 2016-03-18 DIAGNOSIS — Z9181 History of falling: Secondary | ICD-10-CM | POA: Diagnosis not present

## 2016-03-18 DIAGNOSIS — M6281 Muscle weakness (generalized): Secondary | ICD-10-CM | POA: Diagnosis not present

## 2016-03-18 DIAGNOSIS — I10 Essential (primary) hypertension: Secondary | ICD-10-CM | POA: Diagnosis not present

## 2016-03-18 DIAGNOSIS — Z7982 Long term (current) use of aspirin: Secondary | ICD-10-CM | POA: Diagnosis not present

## 2016-03-24 DIAGNOSIS — R350 Frequency of micturition: Secondary | ICD-10-CM | POA: Diagnosis not present

## 2016-03-24 DIAGNOSIS — M6281 Muscle weakness (generalized): Secondary | ICD-10-CM | POA: Diagnosis not present

## 2016-03-24 DIAGNOSIS — I10 Essential (primary) hypertension: Secondary | ICD-10-CM | POA: Diagnosis not present

## 2016-03-24 DIAGNOSIS — R69 Illness, unspecified: Secondary | ICD-10-CM | POA: Diagnosis not present

## 2016-03-24 DIAGNOSIS — Z9181 History of falling: Secondary | ICD-10-CM | POA: Diagnosis not present

## 2016-03-24 DIAGNOSIS — Z7982 Long term (current) use of aspirin: Secondary | ICD-10-CM | POA: Diagnosis not present

## 2016-04-13 DIAGNOSIS — S52222A Displaced transverse fracture of shaft of left ulna, initial encounter for closed fracture: Secondary | ICD-10-CM | POA: Diagnosis not present

## 2016-05-07 ENCOUNTER — Ambulatory Visit (INDEPENDENT_AMBULATORY_CARE_PROVIDER_SITE_OTHER): Payer: Medicare HMO | Admitting: Family Medicine

## 2016-05-07 DIAGNOSIS — F329 Major depressive disorder, single episode, unspecified: Secondary | ICD-10-CM

## 2016-05-07 DIAGNOSIS — M6281 Muscle weakness (generalized): Secondary | ICD-10-CM

## 2016-05-07 DIAGNOSIS — R69 Illness, unspecified: Secondary | ICD-10-CM | POA: Diagnosis not present

## 2016-05-07 DIAGNOSIS — F0390 Unspecified dementia without behavioral disturbance: Secondary | ICD-10-CM | POA: Diagnosis not present

## 2016-05-07 DIAGNOSIS — I10 Essential (primary) hypertension: Secondary | ICD-10-CM

## 2016-05-21 ENCOUNTER — Telehealth: Payer: Self-pay | Admitting: Family Medicine

## 2016-05-24 NOTE — Telephone Encounter (Signed)
[employer] refaxed form

## 2016-06-23 ENCOUNTER — Telehealth: Payer: Self-pay | Admitting: Family Medicine

## 2016-06-24 NOTE — Telephone Encounter (Signed)
appt made

## 2016-06-25 ENCOUNTER — Encounter: Payer: Self-pay | Admitting: Family Medicine

## 2016-06-25 ENCOUNTER — Ambulatory Visit: Payer: Medicare HMO

## 2016-06-25 ENCOUNTER — Ambulatory Visit (INDEPENDENT_AMBULATORY_CARE_PROVIDER_SITE_OTHER): Payer: Medicare HMO

## 2016-06-25 ENCOUNTER — Ambulatory Visit (INDEPENDENT_AMBULATORY_CARE_PROVIDER_SITE_OTHER): Payer: Medicare HMO | Admitting: Family Medicine

## 2016-06-25 ENCOUNTER — Other Ambulatory Visit: Payer: Self-pay | Admitting: Family Medicine

## 2016-06-25 ENCOUNTER — Other Ambulatory Visit: Payer: Self-pay | Admitting: Orthopedic Surgery

## 2016-06-25 VITALS — BP 140/67 | HR 89 | Temp 97.0°F

## 2016-06-25 DIAGNOSIS — M25572 Pain in left ankle and joints of left foot: Secondary | ICD-10-CM

## 2016-06-25 DIAGNOSIS — M25475 Effusion, left foot: Secondary | ICD-10-CM | POA: Diagnosis not present

## 2016-06-25 DIAGNOSIS — S82409A Unspecified fracture of shaft of unspecified fibula, initial encounter for closed fracture: Secondary | ICD-10-CM

## 2016-06-25 NOTE — Progress Notes (Signed)
BP 140/67 (BP Location: Left Arm, Patient Position: Sitting, Cuff Size: Normal)   Pulse 89   Temp 97 F (36.1 C) (Oral)    Subjective:    Patient ID: Sue Schaefer, female    DOB: 06/13/1920, 82 y.o.   MRN: [id number]  HPI: Sue Schaefer is a 82 y.o. female presenting on 06/25/2016 for Pain and swelling in left foot and ankle (daughter reports she has had several episodes recently where the knee has gone out and she may have twisted the ankle/foot) and Discuss vitamins (daughter would like for her to go on one senior multi vitamin and go off of all these )   HPI Left leg pain and swelling Left leg pain and swelling that started a couple of day ago when the patient twisted her knee and fell down a little bit on that left side. Since then she's been having swelling and pain in that left leg in the knee and mid calf and a little bit in the ankle as well. The swelling is minimal but the pain is causing her more issues. Her pain is 6 out of 10 when the worst the right now to 3 out of 10. She has been taking Tylenol to help with this. She has been unable to get up and put pressure on this leg is much as she could before.   Relevant past medical, surgical, family and social history reviewed and updated as indicated. Interim medical history since our last visit reviewed. Allergies and medications reviewed and updated.  Review of Systems  Constitutional: Negative for chills and fever.  HENT: Negative for congestion, ear discharge and ear pain.   Eyes: Negative for redness and visual disturbance.  Respiratory: Negative for chest tightness and shortness of breath.   Cardiovascular: Positive for leg swelling. Negative for chest pain.  Genitourinary: Negative for difficulty urinating and dysuria.  Musculoskeletal: Positive for arthralgias. Negative for back pain and gait problem.  Skin: Negative for rash.  Neurological: Negative for light-headedness and headaches.  Psychiatric/Behavioral: Negative  for agitation and behavioral problems.  All other systems reviewed and are negative.   Per HPI unless specifically indicated above     Medication List       Accurate as of 06/25/16 10:08 AM. Always use your most recent med list.          alendronate 70 MG tablet Commonly known as:  FOSAMAX TAKE 1 TABLET ONCE A WEEK   amLODipine 5 MG tablet Commonly known as:  NORVASC Take 1 tablet (5 mg total) by mouth daily.   aspirin 81 MG chewable tablet Chew 81 mg by mouth daily.   atenolol 25 MG tablet Commonly known as:  TENORMIN TAKE 1 TABLET ONCE A DAY   beclomethasone 80 MCG/ACT inhaler Commonly known as:  QVAR Inhale 2 puffs into the lungs daily.   Calcium-Vitamin D 600-200 MG-UNIT tablet Take 1 tablet by mouth.   citalopram 10 MG tablet Commonly known as:  CELEXA Take 1 tablet (10 mg total) by mouth daily.   docusate sodium 100 MG capsule Commonly known as:  COLACE Take 2 capsules (200 mg total) by mouth daily.   donepezil 10 MG tablet Commonly known as:  ARICEPT Take 1 tablet (10 mg total) by mouth at bedtime as needed.   furosemide 40 MG tablet Commonly known as:  LASIX TAKE 1 TABLET DAILY   isosorbide mononitrate 30 MG 24 hr tablet Commonly known as:  IMDUR Take 1 tablet (30 mg total) by mouth  daily.   Lutein 6 MG Caps Take by mouth daily.   mupirocin ointment 2 % Commonly known as:  BACTROBAN Small amount to AA of nose TID x 14 days   MYRBETRIQ 50 MG Tb24 tablet Generic drug:  mirabegron ER TAKE ONE TABLET AT BEDTIME   NAMENDA XR 28 MG Cp24 24 hr capsule Generic drug:  memantine TAKE (1) CAPSULE DAILY   NIFEDICAL XL 30 MG 24 hr tablet Generic drug:  NIFEdipine Reported on 01/06/2016   nitroGLYCERIN 0.4 MG SL tablet Commonly known as:  NITROSTAT Place 0.4 mg under the tongue every 5 (five) minutes as needed.   Omega-3 1000 MG Caps Take 1 capsule by mouth.   polycarbophil 625 MG tablet Commonly known as:  FIBERCON Take 625 mg by mouth 2  (two) times daily.   potassium chloride SA 20 MEQ tablet Commonly known as:  K-DUR,KLOR-CON TAKE 1 TABLET DAILY   prednisoLONE acetate 1 % ophthalmic suspension Commonly known as:  PRED FORTE Place 1 drop into the right eye 2 (two) times daily.   RA TURMERIC 500 MG Caps Generic drug:  Turmeric Take 1 capsule by mouth.   SYSTANE 0.4-0.3 % Soln Generic drug:  Polyethyl Glycol-Propyl Glycol Apply 1 drop to eye 4 (four) times daily.   Transport Chair Misc 1 each by Does not apply route daily.          Objective:    BP 140/67 (BP Location: Left Arm, Patient Position: Sitting, Cuff Size: Normal)   Pulse 89   Temp 97 F (36.1 C) (Oral)   Wt Readings from Last 3 Encounters:  01/06/16 119 lb (54 kg)  08/25/15 126 lb 3.2 oz (57.2 kg)  04/14/15 132 lb 12.8 oz (60.2 kg)    Physical Exam  Constitutional: She is oriented to person, place, and time. She appears well-developed and well-nourished. No distress.  Eyes: Conjunctivae and EOM are normal. Pupils are equal, round, and reactive to light.  Cardiovascular: Normal rate, regular rhythm, normal heart sounds and intact distal pulses.   No murmur heard. Pulmonary/Chest: Effort normal and breath sounds normal. No respiratory distress. She has no wheezes.  Musculoskeletal: Normal range of motion.       Left lower leg: She exhibits tenderness (Mid calf tenderness and tenderness over the right medial knee), swelling and edema (Nonpitting trace). She exhibits no deformity and no laceration.  Neurological: She is alert and oriented to person, place, and time. Coordination normal.  Skin: Skin is warm and dry. No rash noted. She is not diaphoretic.  Psychiatric: She has a normal mood and affect. Her behavior is normal.  Nursing note and vitals reviewed.  L ankle Xr: no acute bony abnormality L Tib-fib XR: No acute bony abnormality noted await final read per radiology    Assessment & Plan:   Problem List Items Addressed This Visit     None    Visit Diagnoses    Pain in joint, ankle and foot, left    -  Primary   Relevant Orders   DG Foot Complete Left (Completed)   Swelling of foot joint, left       Relevant Orders   DG Foot Complete Left (Completed)       Follow up plan: Return if symptoms worsen or fail to improve.  Counseling provided for all of the vaccine components Orders Placed This Encounter  Procedures  . DG Foot Complete Left  . DG Ankle Complete Left    Caryl Pina, MD Sue Schaefer  Family Medicine 06/25/2016, 10:08 AM

## 2016-06-28 ENCOUNTER — Telehealth: Payer: Self-pay | Admitting: Family Medicine

## 2016-06-29 NOTE — Telephone Encounter (Signed)
Sent last office visit to [employer]

## 2016-09-16 ENCOUNTER — Telehealth: Payer: Self-pay | Admitting: *Deleted

## 2016-09-16 NOTE — Telephone Encounter (Signed)
Please send order to [facility] to d/c pt's Vit A, Vit E and Lipogen plus

## 2016-09-17 NOTE — Telephone Encounter (Signed)
Talked to [facility], she will send over order and we will sighn

## 2016-10-13 ENCOUNTER — Ambulatory Visit: Payer: Medicare HMO | Admitting: Family Medicine

## 2016-10-15 ENCOUNTER — Ambulatory Visit (INDEPENDENT_AMBULATORY_CARE_PROVIDER_SITE_OTHER): Payer: Medicare HMO | Admitting: Family Medicine

## 2016-10-15 ENCOUNTER — Encounter: Payer: Self-pay | Admitting: Family Medicine

## 2016-10-15 VITALS — BP 133/80 | HR 77 | Temp 97.1°F | Wt 139.2 lb

## 2016-10-15 DIAGNOSIS — M62838 Other muscle spasm: Secondary | ICD-10-CM

## 2016-10-15 DIAGNOSIS — S76911A Strain of unspecified muscles, fascia and tendons at thigh level, right thigh, initial encounter: Secondary | ICD-10-CM

## 2016-10-15 MED ORDER — TIZANIDINE HCL 2 MG PO CAPS: 2.0000 mg | ORAL_CAPSULE | Freq: Every evening | ORAL | 1 refills | Status: DC | PRN

## 2016-10-15 NOTE — Progress Notes (Signed)
BP 133/80   Pulse 77   Temp 97.1 F (36.2 C) (Oral)   Wt 139 lb 4 oz (63.2 kg)   BMI 27.20 kg/m    Subjective:    Patient ID: Sue Schaefer, female    DOB: 03-05-1920, 82 y.o.   MRN: [id number]  HPI: Sue Schaefer is a 82 y.o. female presenting on 10/15/2016 for right hip and knee pain and Left shoulder pain   HPI Right leg pain and left shoulder pain Patient has had increasing right leg pain and left shoulder pain that has been going on for the past few months. She has had a couple falls before that time and she has been less active over the past few months but she has been complaining of it even more over the past week. Her daughter is here with her today and denies her having any fevers or chills or skin changes or redness or warmth. Patient does have known significant arthritis and is something that she fights. The pain in her left shoulder specifically on the musculature between her shoulder and neck. The pain on her right leg is on the musculature anteriorly between her hip and her knee.  Relevant past medical, surgical, family and social history reviewed and updated as indicated. Interim medical history since our last visit reviewed. Allergies and medications reviewed and updated.  Review of Systems  Constitutional: Negative for chills and fever.  HENT: Negative for congestion, ear discharge and ear pain.   Respiratory: Negative for chest tightness and shortness of breath.   Cardiovascular: Negative for chest pain and leg swelling.  Genitourinary: Negative for difficulty urinating and dysuria.  Musculoskeletal: Positive for arthralgias, gait problem and myalgias. Negative for back pain, joint swelling, neck pain and neck stiffness.  Skin: Negative for rash.  Neurological: Negative for light-headedness and headaches.  Psychiatric/Behavioral: Negative for agitation and behavioral problems.  All other systems reviewed and are negative.   Per HPI unless specifically indicated  above      Objective:    BP 133/80   Pulse 77   Temp 97.1 F (36.2 C) (Oral)   Wt 139 lb 4 oz (63.2 kg)   BMI 27.20 kg/m   Wt Readings from Last 3 Encounters:  10/15/16 139 lb 4 oz (63.2 kg)  01/06/16 119 lb (54 kg)  08/25/15 126 lb 3.2 oz (57.2 kg)    Physical Exam  Constitutional: She is oriented to person, place, and time. She appears well-developed and well-nourished. No distress.  Eyes: Conjunctivae are normal.  Musculoskeletal: She exhibits no edema.       Left shoulder: She exhibits decreased range of motion (Known arthritis and decreased range of motion from it, not new), pain and spasm. She exhibits no tenderness, no bony tenderness, no swelling and no deformity.       Cervical back: She exhibits tenderness.       Back:       Right upper leg: She exhibits tenderness. She exhibits no bony tenderness, no swelling, no edema and no deformity.       Legs: Neurological: She is alert and oriented to person, place, and time. Coordination normal.  Skin: Skin is warm and dry. No rash noted. She is not diaphoretic.  Psychiatric: She has a normal mood and affect. Her behavior is normal.  Nursing note and vitals reviewed.     Assessment & Plan:   Problem List Items Addressed This Visit    None    Visit Diagnoses  Muscle spasm of left shoulder    -  Primary   Recommended muscle relaxer and PT   Relevant Medications   tizanidine (ZANAFLEX) 2 MG capsule   Muscle strain of right thigh, initial encounter       Relevant Medications   tizanidine (ZANAFLEX) 2 MG capsule       Follow up plan: Return if symptoms worsen or fail to improve.  Counseling provided for all of the vaccine components No orders of the defined types were placed in this encounter.   Caryl Pina, MD [facility] 10/15/2016, 11:52 AM

## 2016-10-22 DIAGNOSIS — R296 Repeated falls: Secondary | ICD-10-CM | POA: Diagnosis not present

## 2016-10-22 DIAGNOSIS — M62838 Other muscle spasm: Secondary | ICD-10-CM | POA: Diagnosis not present

## 2016-10-22 DIAGNOSIS — Z7982 Long term (current) use of aspirin: Secondary | ICD-10-CM | POA: Diagnosis not present

## 2016-10-22 DIAGNOSIS — M810 Age-related osteoporosis without current pathological fracture: Secondary | ICD-10-CM | POA: Diagnosis not present

## 2016-10-22 DIAGNOSIS — R252 Cramp and spasm: Secondary | ICD-10-CM | POA: Diagnosis not present

## 2016-10-22 DIAGNOSIS — M25511 Pain in right shoulder: Secondary | ICD-10-CM | POA: Diagnosis not present

## 2016-10-22 DIAGNOSIS — M79661 Pain in right lower leg: Secondary | ICD-10-CM | POA: Diagnosis not present

## 2016-10-27 DIAGNOSIS — M25511 Pain in right shoulder: Secondary | ICD-10-CM | POA: Diagnosis not present

## 2016-10-27 DIAGNOSIS — Z7982 Long term (current) use of aspirin: Secondary | ICD-10-CM | POA: Diagnosis not present

## 2016-10-27 DIAGNOSIS — M79661 Pain in right lower leg: Secondary | ICD-10-CM | POA: Diagnosis not present

## 2016-10-27 DIAGNOSIS — R296 Repeated falls: Secondary | ICD-10-CM | POA: Diagnosis not present

## 2016-10-27 DIAGNOSIS — M62838 Other muscle spasm: Secondary | ICD-10-CM | POA: Diagnosis not present

## 2016-10-27 DIAGNOSIS — R252 Cramp and spasm: Secondary | ICD-10-CM | POA: Diagnosis not present

## 2016-10-27 DIAGNOSIS — M810 Age-related osteoporosis without current pathological fracture: Secondary | ICD-10-CM | POA: Diagnosis not present

## 2016-10-28 ENCOUNTER — Telehealth: Payer: Self-pay | Admitting: Family Medicine

## 2016-10-28 MED ORDER — GUAIFENESIN ER 600 MG PO TB12: 600.0000 mg | ORAL_TABLET | Freq: Two times a day (BID) | ORAL | 1 refills | Status: DC

## 2016-10-28 MED ORDER — DOXYCYCLINE HYCLATE 100 MG PO TABS: 100.0000 mg | ORAL_TABLET | Freq: Two times a day (BID) | ORAL | 0 refills | Status: DC

## 2016-10-28 NOTE — Telephone Encounter (Signed)
Pt's daughter notified of recommendation and RXs Verbalizes understanding

## 2016-10-28 NOTE — Telephone Encounter (Signed)
Pt has some head and chest congestion She has prod cough (green) Pt's daughter is in hospital and pt can not come in Wants something for cough (Mucinex) and antibiotic Please review and advise

## 2016-10-29 ENCOUNTER — Other Ambulatory Visit: Payer: Self-pay | Admitting: Family Medicine

## 2016-10-29 ENCOUNTER — Telehealth: Payer: Self-pay | Admitting: *Deleted

## 2016-10-29 DIAGNOSIS — R252 Cramp and spasm: Secondary | ICD-10-CM | POA: Diagnosis not present

## 2016-10-29 DIAGNOSIS — M810 Age-related osteoporosis without current pathological fracture: Secondary | ICD-10-CM | POA: Diagnosis not present

## 2016-10-29 DIAGNOSIS — M25511 Pain in right shoulder: Secondary | ICD-10-CM | POA: Diagnosis not present

## 2016-10-29 DIAGNOSIS — M79661 Pain in right lower leg: Secondary | ICD-10-CM | POA: Diagnosis not present

## 2016-10-29 DIAGNOSIS — M62838 Other muscle spasm: Secondary | ICD-10-CM | POA: Diagnosis not present

## 2016-10-29 DIAGNOSIS — R296 Repeated falls: Secondary | ICD-10-CM | POA: Diagnosis not present

## 2016-10-29 DIAGNOSIS — Z7982 Long term (current) use of aspirin: Secondary | ICD-10-CM | POA: Diagnosis not present

## 2016-10-29 MED ORDER — DOCUSATE SODIUM 100 MG PO CAPS: 100.0000 mg | ORAL_CAPSULE | Freq: Two times a day (BID) | ORAL | 5 refills | Status: DC

## 2016-10-29 NOTE — Telephone Encounter (Signed)
Her stool is so hard it is causing some rectal bleeding can she have a stool softener. Please order and have nurse call them back at [place] [facility]. [phone]. Route to nurse

## 2016-10-29 NOTE — Telephone Encounter (Signed)
[place] and gave order to [place].

## 2016-10-29 NOTE — Telephone Encounter (Signed)
Please contact the patient use colace 100 mg BID

## 2016-11-02 DIAGNOSIS — M810 Age-related osteoporosis without current pathological fracture: Secondary | ICD-10-CM | POA: Diagnosis not present

## 2016-11-02 DIAGNOSIS — Z7982 Long term (current) use of aspirin: Secondary | ICD-10-CM | POA: Diagnosis not present

## 2016-11-02 DIAGNOSIS — M25511 Pain in right shoulder: Secondary | ICD-10-CM | POA: Diagnosis not present

## 2016-11-02 DIAGNOSIS — R252 Cramp and spasm: Secondary | ICD-10-CM | POA: Diagnosis not present

## 2016-11-02 DIAGNOSIS — M79661 Pain in right lower leg: Secondary | ICD-10-CM | POA: Diagnosis not present

## 2016-11-02 DIAGNOSIS — M62838 Other muscle spasm: Secondary | ICD-10-CM | POA: Diagnosis not present

## 2016-11-02 DIAGNOSIS — R296 Repeated falls: Secondary | ICD-10-CM | POA: Diagnosis not present

## 2016-11-04 DIAGNOSIS — M62838 Other muscle spasm: Secondary | ICD-10-CM | POA: Diagnosis not present

## 2016-11-04 DIAGNOSIS — M25511 Pain in right shoulder: Secondary | ICD-10-CM | POA: Diagnosis not present

## 2016-11-04 DIAGNOSIS — Z7982 Long term (current) use of aspirin: Secondary | ICD-10-CM | POA: Diagnosis not present

## 2016-11-04 DIAGNOSIS — R252 Cramp and spasm: Secondary | ICD-10-CM | POA: Diagnosis not present

## 2016-11-04 DIAGNOSIS — R296 Repeated falls: Secondary | ICD-10-CM | POA: Diagnosis not present

## 2016-11-04 DIAGNOSIS — M79661 Pain in right lower leg: Secondary | ICD-10-CM | POA: Diagnosis not present

## 2016-11-04 DIAGNOSIS — M810 Age-related osteoporosis without current pathological fracture: Secondary | ICD-10-CM | POA: Diagnosis not present

## 2016-11-10 DIAGNOSIS — M810 Age-related osteoporosis without current pathological fracture: Secondary | ICD-10-CM | POA: Diagnosis not present

## 2016-11-10 DIAGNOSIS — R296 Repeated falls: Secondary | ICD-10-CM | POA: Diagnosis not present

## 2016-11-10 DIAGNOSIS — M25511 Pain in right shoulder: Secondary | ICD-10-CM | POA: Diagnosis not present

## 2016-11-10 DIAGNOSIS — M79661 Pain in right lower leg: Secondary | ICD-10-CM | POA: Diagnosis not present

## 2016-11-10 DIAGNOSIS — R252 Cramp and spasm: Secondary | ICD-10-CM | POA: Diagnosis not present

## 2016-11-10 DIAGNOSIS — Z7982 Long term (current) use of aspirin: Secondary | ICD-10-CM | POA: Diagnosis not present

## 2016-11-10 DIAGNOSIS — M62838 Other muscle spasm: Secondary | ICD-10-CM | POA: Diagnosis not present

## 2016-12-08 ENCOUNTER — Ambulatory Visit (INDEPENDENT_AMBULATORY_CARE_PROVIDER_SITE_OTHER): Payer: Medicare HMO | Admitting: Family Medicine

## 2016-12-08 DIAGNOSIS — M62838 Other muscle spasm: Secondary | ICD-10-CM | POA: Diagnosis not present

## 2016-12-08 DIAGNOSIS — R296 Repeated falls: Secondary | ICD-10-CM

## 2016-12-08 DIAGNOSIS — M25511 Pain in right shoulder: Secondary | ICD-10-CM

## 2016-12-08 DIAGNOSIS — M79661 Pain in right lower leg: Secondary | ICD-10-CM | POA: Diagnosis not present

## 2017-02-15 ENCOUNTER — Telehealth: Payer: Self-pay | Admitting: Family Medicine

## 2017-02-15 ENCOUNTER — Telehealth: Payer: Self-pay

## 2017-02-15 NOTE — Telephone Encounter (Signed)
Gave to Debbi, she is working on

## 2017-02-15 NOTE — Telephone Encounter (Signed)
Go ahead and send breo ellipta 100 and give 6 months worth

## 2017-02-16 ENCOUNTER — Telehealth: Payer: Self-pay | Admitting: Family Medicine

## 2017-02-16 MED ORDER — FLUTICASONE FUROATE-VILANTEROL 100-25 MCG/INH IN AEPB: 1.0000 | INHALATION_SPRAY | Freq: Every day | RESPIRATORY_TRACT | 4 refills | Status: DC

## 2017-02-16 NOTE — Telephone Encounter (Signed)
Going to fax the order

## 2017-02-16 NOTE — Telephone Encounter (Signed)
Rx sent 

## 2017-02-16 NOTE — Addendum Note (Signed)
Addended by: Karle Plumber on: 02/16/2017 08:14 AM   Modules accepted: Orders

## 2017-02-23 ENCOUNTER — Other Ambulatory Visit: Payer: Self-pay | Admitting: Family Medicine

## 2017-02-23 MED ORDER — FLUTICASONE FUROATE-VILANTEROL 200-25 MCG/INH IN AEPB
1.0000 | INHALATION_SPRAY | Freq: Every day | RESPIRATORY_TRACT | 11 refills | Status: AC
Start: 2017-02-23 — End: ?

## 2017-05-16 ENCOUNTER — Encounter: Payer: Self-pay | Admitting: Family Medicine

## 2017-05-16 ENCOUNTER — Ambulatory Visit (INDEPENDENT_AMBULATORY_CARE_PROVIDER_SITE_OTHER): Payer: Medicare HMO | Admitting: Family Medicine

## 2017-05-16 VITALS — BP 110/56 | HR 77 | Temp 98.2°F | Ht 60.0 in | Wt 147.0 lb

## 2017-05-16 DIAGNOSIS — I10 Essential (primary) hypertension: Secondary | ICD-10-CM

## 2017-05-16 DIAGNOSIS — E785 Hyperlipidemia, unspecified: Secondary | ICD-10-CM | POA: Diagnosis not present

## 2017-05-16 DIAGNOSIS — R197 Diarrhea, unspecified: Secondary | ICD-10-CM | POA: Diagnosis not present

## 2017-05-16 DIAGNOSIS — R300 Dysuria: Secondary | ICD-10-CM

## 2017-05-16 NOTE — Progress Notes (Signed)
BP (!) 110/56   Pulse 77   Temp 98.2 F (36.8 C) (Oral)   Ht 5' (1.524 m)   Wt 147 lb (66.7 kg)   BMI 28.71 kg/m    Subjective:    Patient ID: Sue Schaefer, female    DOB: May 26, 1920, 83 y.o.   MRN: [id number]  HPI: Sue Schaefer is a 83 y.o. female presenting on 05/16/2017 for Medication Management   HPI Diarrhea, recurrent Patient is coming in with recurrent diarrhea and loose stools is been happening over the past few months. She says that since she has been at the nursing home per her daughter who is giving most the history because the patient has dementia that they have been giving her the stool softener twice a day every day instead of just as needed like she was using at home. She thinks this may be one of the major issues as to why she is having a lot of looser stools on top of taking her fiber every day. She was coming in today to see if she could have some these medications adjusted. She denies any blood in her stool that they know of but has rheumatoid and soiled her couple of pairs close because of her diarrhea. She denies any abdominal pain or nausea or vomiting.  Hypertension hyperlipidemia and lab check Patient is coming in for lab recheck for her hyperlipidemia and cholesterol. They deny any issues with her current medications. She has been doing well and her blood pressure today is 110/56. They would like to have her labs checked as well.  Recurrent UTIs Patient is coming in today with complaints of recurrent UTIs, she denies any current symptoms but wants to have a urine tested to see if it is cleared per her daughter.  Relevant past medical, surgical, family and social history reviewed and updated as indicated. Interim medical history since our last visit reviewed. Allergies and medications reviewed and updated.  Review of Systems  Constitutional: Negative for chills and fever.  Respiratory: Negative for chest tightness and shortness of breath.   Cardiovascular:  Negative for chest pain and leg swelling.  Gastrointestinal: Positive for diarrhea. Negative for abdominal pain, constipation, nausea and vomiting.  Genitourinary: Negative for difficulty urinating and dysuria.  Musculoskeletal: Negative for back pain and gait problem.  Skin: Negative for rash.  Neurological: Negative for dizziness, light-headedness and headaches.  Psychiatric/Behavioral: Negative for agitation, behavioral problems and hallucinations.  All other systems reviewed and are negative.   Per HPI unless specifically indicated above        Objective:    BP (!) 110/56   Pulse 77   Temp 98.2 F (36.8 C) (Oral)   Ht 5' (1.524 m)   Wt 147 lb (66.7 kg)   BMI 28.71 kg/m   Wt Readings from Last 3 Encounters:  05/16/17 147 lb (66.7 kg)  10/15/16 139 lb 4 oz (63.2 kg)  01/06/16 119 lb (54 kg)    Physical Exam  Constitutional: She is oriented to person, place, and time. She appears well-developed and well-nourished. No distress.  Eyes: Conjunctivae are normal.  Neck: Neck supple. No thyromegaly present.  Cardiovascular: Normal rate, regular rhythm, normal heart sounds and intact distal pulses.   No murmur heard. Pulmonary/Chest: Effort normal and breath sounds normal. No respiratory distress. She has no wheezes.  Abdominal: Soft. Bowel sounds are normal. She exhibits no distension. There is no tenderness. There is no rigidity, no rebound, no guarding and no CVA tenderness.  Musculoskeletal:  Normal range of motion.  Lymphadenopathy:    She has no cervical adenopathy.  Neurological: She is alert and oriented to person, place, and time. Coordination normal.  Skin: Skin is warm and dry. No rash noted. She is not diaphoretic.  Psychiatric: She has a normal mood and affect. Her behavior is normal.  Nursing note and vitals reviewed.       Assessment & Plan:   Problem List Items Addressed This Visit      Cardiovascular and Mediastinum   Essential hypertension   Relevant  Orders   CMP14+EGFR     Other   Hyperlipidemia LDL goal <130   Relevant Orders   Lipid panel    Other Visit Diagnoses    Diarrhea, unspecified type    -  Primary   Relevant Orders   CMP14+EGFR   CBC with Differential/Platelet   Dysuria       Relevant Orders   Urinalysis, Complete       Follow up plan: Return in about 1 year (around 05/16/2018), or if symptoms worsen or fail to improve.  Counseling provided for all of the vaccine components Orders Placed This Encounter  Procedures  . CMP14+EGFR  . Lipid panel  . CBC with Differential/Platelet  . Urinalysis, Complete    Caryl Pina, MD [facility] 05/16/2017, 3:51 PM

## 2017-05-17 LAB — LIPID PANEL
CHOLESTEROL TOTAL: 199 mg/dL (ref 100–199)
Chol/HDL Ratio: 3 ratio (ref 0.0–4.4)
HDL: 66 mg/dL (ref >39)
LDL Calculated: 111 mg/dL — ABNORMAL HIGH (ref 0–99)
Triglycerides: 108 mg/dL (ref 0–149)
VLDL CHOLESTEROL CAL: 22 mg/dL (ref 5–40)

## 2017-05-17 LAB — CBC WITH DIFFERENTIAL/PLATELET
BASOS: 1 %
Basophils Absolute: 0.1 10*3/uL (ref 0.0–0.2)
EOS (ABSOLUTE): 0.6 10*3/uL — ABNORMAL HIGH (ref 0.0–0.4)
EOS: 7 %
HEMATOCRIT: 36.9 % (ref 34.0–46.6)
HEMOGLOBIN: 12 g/dL (ref 11.1–15.9)
IMMATURE GRANULOCYTES: 0 %
Immature Grans (Abs): 0 10*3/uL (ref 0.0–0.1)
Lymphocytes Absolute: 2.1 10*3/uL (ref 0.7–3.1)
Lymphs: 24 %
MCH: 28.2 pg (ref 26.6–33.0)
MCHC: 32.5 g/dL (ref 31.5–35.7)
MCV: 87 fL (ref 79–97)
MONOS ABS: 1.3 10*3/uL — AB (ref 0.1–0.9)
Monocytes: 15 %
NEUTROS PCT: 53 %
Neutrophils Absolute: 4.6 10*3/uL (ref 1.4–7.0)
Platelets: 203 10*3/uL (ref 150–379)
RBC: 4.26 x10E6/uL (ref 3.77–5.28)
RDW: 14.3 % (ref 12.3–15.4)
WBC: 8.6 10*3/uL (ref 3.4–10.8)

## 2017-05-17 LAB — CMP14+EGFR
ALK PHOS: 73 IU/L (ref 39–117)
ALT: 15 IU/L (ref 0–32)
AST: 23 IU/L (ref 0–40)
Albumin/Globulin Ratio: 1.4 (ref 1.2–2.2)
Albumin: 4 g/dL (ref 3.2–4.6)
BUN / CREAT RATIO: 22 (ref 12–28)
BUN: 26 mg/dL (ref 10–36)
CHLORIDE: 102 mmol/L (ref 96–106)
CO2: 21 mmol/L (ref 20–29)
Calcium: 9.3 mg/dL (ref 8.7–10.3)
Creatinine, Ser: 1.16 mg/dL — ABNORMAL HIGH (ref 0.57–1.00)
GFR calc Af Amer: 46 mL/min/{1.73_m2} — ABNORMAL LOW (ref >59)
GFR calc non Af Amer: 40 mL/min/{1.73_m2} — ABNORMAL LOW (ref >59)
GLUCOSE: 95 mg/dL (ref 65–99)
Globulin, Total: 2.9 g/dL (ref 1.5–4.5)
Potassium: 4.7 mmol/L (ref 3.5–5.2)
Sodium: 138 mmol/L (ref 134–144)
Total Protein: 6.9 g/dL (ref 6.0–8.5)

## 2017-05-19 ENCOUNTER — Other Ambulatory Visit: Payer: Medicare HMO

## 2017-05-19 DIAGNOSIS — R300 Dysuria: Secondary | ICD-10-CM | POA: Diagnosis not present

## 2017-05-19 LAB — URINALYSIS, COMPLETE
Bilirubin, UA: NEGATIVE
GLUCOSE, UA: NEGATIVE
KETONES UA: NEGATIVE
NITRITE UA: NEGATIVE
PROTEIN UA: NEGATIVE
RBC, UA: NEGATIVE
SPEC GRAV UA: 1.015 (ref 1.005–1.030)
UUROB: 0.2 mg/dL (ref 0.2–1.0)
pH, UA: 5.5 (ref 5.0–7.5)

## 2017-05-19 LAB — MICROSCOPIC EXAMINATION
RBC MICROSCOPIC, UA: NONE SEEN /HPF (ref 0–?)
Renal Epithel, UA: NONE SEEN /hpf

## 2017-08-08 ENCOUNTER — Other Ambulatory Visit: Payer: Self-pay

## 2017-08-08 ENCOUNTER — Other Ambulatory Visit: Payer: Medicare HMO

## 2017-08-08 ENCOUNTER — Telehealth: Payer: Self-pay | Admitting: Family Medicine

## 2017-08-08 ENCOUNTER — Other Ambulatory Visit: Payer: Self-pay | Admitting: *Deleted

## 2017-08-08 DIAGNOSIS — Z87440 Personal history of urinary (tract) infections: Secondary | ICD-10-CM | POA: Diagnosis not present

## 2017-08-08 DIAGNOSIS — R300 Dysuria: Secondary | ICD-10-CM

## 2017-08-08 LAB — MICROSCOPIC EXAMINATION: WBC, UA: >30 /hpf — AB (ref 0–?)

## 2017-08-08 LAB — URINALYSIS, COMPLETE
BILIRUBIN UA: NEGATIVE
Glucose, UA: NEGATIVE
Nitrite, UA: POSITIVE — AB
Protein, UA: NEGATIVE
RBC, UA: NEGATIVE
SPEC GRAV UA: 1.015 (ref 1.005–1.030)
UUROB: 0.2 mg/dL (ref 0.2–1.0)
pH, UA: 5 (ref 5.0–7.5)

## 2017-08-08 MED ORDER — URINE SPECIMEN COLLECTION CO KIT: PACK | 0 refills | Status: DC

## 2017-08-08 MED ORDER — NITROFURANTOIN MONOHYD MACRO 100 MG PO CAPS: 100.0000 mg | ORAL_CAPSULE | Freq: Two times a day (BID) | ORAL | 0 refills | Status: DC

## 2017-08-08 NOTE — Telephone Encounter (Signed)
Patient had urinalysis which was positive for both nitrite and leukocytes, will run culture and sent Macrobid for her. Caryl Pina, MD [facility] 08/08/2017, 12:52 PM

## 2017-08-10 LAB — URINE CULTURE

## 2017-08-11 ENCOUNTER — Telehealth: Payer: Self-pay | Admitting: Family Medicine

## 2017-08-11 NOTE — Telephone Encounter (Signed)
Erroneous encounter, disregard

## 2017-08-22 ENCOUNTER — Telehealth: Payer: Self-pay | Admitting: Family Medicine

## 2017-08-22 ENCOUNTER — Other Ambulatory Visit: Payer: Medicare HMO

## 2017-08-22 DIAGNOSIS — Z87440 Personal history of urinary (tract) infections: Secondary | ICD-10-CM

## 2017-08-22 LAB — URINALYSIS, COMPLETE
BILIRUBIN UA: NEGATIVE
GLUCOSE, UA: NEGATIVE
KETONES UA: NEGATIVE
Nitrite, UA: POSITIVE — AB
Protein, UA: NEGATIVE
SPEC GRAV UA: 1.015 (ref 1.005–1.030)
Urobilinogen, Ur: 0.2 mg/dL (ref 0.2–1.0)
pH, UA: 5 (ref 5.0–7.5)

## 2017-08-22 LAB — MICROSCOPIC EXAMINATION: WBC, UA: >30 /hpf — AB (ref 0–?)

## 2017-08-22 MED ORDER — DOXYCYCLINE HYCLATE 100 MG PO TABS: 100.0000 mg | ORAL_TABLET | Freq: Two times a day (BID) | ORAL | 0 refills | Status: DC

## 2017-08-22 NOTE — Telephone Encounter (Signed)
Per Janett Billow with NP, pt slipped out of her chair into the floor No obvious injury and pt is not complaining of pain any where NP will call and schedule appt if sxs appear

## 2017-08-22 NOTE — Telephone Encounter (Signed)
Patient still has signs of infection on urinalysis, I sent doxycycline for her Caryl Pina, MD [facility] 08/22/2017, 12:37 PM

## 2017-08-22 NOTE — Telephone Encounter (Signed)
Pt's daughter notified of RX 

## 2017-08-24 ENCOUNTER — Encounter: Payer: Self-pay | Admitting: Family Medicine

## 2017-08-24 ENCOUNTER — Ambulatory Visit (INDEPENDENT_AMBULATORY_CARE_PROVIDER_SITE_OTHER): Payer: Medicare HMO | Admitting: Family Medicine

## 2017-08-24 ENCOUNTER — Ambulatory Visit (INDEPENDENT_AMBULATORY_CARE_PROVIDER_SITE_OTHER): Payer: Medicare HMO

## 2017-08-24 VITALS — BP 113/58 | HR 79 | Temp 97.5°F | Ht 60.0 in | Wt 129.0 lb

## 2017-08-24 DIAGNOSIS — R630 Anorexia: Secondary | ICD-10-CM

## 2017-08-24 DIAGNOSIS — R05 Cough: Secondary | ICD-10-CM | POA: Diagnosis not present

## 2017-08-24 DIAGNOSIS — R4182 Altered mental status, unspecified: Secondary | ICD-10-CM

## 2017-08-24 DIAGNOSIS — K920 Hematemesis: Secondary | ICD-10-CM | POA: Diagnosis not present

## 2017-08-24 DIAGNOSIS — R059 Cough, unspecified: Secondary | ICD-10-CM

## 2017-08-24 LAB — URINE CULTURE

## 2017-08-24 LAB — HEMOGLOBIN, FINGERSTICK: Hemoglobin: 12.3 g/dL (ref 11.1–15.9)

## 2017-08-24 NOTE — Progress Notes (Addendum)
BP (!) 113/58   Pulse 79   Temp (!) 97.5 F (36.4 C) (Oral)   Ht 5' (1.524 m)   Wt 129 lb (58.5 kg)   SpO2 97%   BMI 25.19 kg/m    Subjective:    Patient ID: Sue Schaefer, female    DOB: 03-Dec-1919, 83 y.o.   MRN: [id number]  HPI: Sue Schaefer is a 83 y.o. female presenting on 08/24/2017 for Chest congestion, throwing up phlegm and blood, decreased ap   HPI Cough and chest congestion and hematemesis History is provided by daughter and notes taken from the nursing home.  Patient is coming in complaining of cough and congestion and hematemesis.  She is brought in by her daughter because she has dementia to the point where she cannot answer much of the questions.  She also brings in notes with her daughter from the nursing facility where she stays.  At the nursing facility they have noted that over the past few weeks she has not had much of appetite and just wants to sit and sleep.  She normally reads constantly but has no desire to read over the past couple weeks.  On 08/09/2017 the specimen was taken and she was found to have UTI and Macrobid was started.  On 08/11/2017 the CNA's could not get her the patient to wake up that evening and are breathing was shallow and they aroused her with a pneumonia tablet on 08/20/2017 her mouth suddenly filled up with bright blood and she spit up blood using probably 5 paper towels which were covered with blood rinse out mouth with water blood stopped.  On 08/11/2017 she was eating chicken noodle soup and started gagging and then vomited all of her food and phlegm.  On 08/22/2017 she gave another urine and was still found to have a UTI with.  They say she does not acting normally as well and she is been given liquid supplement nutrition and vitamins that point whether make this appointment to come in and be evaluated for this.  She is still currently taking doxycycline for her urinary tract infection.  Relevant past medical, surgical, family and social history  reviewed and updated as indicated. Interim medical history since our last visit reviewed. Allergies and medications reviewed and updated.  Review of Systems  Constitutional: Positive for activity change, appetite change and fatigue. Negative for chills and fever.  HENT: Negative for congestion, ear discharge and ear pain.   Eyes: Negative for redness and visual disturbance.  Respiratory: Negative for chest tightness and shortness of breath.   Cardiovascular: Negative for chest pain and leg swelling.  Genitourinary: Positive for dysuria. Negative for difficulty urinating.  Musculoskeletal: Negative for back pain and gait problem.  Skin: Negative for rash.  Neurological: Negative for light-headedness and headaches.  Psychiatric/Behavioral: Positive for confusion and decreased concentration. Negative for agitation, behavioral problems, self-injury, sleep disturbance and suicidal ideas.  All other systems reviewed and are negative.   Per HPI unless specifically indicated above      Objective:    BP (!) 113/58   Pulse 79   Temp (!) 97.5 F (36.4 C) (Oral)   Ht 5' (1.524 m)   Wt 129 lb (58.5 kg)   SpO2 97%   BMI 25.19 kg/m   Wt Readings from Last 3 Encounters:  08/24/17 129 lb (58.5 kg)  05/16/17 147 lb (66.7 kg)  10/15/16 139 lb 4 oz (63.2 kg)    Physical Exam  Constitutional: She is oriented to  person, place, and time. She appears well-developed and well-nourished. No distress.  HENT:  Right Ear: External ear normal.  Left Ear: External ear normal.  Nose: Nose normal.  Mouth/Throat: Oropharynx is clear and moist.  Eyes: Conjunctivae are normal.  Neck: Neck supple. No thyromegaly present.  Cardiovascular: Normal rate, regular rhythm, normal heart sounds and intact distal pulses.   No murmur heard. Pulmonary/Chest: Effort normal and breath sounds normal. No respiratory distress. She has no wheezes. She has no rales.  Abdominal: Soft. Bowel sounds are normal. She exhibits no  distension. There is no tenderness. There is no rebound.  Musculoskeletal: Normal range of motion. She exhibits no edema.  Lymphadenopathy:    She has no cervical adenopathy.  Neurological: She is alert and oriented to person, place, and time. Coordination normal.  Skin: Skin is warm and dry. No rash noted. She is not diaphoretic.  Psychiatric: She has a normal mood and affect. Her behavior is normal. Cognition and memory are impaired (Patient has known dementia and is difficult to tell on an office visit but is worse or better).  Nursing note and vitals reviewed.   Point-of-care hemoglobin 12.3    Assessment & Plan:   Problem List Items Addressed This Visit    None    Visit Diagnoses    Cough    -  Primary   Relevant Orders   DG Chest 2 View (Completed)   Hematemesis with nausea       Relevant Orders   Hemoglobin, fingerstick (Completed)   Decreased appetite       Relevant Orders   DG Chest 2 View (Completed)   Altered mental status, unspecified altered mental status type       Relevant Orders   DG Chest 2 View (Completed)      Sounds like is likely related to GERD and possible blood loss and the UTI that she is Artie been diagnosed with.  She just started treatment for the UTI 2 days ago and recommended for her to continue with it.  We will stop NSAIDs and aspirin and monitor for any further blood.  Follow up plan: Return if symptoms worsen or fail to improve.  Counseling provided for all of the vaccine components Orders Placed This Encounter  Procedures  . DG Chest 2 View  . Hemoglobin, fingerstick    Caryl Pina, MD [facility] 08/24/2017, 3:23 PM

## 2017-10-03 ENCOUNTER — Telehealth: Payer: Self-pay | Admitting: Family Medicine

## 2017-10-03 NOTE — Telephone Encounter (Signed)
Spoke to [place] at [facility] and she states the pt had a fall earlier today but no apparent bruising or injuries noted and pt isn't complaining of any pain at this time.

## 2017-10-10 ENCOUNTER — Telehealth: Payer: Self-pay

## 2017-10-10 ENCOUNTER — Other Ambulatory Visit (INDEPENDENT_AMBULATORY_CARE_PROVIDER_SITE_OTHER): Payer: Medicare HMO

## 2017-10-10 DIAGNOSIS — N3000 Acute cystitis without hematuria: Secondary | ICD-10-CM

## 2017-10-10 DIAGNOSIS — Z87440 Personal history of urinary (tract) infections: Secondary | ICD-10-CM | POA: Diagnosis not present

## 2017-10-10 LAB — URINALYSIS, COMPLETE
Bilirubin, UA: NEGATIVE
Glucose, UA: NEGATIVE
KETONES UA: NEGATIVE
NITRITE UA: POSITIVE — AB
PH UA: 5.5 (ref 5.0–7.5)
Protein, UA: NEGATIVE
Specific Gravity, UA: 1.01 (ref 1.005–1.030)
Urobilinogen, Ur: 0.2 mg/dL (ref 0.2–1.0)

## 2017-10-10 LAB — MICROSCOPIC EXAMINATION: WBC, UA: >30 /hpf — AB (ref 0–?)

## 2017-10-10 MED ORDER — DOXYCYCLINE HYCLATE 100 MG PO TABS: 100.0000 mg | ORAL_TABLET | Freq: Two times a day (BID) | ORAL | 0 refills | Status: DC

## 2017-10-10 NOTE — Telephone Encounter (Signed)
Daughter has called and would like to know if you will go ahead and call in an antibiotic for her mother for a UTI until the culture results come back.  She said she has been a little confused and thinks this could be coming from UTI.  Would like this sent to [facility].  Please advise.

## 2017-10-10 NOTE — Progress Notes (Signed)
Patient does show signs of UTI, I have sent in doxycycline for her

## 2017-10-10 NOTE — Addendum Note (Signed)
Addended by: Caryl Pina on: 10/10/2017 02:02 PM   Modules accepted: Orders, Level of Service

## 2017-10-10 NOTE — Addendum Note (Signed)
Addended by: Michaela Corner on: 10/10/2017 02:19 PM   Modules accepted: Orders

## 2017-10-11 NOTE — Telephone Encounter (Signed)
Pt's daughter notified of RX Verbalizes understanding

## 2017-10-12 ENCOUNTER — Ambulatory Visit (INDEPENDENT_AMBULATORY_CARE_PROVIDER_SITE_OTHER): Payer: Medicare HMO

## 2017-10-12 ENCOUNTER — Encounter: Payer: Self-pay | Admitting: Family Medicine

## 2017-10-12 ENCOUNTER — Ambulatory Visit (INDEPENDENT_AMBULATORY_CARE_PROVIDER_SITE_OTHER): Payer: Medicare HMO | Admitting: Family Medicine

## 2017-10-12 VITALS — BP 103/56 | HR 75 | Temp 97.2°F | Ht 60.0 in | Wt 118.6 lb

## 2017-10-12 DIAGNOSIS — M06331 Rheumatoid nodule, right wrist: Secondary | ICD-10-CM

## 2017-10-12 DIAGNOSIS — M25532 Pain in left wrist: Secondary | ICD-10-CM

## 2017-10-12 DIAGNOSIS — M25432 Effusion, left wrist: Secondary | ICD-10-CM

## 2017-10-12 DIAGNOSIS — M25531 Pain in right wrist: Secondary | ICD-10-CM | POA: Diagnosis not present

## 2017-10-12 DIAGNOSIS — S63211A Subluxation of metacarpophalangeal joint of left index finger, initial encounter: Secondary | ICD-10-CM | POA: Diagnosis not present

## 2017-10-12 DIAGNOSIS — S6991XA Unspecified injury of right wrist, hand and finger(s), initial encounter: Secondary | ICD-10-CM | POA: Diagnosis not present

## 2017-10-12 LAB — URINE CULTURE

## 2017-10-12 NOTE — Progress Notes (Signed)
BP (!) 103/56   Pulse 75   Temp (!) 97.2 F (36.2 C) (Oral)   Ht 5' (1.524 m)   Wt 118 lb 9.6 oz (53.8 kg)   BMI 23.16 kg/m    Subjective:    Patient ID: Sue Schaefer, female    DOB: 1920/04/01, 83 y.o.   MRN: [id number]  HPI: Sue Schaefer is a 83 y.o. female presenting on 10/12/2017 for Hand Injury (left hand, fell 5 times in last week, also small know on right hand too)   HPI Left hand swelling and pain especially around the wrist Patient comes in today with her daughter who is giving most of the history because she has dementia who says that she has had falls over the last week about 5 times and has fallen especially on that left hand and wrist redness swelling and lump on the left hand and wrist but they are concerned about and wanted to get checked out.  She does not think she fell on her right hand or wrist but she has a nodule on the posterior aspect of her right wrist that she is also concerned about and wants to get checked out.  She says is been there for couple years but has recently increased in size and she was concerned about it.  She denies any redness or warmth or fevers or chills.  There is some pain with range of motion of the left wrist but none of the right wrist.  She does have bruising along the middle finger and thumb of the left hand as well but none on the right hand.  Relevant past medical, surgical, family and social history reviewed and updated as indicated. Interim medical history since our last visit reviewed. Allergies and medications reviewed and updated.  Review of Systems  Constitutional: Negative for chills and fever.  Eyes: Negative for redness and visual disturbance.  Respiratory: Negative for chest tightness and shortness of breath.   Cardiovascular: Negative for chest pain and leg swelling.  Musculoskeletal: Positive for arthralgias and joint swelling. Negative for back pain and gait problem.  Skin: Negative for color change and rash.    Neurological: Negative for light-headedness and headaches.  Psychiatric/Behavioral: Negative for agitation and behavioral problems.  All other systems reviewed and are negative.   Per HPI unless specifically indicated above     Objective:    BP (!) 103/56   Pulse 75   Temp (!) 97.2 F (36.2 C) (Oral)   Ht 5' (1.524 m)   Wt 118 lb 9.6 oz (53.8 kg)   BMI 23.16 kg/m   Wt Readings from Last 3 Encounters:  10/12/17 118 lb 9.6 oz (53.8 kg)  08/24/17 129 lb (58.5 kg)  05/16/17 147 lb (66.7 kg)    Physical Exam  Constitutional: She is oriented to person, place, and time. She appears well-developed and well-nourished. No distress.  Eyes: Conjunctivae are normal.  Musculoskeletal: She exhibits no edema.       Right wrist: She exhibits deformity (Small firm nodule in the posterior aspect of the right wrist about the size of a pencil eraser that is non-mobile and firm and nontender). She exhibits normal range of motion, no tenderness and no bony tenderness.       Left wrist: She exhibits decreased range of motion, tenderness, swelling and deformity (Swelling and possible deformity upon the posterior aspect of the left wrist and also swelling anteriorly on the left wrist as well).  Neurological: She is alert and oriented  to person, place, and time. Coordination normal.  Skin: Skin is warm and dry. No rash noted. She is not diaphoretic.  Psychiatric: She has a normal mood and affect. Her behavior is normal.  Nursing note and vitals reviewed.   Left hand x-ray: Extensive degenerative changes, await final read from radiologist Right wrist x-ray: Extensive degenerative changes, await final read from radiologist    Assessment & Plan:   Problem List Items Addressed This Visit    None    Visit Diagnoses    Pain and swelling of left wrist    -  Primary   Relevant Orders   DG Hand Complete Left   DG Wrist Complete Right   Rheumatoid nodule of right wrist ([place])           Follow up  plan: Return if symptoms worsen or fail to improve.  Counseling provided for all of the vaccine components Orders Placed This Encounter  Procedures  . DG Hand Complete Left  . DG Wrist Complete Right    Caryl Pina, MD [facility] 10/12/2017, 4:02 PM

## 2017-10-19 ENCOUNTER — Telehealth: Payer: Self-pay | Admitting: Family Medicine

## 2017-10-19 NOTE — Telephone Encounter (Signed)
Spoke with Marzetta Board from [facility]. Wanted to let [place] know that patient had a fall this morning at 9:30am. States she was transferring from recliner to her wheelchair and slide down on her bottom to the floor. No injury just wanted to let Dr know.

## 2017-10-24 ENCOUNTER — Other Ambulatory Visit: Payer: Self-pay | Admitting: Family Medicine

## 2017-10-24 ENCOUNTER — Other Ambulatory Visit: Payer: Medicare HMO

## 2017-10-24 DIAGNOSIS — R300 Dysuria: Secondary | ICD-10-CM

## 2017-10-24 LAB — MICROSCOPIC EXAMINATION: Renal Epithel, UA: NONE SEEN /hpf

## 2017-10-24 LAB — URINALYSIS, COMPLETE
Bilirubin, UA: NEGATIVE
GLUCOSE, UA: NEGATIVE
KETONES UA: NEGATIVE
Nitrite, UA: NEGATIVE
PROTEIN UA: NEGATIVE
RBC, UA: NEGATIVE
SPEC GRAV UA: 1.01 (ref 1.005–1.030)
Urobilinogen, Ur: 0.2 mg/dL (ref 0.2–1.0)
pH, UA: 5.5 (ref 5.0–7.5)

## 2017-10-26 LAB — URINE CULTURE

## 2017-10-28 ENCOUNTER — Other Ambulatory Visit: Payer: Self-pay | Admitting: Family Medicine

## 2017-10-28 DIAGNOSIS — M62838 Other muscle spasm: Secondary | ICD-10-CM

## 2017-10-28 DIAGNOSIS — S76911A Strain of unspecified muscles, fascia and tendons at thigh level, right thigh, initial encounter: Secondary | ICD-10-CM

## 2017-11-03 ENCOUNTER — Other Ambulatory Visit: Payer: Self-pay | Admitting: Family Medicine

## 2017-11-03 DIAGNOSIS — M25532 Pain in left wrist: Secondary | ICD-10-CM

## 2017-11-03 DIAGNOSIS — M25432 Effusion, left wrist: Secondary | ICD-10-CM

## 2017-11-04 ENCOUNTER — Other Ambulatory Visit: Payer: Self-pay | Admitting: Family Medicine

## 2017-11-04 DIAGNOSIS — M25432 Effusion, left wrist: Secondary | ICD-10-CM

## 2017-11-04 DIAGNOSIS — M25532 Pain in left wrist: Secondary | ICD-10-CM

## 2017-11-17 ENCOUNTER — Encounter: Payer: Self-pay | Admitting: Family Medicine

## 2017-11-17 ENCOUNTER — Ambulatory Visit (INDEPENDENT_AMBULATORY_CARE_PROVIDER_SITE_OTHER): Payer: Medicare HMO | Admitting: Family Medicine

## 2017-11-17 VITALS — BP 108/58 | HR 84 | Temp 98.0°F | Ht 60.0 in | Wt 126.0 lb

## 2017-11-17 DIAGNOSIS — N390 Urinary tract infection, site not specified: Secondary | ICD-10-CM | POA: Diagnosis not present

## 2017-11-17 DIAGNOSIS — N3000 Acute cystitis without hematuria: Secondary | ICD-10-CM

## 2017-11-17 DIAGNOSIS — L659 Nonscarring hair loss, unspecified: Secondary | ICD-10-CM | POA: Diagnosis not present

## 2017-11-17 DIAGNOSIS — Z23 Encounter for immunization: Secondary | ICD-10-CM

## 2017-11-17 LAB — MICROSCOPIC EXAMINATION

## 2017-11-17 LAB — URINALYSIS, COMPLETE
Bilirubin, UA: NEGATIVE
GLUCOSE, UA: NEGATIVE
Nitrite, UA: NEGATIVE
RBC, UA: NEGATIVE
SPEC GRAV UA: 1.02 (ref 1.005–1.030)
Urobilinogen, Ur: 0.2 mg/dL (ref 0.2–1.0)
pH, UA: 6 (ref 5.0–7.5)

## 2017-11-17 MED ORDER — DOXYCYCLINE HYCLATE 100 MG PO TABS: 100.0000 mg | ORAL_TABLET | Freq: Two times a day (BID) | ORAL | 0 refills | Status: DC

## 2017-11-17 MED ORDER — NITROFURANTOIN MONOHYD MACRO 100 MG PO CAPS
100.0000 mg | ORAL_CAPSULE | Freq: Every day | ORAL | 2 refills | Status: DC
Start: 2017-11-17 — End: 2018-03-07

## 2017-11-17 NOTE — Progress Notes (Signed)
BP (!) 108/58   Pulse 84   Temp 98 F (36.7 C) (Oral)   Ht 5' (1.524 m)   Wt 126 lb (57.2 kg)   BMI 24.61 kg/m    Subjective:    Patient ID: Sue Schaefer, female    DOB: 17-Feb-1920, 84 y.o.   MRN: [id number]  HPI: Sue Schaefer is a 84 y.o. female presenting on 11/17/2017 for Discuss Biotin (has noticed since she stopped she has experienced hair loss); Recheck urine; and Flu Vaccine   HPI Frequent UTIs Patient comes in today with complaints of frequent urinary tract infections and starting to have some symptoms again.  Her daughter who is with her says that she is been complaining of a little bit of abdominal pain and a little bit of back pain and they were concerned that it might be coming back.  She gets these very well currently.  They deny her having any fevers or chills or blood in her urine.  She is currently staying at a nursing home refill and is brought here by her daughter.  Hair loss Patient's daughter says that she has been losing hair much more significantly since stopping the biotin and the daughter would like to resume it to see if it can help.  Relevant past medical, surgical, family and social history reviewed and updated as indicated. Interim medical history since our last visit reviewed. Allergies and medications reviewed and updated.  Review of Systems  Constitutional: Negative for chills and fever.  Eyes: Negative for visual disturbance.  Respiratory: Negative for chest tightness and shortness of breath.   Cardiovascular: Negative for chest pain and leg swelling.  Gastrointestinal: Positive for abdominal pain.  Genitourinary: Positive for flank pain and frequency. Negative for difficulty urinating, dysuria, hematuria, pelvic pain and urgency.  Musculoskeletal: Negative for back pain and gait problem.  Skin: Negative for rash.  Neurological: Negative for light-headedness and headaches.  Psychiatric/Behavioral: Negative for agitation and behavioral problems.    All other systems reviewed and are negative.   Per HPI unless specifically indicated above     Objective:    BP (!) 108/58   Pulse 84   Temp 98 F (36.7 C) (Oral)   Ht 5' (1.524 m)   Wt 126 lb (57.2 kg)   BMI 24.61 kg/m   Wt Readings from Last 3 Encounters:  11/17/17 126 lb (57.2 kg)  10/12/17 118 lb 9.6 oz (53.8 kg)  08/24/17 129 lb (58.5 kg)    Physical Exam  Constitutional: She is oriented to person, place, and time. She appears well-developed and well-nourished. No distress.  Eyes: Conjunctivae are normal.  Neck: Neck supple. No thyromegaly present.  Cardiovascular: Normal rate, regular rhythm, normal heart sounds and intact distal pulses.  No murmur heard. Pulmonary/Chest: Effort normal and breath sounds normal. No respiratory distress. She has no wheezes. She has no rales.  Abdominal: Soft. Bowel sounds are normal. She exhibits no distension. There is no tenderness. There is no rigidity, no rebound, no guarding and no CVA tenderness.  Musculoskeletal: Normal range of motion. She exhibits no edema or tenderness.  Lymphadenopathy:    She has no cervical adenopathy.  Neurological: She is alert and oriented to person, place, and time. Coordination normal.  Skin: Skin is warm and dry. No rash noted. She is not diaphoretic.  Psychiatric: She has a normal mood and affect. Her behavior is normal.  Nursing note and vitals reviewed.       Assessment & Plan:   Problem  List Items Addressed This Visit    None    Visit Diagnoses    Acute cystitis without hematuria    -  Primary   Relevant Medications   doxycycline (VIBRA-TABS) 100 MG tablet   nitrofurantoin, macrocrystal-monohydrate, (MACROBID) 100 MG capsule   Other Relevant Orders   Urine Culture (Completed)   Frequent UTI       Relevant Medications   nitrofurantoin, macrocrystal-monohydrate, (MACROBID) 100 MG capsule   Other Relevant Orders   Urinalysis, Complete (Completed)   Hair loss       May resume biotin  but we will have to monitor her kidney function closely   Encounter for immunization       Relevant Orders   Flu vaccine HIGH DOSE PF (Completed)   Flu vaccine HIGH DOSE PF (Completed)       Follow up plan: Return in about 3 months (around 02/15/2018), or if symptoms worsen or fail to improve, for recheck renal function.  Counseling provided for all of the vaccine components Orders Placed This Encounter  Procedures  . Urine Culture  . Urinalysis, Complete    Caryl Pina, MD [facility] 11/17/2017, 4:05 PM

## 2017-11-18 DIAGNOSIS — N3000 Acute cystitis without hematuria: Secondary | ICD-10-CM | POA: Diagnosis not present

## 2017-11-18 DIAGNOSIS — Z23 Encounter for immunization: Secondary | ICD-10-CM | POA: Diagnosis not present

## 2017-11-18 DIAGNOSIS — L659 Nonscarring hair loss, unspecified: Secondary | ICD-10-CM | POA: Diagnosis not present

## 2017-11-18 DIAGNOSIS — N390 Urinary tract infection, site not specified: Secondary | ICD-10-CM | POA: Diagnosis not present

## 2017-11-19 LAB — URINE CULTURE

## 2017-12-06 ENCOUNTER — Telehealth: Payer: Self-pay | Admitting: Family Medicine

## 2018-01-24 ENCOUNTER — Other Ambulatory Visit: Payer: Self-pay | Admitting: Family Medicine

## 2018-01-24 DIAGNOSIS — N3000 Acute cystitis without hematuria: Secondary | ICD-10-CM

## 2018-01-24 DIAGNOSIS — N390 Urinary tract infection, site not specified: Secondary | ICD-10-CM

## 2018-02-16 ENCOUNTER — Ambulatory Visit: Payer: Medicare HMO | Admitting: Family Medicine

## 2018-03-06 ENCOUNTER — Other Ambulatory Visit: Payer: Medicare HMO

## 2018-03-06 ENCOUNTER — Other Ambulatory Visit: Payer: Self-pay | Admitting: Family

## 2018-03-06 DIAGNOSIS — N390 Urinary tract infection, site not specified: Secondary | ICD-10-CM | POA: Diagnosis not present

## 2018-03-06 DIAGNOSIS — N3000 Acute cystitis without hematuria: Secondary | ICD-10-CM

## 2018-03-06 LAB — MICROSCOPIC EXAMINATION: Renal Epithel, UA: NONE SEEN /hpf

## 2018-03-06 LAB — URINALYSIS, COMPLETE
BILIRUBIN UA: NEGATIVE
GLUCOSE, UA: NEGATIVE
NITRITE UA: NEGATIVE
RBC UA: NEGATIVE
SPEC GRAV UA: 1.015 (ref 1.005–1.030)
UUROB: 0.2 mg/dL (ref 0.2–1.0)
pH, UA: 6.5 (ref 5.0–7.5)

## 2018-03-06 MED ORDER — CIPROFLOXACIN HCL 500 MG PO TABS: 500.0000 mg | ORAL_TABLET | Freq: Two times a day (BID) | ORAL | 0 refills | Status: DC

## 2018-03-06 NOTE — Progress Notes (Signed)
Patient takes macrobid daily.  She has not ask for a zanaflex in a month.  They have been advised to stop any zanaflex until Antibiotic,(Cipro) has been completed.

## 2018-03-06 NOTE — Progress Notes (Signed)
Is patient taking macrobid daily? If so, is she taking Zanaflex? Pt has several allergies and I was going to prescribe cipro, but there is possible reaction with cipro and zanaflex.

## 2018-03-06 NOTE — Addendum Note (Signed)
Addended by: Evelina Dun A on: 03/06/2018 04:19 PM   Modules accepted: Orders

## 2018-03-07 ENCOUNTER — Encounter: Payer: Self-pay | Admitting: Family Medicine

## 2018-03-07 ENCOUNTER — Ambulatory Visit (INDEPENDENT_AMBULATORY_CARE_PROVIDER_SITE_OTHER): Payer: Medicare HMO | Admitting: Family Medicine

## 2018-03-07 ENCOUNTER — Telehealth: Payer: Self-pay | Admitting: Family Medicine

## 2018-03-07 VITALS — BP 114/58 | HR 61 | Temp 97.6°F | Ht 60.0 in | Wt 125.0 lb

## 2018-03-07 DIAGNOSIS — E785 Hyperlipidemia, unspecified: Secondary | ICD-10-CM

## 2018-03-07 DIAGNOSIS — K591 Functional diarrhea: Secondary | ICD-10-CM

## 2018-03-07 DIAGNOSIS — R413 Other amnesia: Secondary | ICD-10-CM

## 2018-03-07 DIAGNOSIS — G301 Alzheimer's disease with late onset: Secondary | ICD-10-CM | POA: Diagnosis not present

## 2018-03-07 DIAGNOSIS — F0281 Dementia in other diseases classified elsewhere with behavioral disturbance: Secondary | ICD-10-CM | POA: Diagnosis not present

## 2018-03-07 DIAGNOSIS — I10 Essential (primary) hypertension: Secondary | ICD-10-CM | POA: Diagnosis not present

## 2018-03-07 DIAGNOSIS — R69 Illness, unspecified: Secondary | ICD-10-CM | POA: Diagnosis not present

## 2018-03-07 LAB — URINE CULTURE: Organism ID, Bacteria: NO GROWTH

## 2018-03-07 NOTE — Telephone Encounter (Signed)
Spoke with Dominica at [facility], they have received the medication from [facility] with directions on the label to dispense to patient, but they have to have a "paper order" to place on patient's MAR.  She said they cannot add a medication onto the patient's MAR without this.  Explained to her patient is to be seen today and Dr. Warrick Parisian will write Cipro on her paperwork that will come back with her.

## 2018-03-07 NOTE — Progress Notes (Signed)
BP (!) 114/58   Pulse 61   Temp 97.6 F (36.4 C) (Oral)   Ht 5' (1.524 m)   Wt 125 lb (56.7 kg)   BMI 24.41 kg/m    Subjective:    Patient ID: Sue Schaefer, female    DOB: [birth year], 84 y.o.   MRN: [id number]  HPI: Sue Schaefer is a 84 y.o. female presenting on 03/07/2018 for Hyperlipidemia; Hypertension; Episodes of diarrhea (medication orders are written to take fiber twice daily, daughter would like to change it to every other day.); and Recurrent UTI (patient's daughter brought urine by yesterday, has UTI, Cipro prescribed, need to sign written order to take back to facility)   HPI Hyperlipidemia Patient is coming in for recheck of his hyperlipidemia. The patient is currently taking no medication, we are just monitoring for now. They deny any issues with myalgias or history of liver damage from it. They deny any focal numbness or weakness or chest pain.   Hypertension Patient is currently on amlodipine and Imdur, and their blood pressure today is 114/58. Patient denies any lightheadedness or dizziness. Patient denies headaches, blurred vision, chest pains, shortness of breath, or weakness. Denies any side effects from medication and is content with current medication.   Alzheimer's and memory impairment Daughter feels like her memory impairment is gradually worsening, she forgets that she is eaten sometimes wants to go eat again.  She forgets other things that have happened very frequently.  Daughter says she will do well with math and things that she remembers from a long time ago but it just more recent events and recent occurrences that she forgets.  Patient has been having diarrhea and daughter thinks is because she is getting too much stool softener and fiber and we will write orders for nursing home to reduce both.  Patient denies any abdominal pain  Relevant past medical, surgical, family and social history reviewed and updated as indicated. Interim medical history since our  last visit reviewed. Allergies and medications reviewed and updated.  Review of Systems  Constitutional: Negative for chills and fever.  HENT: Negative for congestion, ear discharge and ear pain.   Eyes: Negative for redness and visual disturbance.  Respiratory: Negative for chest tightness and shortness of breath.   Cardiovascular: Negative for chest pain and leg swelling.  Gastrointestinal: Positive for diarrhea. Negative for abdominal distention, abdominal pain, blood in stool, constipation, nausea and vomiting.  Genitourinary: Negative for difficulty urinating and dysuria.  Musculoskeletal: Negative for back pain and gait problem.  Skin: Negative for rash.  Neurological: Negative for dizziness, light-headedness and headaches.  Psychiatric/Behavioral: Negative for agitation and behavioral problems.  All other systems reviewed and are negative.   Per HPI unless specifically indicated above   Allergies as of 03/07/2018      Reactions   Erythromycin Base Other (See Comments)   mycins"- "burned all the way through"   Keflex [cephalexin] Itching   Other Itching   Mycins made patient itch   Penicillins Cross Reactors    Patient is allergic to ALL MYCINS DRUGS!!!   Sulfamethoxazole Rash   unknown      Medication List        Accurate as of 03/07/18  3:59 PM. Always use your most recent med list.          alendronate 70 MG tablet Commonly known as:  FOSAMAX TAKE 1 TABLET ONCE A WEEK   amLODipine 5 MG tablet Commonly known as:  NORVASC Take 1 tablet (5  mg total) by mouth daily.   aspirin 81 MG chewable tablet Chew 81 mg by mouth daily.   Calcium-Vitamin D 600-200 MG-UNIT tablet Take 1 tablet by mouth.   ciprofloxacin 500 MG tablet Commonly known as:  CIPRO Take 1 tablet (500 mg total) by mouth 2 (two) times daily.   docusate sodium 100 MG capsule Commonly known as:  COLACE Take 1 capsule (100 mg total) by mouth 2 (two) times daily. To soften stool   donepezil 10  MG tablet Commonly known as:  ARICEPT Take 1 tablet (10 mg total) by mouth at bedtime as needed.   fluticasone furoate-vilanterol 200-25 MCG/INH Aepb Commonly known as:  BREO ELLIPTA Inhale 1 puff into the lungs daily.   furosemide 40 MG tablet Commonly known as:  LASIX TAKE 1 TABLET DAILY   isosorbide mononitrate 30 MG 24 hr tablet Commonly known as:  IMDUR Take 1 tablet (30 mg total) by mouth daily.   Lutein 6 MG Caps Take by mouth daily.   MYRBETRIQ 50 MG Tb24 tablet Generic drug:  mirabegron ER TAKE ONE TABLET AT BEDTIME   NAMENDA XR 28 MG Cp24 24 hr capsule Generic drug:  memantine TAKE (1) CAPSULE DAILY   nitrofurantoin (macrocrystal-monohydrate) 100 MG capsule Commonly known as:  MACROBID Take 100 mg by mouth 2 (two) times daily.   nitroGLYCERIN 0.4 MG SL tablet Commonly known as:  NITROSTAT Place 0.4 mg under the tongue every 5 (five) minutes as needed.   polycarbophil 625 MG tablet Commonly known as:  FIBERCON Take 625 mg by mouth 2 (two) times daily.   potassium chloride SA 20 MEQ tablet Commonly known as:  K-DUR,KLOR-CON TAKE 1 TABLET DAILY   prednisoLONE acetate 1 % ophthalmic suspension Commonly known as:  PRED FORTE Place 1 drop into the right eye 2 (two) times daily.   RA TURMERIC 500 MG Caps Generic drug:  Turmeric Take 1 capsule by mouth.   SYSTANE 0.4-0.3 % Soln Generic drug:  Polyethyl Glycol-Propyl Glycol Apply 1 drop to eye 4 (four) times daily.   tiZANidine 2 MG tablet Commonly known as:  ZANAFLEX TAKE 1 TABLET AT BEDTIME AS NEEDED FOR MUSCLE SPASMS   Transport Chair Misc 1 each by Does not apply route daily.   Urine Specimen Collection Kit Urine Specimen needed          Objective:    BP (!) 114/58   Pulse 61   Temp 97.6 F (36.4 C) (Oral)   Ht 5' (1.524 m)   Wt 125 lb (56.7 kg)   BMI 24.41 kg/m   Wt Readings from Last 3 Encounters:  03/07/18 125 lb (56.7 kg)  11/17/17 126 lb (57.2 kg)  10/12/17 118 lb 9.6 oz  (53.8 kg)    Physical Exam  Constitutional: She is oriented to person, place, and time. She appears well-developed and well-nourished. No distress.  Eyes: Pupils are equal, round, and reactive to light. Conjunctivae and EOM are normal.  Cardiovascular: Normal rate, regular rhythm, normal heart sounds and intact distal pulses.  No murmur heard. Pulmonary/Chest: Effort normal and breath sounds normal. No respiratory distress. She has no wheezes.  Abdominal: Soft. Bowel sounds are normal. She exhibits no distension. There is no tenderness. There is no guarding.  Musculoskeletal: Normal range of motion. She exhibits no edema.  Neurological: She is alert and oriented to person, place, and time. Coordination normal.  Skin: Skin is warm and dry. No rash noted. She is not diaphoretic.  Psychiatric: She has a normal  mood and affect. Her behavior is normal.  Nursing note and vitals reviewed.       Assessment & Plan:   Problem List Items Addressed This Visit      Cardiovascular and Mediastinum   Essential hypertension, benign     Nervous and Auditory   Dementia with behavioral disturbance     Other   Hyperlipidemia LDL goal <130 - Primary   RESOLVED: Memory impairment    Other Visit Diagnoses    Functional diarrhea       Reduce fiber intake supplement to every other day       Follow up plan: Return if symptoms worsen or fail to improve.  Counseling provided for all of the vaccine components No orders of the defined types were placed in this encounter.   Caryl Pina, MD [facility] 03/07/2018, 4:00 PM

## 2018-03-20 ENCOUNTER — Other Ambulatory Visit: Payer: Self-pay | Admitting: Family Medicine

## 2018-03-20 DIAGNOSIS — N3000 Acute cystitis without hematuria: Secondary | ICD-10-CM

## 2018-03-20 DIAGNOSIS — N390 Urinary tract infection, site not specified: Secondary | ICD-10-CM

## 2018-06-28 DIAGNOSIS — M6281 Muscle weakness (generalized): Secondary | ICD-10-CM | POA: Diagnosis not present

## 2018-06-28 DIAGNOSIS — M1711 Unilateral primary osteoarthritis, right knee: Secondary | ICD-10-CM | POA: Diagnosis not present

## 2018-06-29 DIAGNOSIS — M1711 Unilateral primary osteoarthritis, right knee: Secondary | ICD-10-CM | POA: Diagnosis not present

## 2018-06-29 DIAGNOSIS — M6281 Muscle weakness (generalized): Secondary | ICD-10-CM | POA: Diagnosis not present

## 2018-07-06 DIAGNOSIS — M6281 Muscle weakness (generalized): Secondary | ICD-10-CM | POA: Diagnosis not present

## 2018-07-06 DIAGNOSIS — M1711 Unilateral primary osteoarthritis, right knee: Secondary | ICD-10-CM | POA: Diagnosis not present

## 2018-07-07 DIAGNOSIS — M6281 Muscle weakness (generalized): Secondary | ICD-10-CM | POA: Diagnosis not present

## 2018-07-07 DIAGNOSIS — M1711 Unilateral primary osteoarthritis, right knee: Secondary | ICD-10-CM | POA: Diagnosis not present

## 2018-07-13 DIAGNOSIS — M6281 Muscle weakness (generalized): Secondary | ICD-10-CM | POA: Diagnosis not present

## 2018-07-13 DIAGNOSIS — M1711 Unilateral primary osteoarthritis, right knee: Secondary | ICD-10-CM | POA: Diagnosis not present

## 2018-07-14 ENCOUNTER — Other Ambulatory Visit: Payer: Medicare HMO

## 2018-07-14 DIAGNOSIS — M6281 Muscle weakness (generalized): Secondary | ICD-10-CM | POA: Diagnosis not present

## 2018-07-14 DIAGNOSIS — N390 Urinary tract infection, site not specified: Secondary | ICD-10-CM | POA: Diagnosis not present

## 2018-07-14 DIAGNOSIS — M1711 Unilateral primary osteoarthritis, right knee: Secondary | ICD-10-CM | POA: Diagnosis not present

## 2018-07-14 LAB — URINALYSIS, COMPLETE
Bilirubin, UA: NEGATIVE
Glucose, UA: NEGATIVE
Nitrite, UA: NEGATIVE
Protein, UA: NEGATIVE
RBC, UA: NEGATIVE
Specific Gravity, UA: 1.015 (ref 1.005–1.030)
Urobilinogen, Ur: 0.2 mg/dL (ref 0.2–1.0)
pH, UA: 5.5 (ref 5.0–7.5)

## 2018-07-14 LAB — MICROSCOPIC EXAMINATION: RBC, UA: NONE SEEN /hpf (ref 0–2)

## 2018-07-15 LAB — URINE CULTURE: ORGANISM ID, BACTERIA: NO GROWTH

## 2018-07-18 ENCOUNTER — Telehealth: Payer: Self-pay | Admitting: Family Medicine

## 2018-07-18 DIAGNOSIS — M6281 Muscle weakness (generalized): Secondary | ICD-10-CM | POA: Diagnosis not present

## 2018-07-18 DIAGNOSIS — M1711 Unilateral primary osteoarthritis, right knee: Secondary | ICD-10-CM | POA: Diagnosis not present

## 2018-07-18 NOTE — Telephone Encounter (Signed)
Daughter aware.

## 2018-07-19 ENCOUNTER — Telehealth: Payer: Self-pay | Admitting: Family Medicine

## 2018-07-19 NOTE — Telephone Encounter (Signed)
Aware of results.  Results faxed [phone]

## 2018-07-20 DIAGNOSIS — M1711 Unilateral primary osteoarthritis, right knee: Secondary | ICD-10-CM | POA: Diagnosis not present

## 2018-07-20 DIAGNOSIS — M6281 Muscle weakness (generalized): Secondary | ICD-10-CM | POA: Diagnosis not present

## 2018-07-27 DIAGNOSIS — M6281 Muscle weakness (generalized): Secondary | ICD-10-CM | POA: Diagnosis not present

## 2018-07-27 DIAGNOSIS — M1711 Unilateral primary osteoarthritis, right knee: Secondary | ICD-10-CM | POA: Diagnosis not present

## 2018-07-28 DIAGNOSIS — M6281 Muscle weakness (generalized): Secondary | ICD-10-CM | POA: Diagnosis not present

## 2018-07-28 DIAGNOSIS — M1711 Unilateral primary osteoarthritis, right knee: Secondary | ICD-10-CM | POA: Diagnosis not present

## 2018-08-03 DIAGNOSIS — M6281 Muscle weakness (generalized): Secondary | ICD-10-CM | POA: Diagnosis not present

## 2018-08-03 DIAGNOSIS — M1711 Unilateral primary osteoarthritis, right knee: Secondary | ICD-10-CM | POA: Diagnosis not present

## 2018-08-04 DIAGNOSIS — M1711 Unilateral primary osteoarthritis, right knee: Secondary | ICD-10-CM | POA: Diagnosis not present

## 2018-08-04 DIAGNOSIS — M6281 Muscle weakness (generalized): Secondary | ICD-10-CM | POA: Diagnosis not present

## 2018-08-08 DIAGNOSIS — M1711 Unilateral primary osteoarthritis, right knee: Secondary | ICD-10-CM | POA: Diagnosis not present

## 2018-08-08 DIAGNOSIS — M6281 Muscle weakness (generalized): Secondary | ICD-10-CM | POA: Diagnosis not present

## 2018-08-10 DIAGNOSIS — M1711 Unilateral primary osteoarthritis, right knee: Secondary | ICD-10-CM | POA: Diagnosis not present

## 2018-08-10 DIAGNOSIS — M6281 Muscle weakness (generalized): Secondary | ICD-10-CM | POA: Diagnosis not present

## 2018-08-15 DIAGNOSIS — M1711 Unilateral primary osteoarthritis, right knee: Secondary | ICD-10-CM | POA: Diagnosis not present

## 2018-08-15 DIAGNOSIS — M6281 Muscle weakness (generalized): Secondary | ICD-10-CM | POA: Diagnosis not present

## 2018-08-17 DIAGNOSIS — M1711 Unilateral primary osteoarthritis, right knee: Secondary | ICD-10-CM | POA: Diagnosis not present

## 2018-08-17 DIAGNOSIS — M6281 Muscle weakness (generalized): Secondary | ICD-10-CM | POA: Diagnosis not present

## 2018-08-28 DIAGNOSIS — M6281 Muscle weakness (generalized): Secondary | ICD-10-CM | POA: Diagnosis not present

## 2018-08-28 DIAGNOSIS — M1711 Unilateral primary osteoarthritis, right knee: Secondary | ICD-10-CM | POA: Diagnosis not present

## 2018-08-31 DIAGNOSIS — M6281 Muscle weakness (generalized): Secondary | ICD-10-CM | POA: Diagnosis not present

## 2018-08-31 DIAGNOSIS — M1711 Unilateral primary osteoarthritis, right knee: Secondary | ICD-10-CM | POA: Diagnosis not present

## 2018-09-04 DIAGNOSIS — M6281 Muscle weakness (generalized): Secondary | ICD-10-CM | POA: Diagnosis not present

## 2018-09-04 DIAGNOSIS — M1711 Unilateral primary osteoarthritis, right knee: Secondary | ICD-10-CM | POA: Diagnosis not present

## 2018-09-07 DIAGNOSIS — M1711 Unilateral primary osteoarthritis, right knee: Secondary | ICD-10-CM | POA: Diagnosis not present

## 2018-09-07 DIAGNOSIS — M6281 Muscle weakness (generalized): Secondary | ICD-10-CM | POA: Diagnosis not present

## 2018-09-11 DIAGNOSIS — M6281 Muscle weakness (generalized): Secondary | ICD-10-CM | POA: Diagnosis not present

## 2018-09-11 DIAGNOSIS — M1711 Unilateral primary osteoarthritis, right knee: Secondary | ICD-10-CM | POA: Diagnosis not present

## 2018-09-14 DIAGNOSIS — M6281 Muscle weakness (generalized): Secondary | ICD-10-CM | POA: Diagnosis not present

## 2018-09-14 DIAGNOSIS — M1711 Unilateral primary osteoarthritis, right knee: Secondary | ICD-10-CM | POA: Diagnosis not present

## 2018-09-18 DIAGNOSIS — M6281 Muscle weakness (generalized): Secondary | ICD-10-CM | POA: Diagnosis not present

## 2018-09-18 DIAGNOSIS — M1711 Unilateral primary osteoarthritis, right knee: Secondary | ICD-10-CM | POA: Diagnosis not present

## 2018-09-21 DIAGNOSIS — M1711 Unilateral primary osteoarthritis, right knee: Secondary | ICD-10-CM | POA: Diagnosis not present

## 2018-09-21 DIAGNOSIS — M6281 Muscle weakness (generalized): Secondary | ICD-10-CM | POA: Diagnosis not present

## 2018-09-25 DIAGNOSIS — M1711 Unilateral primary osteoarthritis, right knee: Secondary | ICD-10-CM | POA: Diagnosis not present

## 2018-09-25 DIAGNOSIS — M6281 Muscle weakness (generalized): Secondary | ICD-10-CM | POA: Diagnosis not present

## 2018-09-28 DIAGNOSIS — M1711 Unilateral primary osteoarthritis, right knee: Secondary | ICD-10-CM | POA: Diagnosis not present

## 2018-09-28 DIAGNOSIS — M6281 Muscle weakness (generalized): Secondary | ICD-10-CM | POA: Diagnosis not present

## 2018-10-02 DIAGNOSIS — M6281 Muscle weakness (generalized): Secondary | ICD-10-CM | POA: Diagnosis not present

## 2018-10-02 DIAGNOSIS — M1711 Unilateral primary osteoarthritis, right knee: Secondary | ICD-10-CM | POA: Diagnosis not present

## 2018-10-05 DIAGNOSIS — M1711 Unilateral primary osteoarthritis, right knee: Secondary | ICD-10-CM | POA: Diagnosis not present

## 2018-10-05 DIAGNOSIS — M6281 Muscle weakness (generalized): Secondary | ICD-10-CM | POA: Diagnosis not present

## 2018-10-09 DIAGNOSIS — M1711 Unilateral primary osteoarthritis, right knee: Secondary | ICD-10-CM | POA: Diagnosis not present

## 2018-10-09 DIAGNOSIS — M6281 Muscle weakness (generalized): Secondary | ICD-10-CM | POA: Diagnosis not present

## 2018-10-09 DIAGNOSIS — R69 Illness, unspecified: Secondary | ICD-10-CM | POA: Diagnosis not present

## 2018-10-10 DIAGNOSIS — M6281 Muscle weakness (generalized): Secondary | ICD-10-CM | POA: Diagnosis not present

## 2018-10-10 DIAGNOSIS — M1711 Unilateral primary osteoarthritis, right knee: Secondary | ICD-10-CM | POA: Diagnosis not present

## 2018-10-16 DIAGNOSIS — M1711 Unilateral primary osteoarthritis, right knee: Secondary | ICD-10-CM | POA: Diagnosis not present

## 2018-10-16 DIAGNOSIS — M6281 Muscle weakness (generalized): Secondary | ICD-10-CM | POA: Diagnosis not present

## 2018-10-19 DIAGNOSIS — M6281 Muscle weakness (generalized): Secondary | ICD-10-CM | POA: Diagnosis not present

## 2018-10-19 DIAGNOSIS — M1711 Unilateral primary osteoarthritis, right knee: Secondary | ICD-10-CM | POA: Diagnosis not present

## 2019-01-01 ENCOUNTER — Other Ambulatory Visit: Payer: Medicare HMO

## 2019-01-01 DIAGNOSIS — N390 Urinary tract infection, site not specified: Secondary | ICD-10-CM | POA: Diagnosis not present

## 2019-01-01 NOTE — Progress Notes (Signed)
Daughter brought in a urine to be tested and if abx would like it sent to [facility].

## 2019-01-03 LAB — URINE CULTURE

## 2019-01-26 DIAGNOSIS — B351 Tinea unguium: Secondary | ICD-10-CM | POA: Diagnosis not present

## 2019-01-26 DIAGNOSIS — M79675 Pain in left toe(s): Secondary | ICD-10-CM | POA: Diagnosis not present

## 2019-01-26 DIAGNOSIS — I739 Peripheral vascular disease, unspecified: Secondary | ICD-10-CM | POA: Diagnosis not present

## 2019-01-26 DIAGNOSIS — M2042 Other hammer toe(s) (acquired), left foot: Secondary | ICD-10-CM | POA: Diagnosis not present

## 2019-04-05 ENCOUNTER — Telehealth: Payer: Self-pay | Admitting: Family Medicine

## 2019-04-05 NOTE — Telephone Encounter (Signed)
Pleas review and advise.

## 2019-04-05 NOTE — Telephone Encounter (Signed)
Pharmacy wanted to review med list and make sure it was up to date.

## 2019-06-08 DIAGNOSIS — B351 Tinea unguium: Secondary | ICD-10-CM | POA: Diagnosis not present

## 2019-06-08 DIAGNOSIS — I739 Peripheral vascular disease, unspecified: Secondary | ICD-10-CM | POA: Diagnosis not present

## 2019-06-08 DIAGNOSIS — M79675 Pain in left toe(s): Secondary | ICD-10-CM | POA: Diagnosis not present

## 2019-08-16 DIAGNOSIS — Z20828 Contact with and (suspected) exposure to other viral communicable diseases: Secondary | ICD-10-CM | POA: Diagnosis not present

## 2019-08-16 DIAGNOSIS — U071 COVID-19: Secondary | ICD-10-CM | POA: Diagnosis not present

## 2019-08-21 ENCOUNTER — Other Ambulatory Visit: Payer: Self-pay

## 2019-08-22 ENCOUNTER — Ambulatory Visit (INDEPENDENT_AMBULATORY_CARE_PROVIDER_SITE_OTHER): Payer: Medicare HMO | Admitting: Family Medicine

## 2019-08-22 ENCOUNTER — Encounter: Payer: Self-pay | Admitting: Family Medicine

## 2019-08-22 DIAGNOSIS — E785 Hyperlipidemia, unspecified: Secondary | ICD-10-CM | POA: Diagnosis not present

## 2019-08-22 DIAGNOSIS — I10 Essential (primary) hypertension: Secondary | ICD-10-CM | POA: Diagnosis not present

## 2019-08-22 DIAGNOSIS — F0281 Dementia in other diseases classified elsewhere with behavioral disturbance: Secondary | ICD-10-CM

## 2019-08-22 DIAGNOSIS — G301 Alzheimer's disease with late onset: Secondary | ICD-10-CM

## 2019-08-22 DIAGNOSIS — R69 Illness, unspecified: Secondary | ICD-10-CM | POA: Diagnosis not present

## 2019-08-22 DIAGNOSIS — F02818 Dementia in other diseases classified elsewhere, unspecified severity, with other behavioral disturbance: Secondary | ICD-10-CM

## 2019-08-22 NOTE — Progress Notes (Signed)
Virtual Visit via telephone Note  I connected with Sue Schaefer on 08/22/19 at 1428 by telephone and verified that I am speaking with the correct person using two identifiers. Sue Schaefer is currently located at nursing home and caretaker are currently with her during visit. The provider, Fransisca Kaufmann , MD is located in their office at time of visit.  Call ended at 1456  I discussed the limitations, risks, security and privacy concerns of performing an evaluation and management service by telephone and the availability of in person appointments. I also discussed with the patient that there may be a patient responsible charge related to this service. The patient expressed understanding and agreed to proceed.   History and Present Illness: Hypertension Patient is currently on amlodipine and imdur, and their blood pressure today is unknown. Patient denies any lightheadedness or dizziness. Patient denies headaches, blurred vision, chest pains, shortness of breath, or weakness. Denies any side effects from medication and is content with current medication.   Frequent utis and takes nitrofurantoin everyday for prevention and it is working.  Dementia  Patient is taking both Aricept and namenda and she has occasional confusion but not too much.  Family is seeing her through the window.   patient takes alendronate and hasn't had any falls or fractures and is doing well.   No diagnosis found.  Outpatient Encounter Medications as of 08/22/2019  Medication Sig  . alendronate (FOSAMAX) 70 MG tablet TAKE 1 TABLET ONCE A WEEK  . amLODipine (NORVASC) 5 MG tablet Take 1 tablet (5 mg total) by mouth daily.  Marland Kitchen aspirin 81 MG chewable tablet Chew 81 mg by mouth daily.    . Calcium-Vitamin D 600-200 MG-UNIT per tablet Take 1 tablet by mouth.  . ciprofloxacin (CIPRO) 500 MG tablet Take 1 tablet (500 mg total) by mouth 2 (two) times daily.  Marland Kitchen docusate sodium (COLACE) 100 MG capsule Take 1 capsule (100  mg total) by mouth 2 (two) times daily. To soften stool  . donepezil (ARICEPT) 10 MG tablet Take 1 tablet (10 mg total) by mouth at bedtime as needed.  . fluticasone furoate-vilanterol (BREO ELLIPTA) 200-25 MCG/INH AEPB Inhale 1 puff into the lungs daily.  . furosemide (LASIX) 40 MG tablet TAKE 1 TABLET DAILY  . isosorbide mononitrate (IMDUR) 30 MG 24 hr tablet Take 1 tablet (30 mg total) by mouth daily.  . Lutein 6 MG CAPS Take by mouth daily.    . Misc. Devices (TRANSPORT CHAIR) MISC 1 each by Does not apply route daily.  Marland Kitchen MYRBETRIQ 50 MG TB24 tablet TAKE ONE TABLET AT BEDTIME  . NAMENDA XR 28 MG CP24 24 hr capsule TAKE (1) CAPSULE DAILY  . nitrofurantoin, macrocrystal-monohydrate, (MACROBID) 100 MG capsule Take 100 mg by mouth 2 (two) times daily.  . nitroGLYCERIN (NITROSTAT) 0.4 MG SL tablet Place 0.4 mg under the tongue every 5 (five) minutes as needed.    . polycarbophil (FIBERCON) 625 MG tablet Take 625 mg by mouth 2 (two) times daily.    Vladimir Faster Glycol-Propyl Glycol (SYSTANE) 0.4-0.3 % SOLN Apply 1 drop to eye 4 (four) times daily.  . potassium chloride SA (K-DUR,KLOR-CON) 20 MEQ tablet TAKE 1 TABLET DAILY  . prednisoLONE acetate (PRED FORTE) 1 % ophthalmic suspension Place 1 drop into the right eye 2 (two) times daily.  Marland Kitchen tiZANidine (ZANAFLEX) 2 MG tablet TAKE 1 TABLET AT BEDTIME AS NEEDED FOR MUSCLE SPASMS  . Turmeric (RA TURMERIC) 500 MG CAPS Take 1 capsule by mouth.  Marland Kitchen  Urine Specimen Collection KIT Urine Specimen needed   No facility-administered encounter medications on file as of 08/22/2019.     Review of Systems  Constitutional: Negative for chills and fever.  Eyes: Negative for visual disturbance.  Respiratory: Negative for chest tightness and shortness of breath.   Cardiovascular: Negative for chest pain and leg swelling.  Genitourinary: Negative for difficulty urinating and dysuria.  Musculoskeletal: Negative for back pain and gait problem.  Skin: Negative for  rash.  Neurological: Negative for light-headedness and headaches.  Psychiatric/Behavioral: Positive for confusion. Negative for agitation, behavioral problems, decreased concentration, self-injury, sleep disturbance and suicidal ideas. The patient is not nervous/anxious.   All other systems reviewed and are negative.   Observations/Objective: Patient sounds comfortable and in no acute distress  Assessment and Plan: Problem List Items Addressed This Visit      Cardiovascular and Mediastinum   Essential hypertension, benign     Nervous and Auditory   Dementia with behavioral disturbance (HCC)     Other   Hyperlipidemia LDL goal <130 - Primary      Gave verbal for CBC and chemistry panel.  D/c glucomasimine and turmeric, they are large and she is not having joint and stop eye caps  Stop fibercon and give metamucil prn  Stop alendronate and calcium because of age and decreased mobility.  Follow Up Instructions:  Follow up in 6 months.   I discussed the assessment and treatment plan with the patient. The patient was provided an opportunity to ask questions and all were answered. The patient agreed with the plan and demonstrated an understanding of the instructions.   The patient was advised to call back or seek an in-person evaluation if the symptoms worsen or if the condition fails to improve as anticipated.  The above assessment and management plan was discussed with the patient. The patient verbalized understanding of and has agreed to the management plan. Patient is aware to call the clinic if symptoms persist or worsen. Patient is aware when to return to the clinic for a follow-up visit. Patient educated on when it is appropriate to go to the emergency department.    I provided 28 minutes of non-face-to-face time during this encounter.    Worthy Rancher, MD

## 2019-09-10 DIAGNOSIS — B351 Tinea unguium: Secondary | ICD-10-CM | POA: Diagnosis not present

## 2019-09-10 DIAGNOSIS — I739 Peripheral vascular disease, unspecified: Secondary | ICD-10-CM | POA: Diagnosis not present

## 2019-09-10 DIAGNOSIS — M79675 Pain in left toe(s): Secondary | ICD-10-CM | POA: Diagnosis not present

## 2019-10-22 ENCOUNTER — Telehealth: Payer: Self-pay | Admitting: Family Medicine

## 2019-10-22 DIAGNOSIS — I2510 Atherosclerotic heart disease of native coronary artery without angina pectoris: Secondary | ICD-10-CM | POA: Diagnosis not present

## 2019-10-22 DIAGNOSIS — U071 COVID-19: Secondary | ICD-10-CM | POA: Diagnosis not present

## 2019-10-22 DIAGNOSIS — X58XXXA Exposure to other specified factors, initial encounter: Secondary | ICD-10-CM | POA: Diagnosis not present

## 2019-10-22 DIAGNOSIS — R69 Illness, unspecified: Secondary | ICD-10-CM | POA: Diagnosis not present

## 2019-10-22 DIAGNOSIS — Z881 Allergy status to other antibiotic agents status: Secondary | ICD-10-CM | POA: Diagnosis not present

## 2019-10-22 DIAGNOSIS — R5381 Other malaise: Secondary | ICD-10-CM | POA: Diagnosis not present

## 2019-10-22 DIAGNOSIS — K219 Gastro-esophageal reflux disease without esophagitis: Secondary | ICD-10-CM | POA: Diagnosis not present

## 2019-10-22 DIAGNOSIS — I129 Hypertensive chronic kidney disease with stage 1 through stage 4 chronic kidney disease, or unspecified chronic kidney disease: Secondary | ICD-10-CM | POA: Diagnosis not present

## 2019-10-22 DIAGNOSIS — Z882 Allergy status to sulfonamides status: Secondary | ICD-10-CM | POA: Diagnosis not present

## 2019-10-22 DIAGNOSIS — R404 Transient alteration of awareness: Secondary | ICD-10-CM | POA: Diagnosis not present

## 2019-10-22 DIAGNOSIS — Z0389 Encounter for observation for other suspected diseases and conditions ruled out: Secondary | ICD-10-CM | POA: Diagnosis not present

## 2019-10-22 DIAGNOSIS — I959 Hypotension, unspecified: Secondary | ICD-10-CM | POA: Diagnosis not present

## 2019-10-22 DIAGNOSIS — Z20828 Contact with and (suspected) exposure to other viral communicable diseases: Secondary | ICD-10-CM | POA: Diagnosis not present

## 2019-10-22 DIAGNOSIS — J449 Chronic obstructive pulmonary disease, unspecified: Secondary | ICD-10-CM | POA: Diagnosis not present

## 2019-10-22 DIAGNOSIS — N189 Chronic kidney disease, unspecified: Secondary | ICD-10-CM | POA: Diagnosis not present

## 2019-10-22 DIAGNOSIS — Z880 Allergy status to penicillin: Secondary | ICD-10-CM | POA: Diagnosis not present

## 2019-10-22 DIAGNOSIS — S2242XA Multiple fractures of ribs, left side, initial encounter for closed fracture: Secondary | ICD-10-CM | POA: Diagnosis not present

## 2019-10-22 NOTE — Telephone Encounter (Signed)
Patient is at [place] [facility] called today to tell us her BP was 70/40. Spoke with [place] he advised to call EMs to evaluate.

## 2019-10-23 DIAGNOSIS — R69 Illness, unspecified: Secondary | ICD-10-CM | POA: Diagnosis not present

## 2019-10-23 DIAGNOSIS — Z743 Need for continuous supervision: Secondary | ICD-10-CM | POA: Diagnosis not present

## 2019-10-24 DIAGNOSIS — U071 COVID-19: Secondary | ICD-10-CM | POA: Diagnosis not present

## 2019-10-24 DIAGNOSIS — Z20828 Contact with and (suspected) exposure to other viral communicable diseases: Secondary | ICD-10-CM | POA: Diagnosis not present

## 2019-10-26 ENCOUNTER — Telehealth: Payer: Self-pay | Admitting: Family Medicine

## 2019-10-26 MED ORDER — DEXAMETHASONE 4 MG PO TABS: 4.0000 mg | ORAL_TABLET | Freq: Every day | ORAL | 1 refills | Status: DC

## 2019-10-26 MED ORDER — VITAMIN C 500 MG PO CHEW: 1.0000 | CHEWABLE_TABLET | Freq: Every day | ORAL | 0 refills | Status: AC

## 2019-10-26 MED ORDER — ZINC GLUCONATE 50 MG PO TABS: 50.0000 mg | ORAL_TABLET | Freq: Every day | ORAL | 0 refills | Status: AC

## 2019-10-26 NOTE — Telephone Encounter (Signed)
Please let him know that I sent the medication to [facility] for them

## 2019-10-26 NOTE — Telephone Encounter (Signed)
Gabriel Cirri from [facility] called stating that patient tested positive for COVID. Wants to know if patient needs to be put on any medications for it. Says BP has been lower than normal without taking BP meds so BP meds have been held off on taking for now. Last BP reading was 100/63

## 2019-10-29 NOTE — Telephone Encounter (Signed)
Attempted to contact [facility]

## 2019-11-06 ENCOUNTER — Telehealth: Payer: Self-pay | Admitting: *Deleted

## 2019-11-06 ENCOUNTER — Telehealth (INDEPENDENT_AMBULATORY_CARE_PROVIDER_SITE_OTHER): Payer: Self-pay | Admitting: Family Medicine

## 2019-11-06 NOTE — Telephone Encounter (Signed)
Spoke with Jarrett Soho from [employer].  Sue Schaefer has been having some borderline low blood pressures but these have been chronic for her.  Today's blood pressure was 116 over 80s.  Her oxygen was normal at 98%.  She has had some chest congestion which is new for her.  She is not having any shortness of breath or fevers.  There is concern because she is actively positive for COVID-19.  Given new onset of symptoms, plan for chest x-ray.  I ordered a stat 2 view chest x-ray and will fax to the number that she provided at [phone].  She will call me if the chest x-ray results are concerning.  Certainly if she declines, low threshold to have her evaluated in the emergency department.  Edrie Ehrich M. Lajuana Ripple, [facility]

## 2019-11-06 NOTE — Telephone Encounter (Signed)
Sue Schaefer aware of recommendations

## 2019-11-06 NOTE — Telephone Encounter (Signed)
If there is concern for dehydration and hypotension I most certainly would have her evaluated emergency department again.  She likely will need IV fluids.  If she is having any shortness of breath or if cough is worsening low threshold to repeat chest x-ray as she certainly is at increased risk for developing a secondary pneumonia.

## 2019-11-06 NOTE — Telephone Encounter (Signed)
TC w/ Juliann Pulse at [facility] for order for UA/C&S, pt did not know daughter Pt is covid +, she recently went to [facility] for low BPs, had CXR done but was sent back to [facility] Pt's BP & O2 is being monitored Q8 hrs, pt has no appetite, [facility] is pushing fluids, soups as best they can. Pt also has cough w/ some mucous Please advise

## 2019-11-07 ENCOUNTER — Emergency Department (HOSPITAL_COMMUNITY): Payer: Medicare HMO

## 2019-11-07 ENCOUNTER — Inpatient Hospital Stay (HOSPITAL_COMMUNITY)
Admission: EM | Admit: 2019-11-07 | Discharge: 2019-12-10 | DRG: 871 | Disposition: E | Payer: Medicare HMO | Attending: Internal Medicine | Admitting: Internal Medicine

## 2019-11-07 ENCOUNTER — Other Ambulatory Visit: Payer: Self-pay

## 2019-11-07 ENCOUNTER — Encounter (HOSPITAL_COMMUNITY): Payer: Self-pay

## 2019-11-07 DIAGNOSIS — E785 Hyperlipidemia, unspecified: Secondary | ICD-10-CM | POA: Diagnosis not present

## 2019-11-07 DIAGNOSIS — D631 Anemia in chronic kidney disease: Secondary | ICD-10-CM | POA: Diagnosis present

## 2019-11-07 DIAGNOSIS — I25810 Atherosclerosis of coronary artery bypass graft(s) without angina pectoris: Secondary | ICD-10-CM | POA: Diagnosis present

## 2019-11-07 DIAGNOSIS — I339 Acute and subacute endocarditis, unspecified: Secondary | ICD-10-CM | POA: Diagnosis not present

## 2019-11-07 DIAGNOSIS — E878 Other disorders of electrolyte and fluid balance, not elsewhere classified: Secondary | ICD-10-CM | POA: Diagnosis not present

## 2019-11-07 DIAGNOSIS — Z7951 Long term (current) use of inhaled steroids: Secondary | ICD-10-CM

## 2019-11-07 DIAGNOSIS — Z66 Do not resuscitate: Secondary | ICD-10-CM | POA: Diagnosis present

## 2019-11-07 DIAGNOSIS — E870 Hyperosmolality and hypernatremia: Secondary | ICD-10-CM | POA: Diagnosis not present

## 2019-11-07 DIAGNOSIS — J189 Pneumonia, unspecified organism: Secondary | ICD-10-CM

## 2019-11-07 DIAGNOSIS — I82401 Acute embolism and thrombosis of unspecified deep veins of right lower extremity: Secondary | ICD-10-CM | POA: Diagnosis not present

## 2019-11-07 DIAGNOSIS — I129 Hypertensive chronic kidney disease with stage 1 through stage 4 chronic kidney disease, or unspecified chronic kidney disease: Secondary | ICD-10-CM | POA: Diagnosis present

## 2019-11-07 DIAGNOSIS — Z681 Body mass index (BMI) 19 or less, adult: Secondary | ICD-10-CM

## 2019-11-07 DIAGNOSIS — N1832 Chronic kidney disease, stage 3b: Secondary | ICD-10-CM | POA: Diagnosis not present

## 2019-11-07 DIAGNOSIS — Z7982 Long term (current) use of aspirin: Secondary | ICD-10-CM

## 2019-11-07 DIAGNOSIS — Z888 Allergy status to other drugs, medicaments and biological substances status: Secondary | ICD-10-CM

## 2019-11-07 DIAGNOSIS — M810 Age-related osteoporosis without current pathological fracture: Secondary | ICD-10-CM | POA: Diagnosis present

## 2019-11-07 DIAGNOSIS — I2510 Atherosclerotic heart disease of native coronary artery without angina pectoris: Secondary | ICD-10-CM | POA: Diagnosis present

## 2019-11-07 DIAGNOSIS — A4189 Other specified sepsis: Principal | ICD-10-CM | POA: Diagnosis present

## 2019-11-07 DIAGNOSIS — R404 Transient alteration of awareness: Secondary | ICD-10-CM | POA: Diagnosis not present

## 2019-11-07 DIAGNOSIS — I340 Nonrheumatic mitral (valve) insufficiency: Secondary | ICD-10-CM | POA: Diagnosis not present

## 2019-11-07 DIAGNOSIS — R6521 Severe sepsis with septic shock: Secondary | ICD-10-CM | POA: Diagnosis not present

## 2019-11-07 DIAGNOSIS — R069 Unspecified abnormalities of breathing: Secondary | ICD-10-CM | POA: Diagnosis not present

## 2019-11-07 DIAGNOSIS — E876 Hypokalemia: Secondary | ICD-10-CM | POA: Diagnosis present

## 2019-11-07 DIAGNOSIS — I471 Supraventricular tachycardia: Secondary | ICD-10-CM | POA: Diagnosis present

## 2019-11-07 DIAGNOSIS — J1282 Pneumonia due to coronavirus disease 2019: Secondary | ICD-10-CM | POA: Diagnosis present

## 2019-11-07 DIAGNOSIS — N179 Acute kidney failure, unspecified: Secondary | ICD-10-CM | POA: Diagnosis present

## 2019-11-07 DIAGNOSIS — I2699 Other pulmonary embolism without acute cor pulmonale: Secondary | ICD-10-CM | POA: Diagnosis not present

## 2019-11-07 DIAGNOSIS — E861 Hypovolemia: Secondary | ICD-10-CM | POA: Diagnosis present

## 2019-11-07 DIAGNOSIS — F0391 Unspecified dementia with behavioral disturbance: Secondary | ICD-10-CM | POA: Diagnosis present

## 2019-11-07 DIAGNOSIS — E872 Acidosis: Secondary | ICD-10-CM | POA: Diagnosis present

## 2019-11-07 DIAGNOSIS — K920 Hematemesis: Secondary | ICD-10-CM | POA: Diagnosis not present

## 2019-11-07 DIAGNOSIS — I10 Essential (primary) hypertension: Secondary | ICD-10-CM | POA: Diagnosis present

## 2019-11-07 DIAGNOSIS — Z881 Allergy status to other antibiotic agents status: Secondary | ICD-10-CM

## 2019-11-07 DIAGNOSIS — Z79899 Other long term (current) drug therapy: Secondary | ICD-10-CM

## 2019-11-07 DIAGNOSIS — J9601 Acute respiratory failure with hypoxia: Secondary | ICD-10-CM | POA: Diagnosis not present

## 2019-11-07 DIAGNOSIS — R69 Illness, unspecified: Secondary | ICD-10-CM | POA: Diagnosis not present

## 2019-11-07 DIAGNOSIS — Z4659 Encounter for fitting and adjustment of other gastrointestinal appliance and device: Secondary | ICD-10-CM

## 2019-11-07 DIAGNOSIS — I82491 Acute embolism and thrombosis of other specified deep vein of right lower extremity: Secondary | ICD-10-CM | POA: Diagnosis present

## 2019-11-07 DIAGNOSIS — U071 COVID-19: Secondary | ICD-10-CM | POA: Diagnosis present

## 2019-11-07 DIAGNOSIS — I351 Nonrheumatic aortic (valve) insufficiency: Secondary | ICD-10-CM | POA: Diagnosis not present

## 2019-11-07 DIAGNOSIS — I499 Cardiac arrhythmia, unspecified: Secondary | ICD-10-CM | POA: Diagnosis not present

## 2019-11-07 DIAGNOSIS — A419 Sepsis, unspecified organism: Secondary | ICD-10-CM | POA: Diagnosis not present

## 2019-11-07 DIAGNOSIS — I330 Acute and subacute infective endocarditis: Secondary | ICD-10-CM | POA: Diagnosis present

## 2019-11-07 DIAGNOSIS — R791 Abnormal coagulation profile: Secondary | ICD-10-CM | POA: Diagnosis not present

## 2019-11-07 DIAGNOSIS — I358 Other nonrheumatic aortic valve disorders: Secondary | ICD-10-CM | POA: Diagnosis not present

## 2019-11-07 DIAGNOSIS — R0602 Shortness of breath: Secondary | ICD-10-CM | POA: Diagnosis not present

## 2019-11-07 DIAGNOSIS — H9190 Unspecified hearing loss, unspecified ear: Secondary | ICD-10-CM | POA: Diagnosis present

## 2019-11-07 DIAGNOSIS — J9690 Respiratory failure, unspecified, unspecified whether with hypoxia or hypercapnia: Secondary | ICD-10-CM | POA: Diagnosis not present

## 2019-11-07 DIAGNOSIS — E860 Dehydration: Secondary | ICD-10-CM | POA: Diagnosis present

## 2019-11-07 DIAGNOSIS — E43 Unspecified severe protein-calorie malnutrition: Secondary | ICD-10-CM | POA: Diagnosis present

## 2019-11-07 DIAGNOSIS — G9340 Encephalopathy, unspecified: Secondary | ICD-10-CM | POA: Diagnosis not present

## 2019-11-07 DIAGNOSIS — G9341 Metabolic encephalopathy: Secondary | ICD-10-CM | POA: Diagnosis present

## 2019-11-07 DIAGNOSIS — Y95 Nosocomial condition: Secondary | ICD-10-CM | POA: Diagnosis present

## 2019-11-07 DIAGNOSIS — Z880 Allergy status to penicillin: Secondary | ICD-10-CM

## 2019-11-07 DIAGNOSIS — K5900 Constipation, unspecified: Secondary | ICD-10-CM | POA: Diagnosis not present

## 2019-11-07 DIAGNOSIS — N183 Chronic kidney disease, stage 3 unspecified: Secondary | ICD-10-CM | POA: Diagnosis present

## 2019-11-07 DIAGNOSIS — I4891 Unspecified atrial fibrillation: Secondary | ICD-10-CM | POA: Diagnosis present

## 2019-11-07 DIAGNOSIS — J90 Pleural effusion, not elsewhere classified: Secondary | ICD-10-CM | POA: Diagnosis not present

## 2019-11-07 DIAGNOSIS — F03918 Unspecified dementia, unspecified severity, with other behavioral disturbance: Secondary | ICD-10-CM | POA: Diagnosis present

## 2019-11-07 DIAGNOSIS — F0281 Dementia in other diseases classified elsewhere with behavioral disturbance: Secondary | ICD-10-CM | POA: Diagnosis not present

## 2019-11-07 DIAGNOSIS — I25700 Atherosclerosis of coronary artery bypass graft(s), unspecified, with unstable angina pectoris: Secondary | ICD-10-CM | POA: Diagnosis not present

## 2019-11-07 DIAGNOSIS — J1289 Other viral pneumonia: Secondary | ICD-10-CM | POA: Diagnosis not present

## 2019-11-07 DIAGNOSIS — Z0189 Encounter for other specified special examinations: Secondary | ICD-10-CM

## 2019-11-07 DIAGNOSIS — R64 Cachexia: Secondary | ICD-10-CM | POA: Diagnosis present

## 2019-11-07 HISTORY — DX: Pneumonia, unspecified organism: J18.9

## 2019-11-07 HISTORY — DX: Unspecified dementia, unspecified severity, without behavioral disturbance, psychotic disturbance, mood disturbance, and anxiety: F03.90

## 2019-11-07 HISTORY — DX: Unspecified osteoarthritis, unspecified site: M19.90

## 2019-11-07 LAB — COMPREHENSIVE METABOLIC PANEL
ALT: 16 U/L (ref 0–44)
AST: 15 U/L (ref 15–41)
Albumin: 3.2 g/dL — ABNORMAL LOW (ref 3.5–5.0)
Alkaline Phosphatase: 66 U/L (ref 38–126)
Anion gap: 15 (ref 5–15)
BUN: 48 mg/dL — ABNORMAL HIGH (ref 8–23)
CO2: 19 mmol/L — ABNORMAL LOW (ref 22–32)
Calcium: 9 mg/dL (ref 8.9–10.3)
Chloride: 114 mmol/L — ABNORMAL HIGH (ref 98–111)
Creatinine, Ser: 1.33 mg/dL — ABNORMAL HIGH (ref 0.44–1.00)
GFR calc Af Amer: 39 mL/min — ABNORMAL LOW (ref >60)
GFR calc non Af Amer: 33 mL/min — ABNORMAL LOW (ref >60)
Glucose, Bld: 132 mg/dL — ABNORMAL HIGH (ref 70–99)
Potassium: 3.7 mmol/L (ref 3.5–5.1)
Sodium: 148 mmol/L — ABNORMAL HIGH (ref 135–145)
Total Bilirubin: 1.4 mg/dL — ABNORMAL HIGH (ref 0.3–1.2)
Total Protein: 7.2 g/dL (ref 6.5–8.1)

## 2019-11-07 LAB — CBC WITH DIFFERENTIAL/PLATELET
Abs Immature Granulocytes: 0.15 10*3/uL — ABNORMAL HIGH (ref 0.00–0.07)
Basophils Absolute: 0 10*3/uL (ref 0.0–0.1)
Basophils Relative: 0 %
Eosinophils Absolute: 0 10*3/uL (ref 0.0–0.5)
Eosinophils Relative: 0 %
HCT: 41.3 % (ref 36.0–46.0)
Hemoglobin: 12.9 g/dL (ref 12.0–15.0)
Immature Granulocytes: 1 %
Lymphocytes Relative: 3 %
Lymphs Abs: 0.7 10*3/uL (ref 0.7–4.0)
MCH: 29.3 pg (ref 26.0–34.0)
MCHC: 31.2 g/dL (ref 30.0–36.0)
MCV: 93.7 fL (ref 80.0–100.0)
Monocytes Absolute: 1 10*3/uL (ref 0.1–1.0)
Monocytes Relative: 5 %
Neutro Abs: 19.7 10*3/uL — ABNORMAL HIGH (ref 1.7–7.7)
Neutrophils Relative %: 91 %
Platelets: 131 10*3/uL — ABNORMAL LOW (ref 150–400)
RBC: 4.41 MIL/uL (ref 3.87–5.11)
RDW: 14.3 % (ref 11.5–15.5)
WBC: 21.6 10*3/uL — ABNORMAL HIGH (ref 4.0–10.5)
nRBC: 0 % (ref 0.0–0.2)

## 2019-11-07 LAB — BASIC METABOLIC PANEL
Anion gap: 10 (ref 5–15)
BUN: 40 mg/dL — ABNORMAL HIGH (ref 8–23)
CO2: 19 mmol/L — ABNORMAL LOW (ref 22–32)
Calcium: 7.8 mg/dL — ABNORMAL LOW (ref 8.9–10.3)
Chloride: 118 mmol/L — ABNORMAL HIGH (ref 98–111)
Creatinine, Ser: 1.13 mg/dL — ABNORMAL HIGH (ref 0.44–1.00)
GFR calc Af Amer: 47 mL/min — ABNORMAL LOW (ref >60)
GFR calc non Af Amer: 41 mL/min — ABNORMAL LOW (ref >60)
Glucose, Bld: 157 mg/dL — ABNORMAL HIGH (ref 70–99)
Potassium: 3.9 mmol/L (ref 3.5–5.1)
Sodium: 147 mmol/L — ABNORMAL HIGH (ref 135–145)

## 2019-11-07 LAB — D-DIMER, QUANTITATIVE: D-Dimer, Quant: >20 ug/mL-FEU — ABNORMAL HIGH (ref 0.00–0.50)

## 2019-11-07 LAB — PROCALCITONIN: Procalcitonin: 0.21 ng/mL

## 2019-11-07 LAB — URINALYSIS, ROUTINE W REFLEX MICROSCOPIC
Bilirubin Urine: NEGATIVE
Glucose, UA: NEGATIVE mg/dL
Hgb urine dipstick: NEGATIVE
Ketones, ur: NEGATIVE mg/dL
Leukocytes,Ua: NEGATIVE
Nitrite: NEGATIVE
Protein, ur: NEGATIVE mg/dL
Specific Gravity, Urine: 1.017 (ref 1.005–1.030)
pH: 5 (ref 5.0–8.0)

## 2019-11-07 LAB — LACTIC ACID, PLASMA
Lactic Acid, Venous: 4.2 mmol/L (ref 0.5–1.9)
Lactic Acid, Venous: 4 mmol/L (ref 0.5–1.9)

## 2019-11-07 LAB — LACTATE DEHYDROGENASE: LDH: 177 U/L (ref 98–192)

## 2019-11-07 LAB — PROTIME-INR
INR: 1.5 — ABNORMAL HIGH (ref 0.8–1.2)
Prothrombin Time: 18.3 seconds — ABNORMAL HIGH (ref 11.4–15.2)

## 2019-11-07 LAB — FERRITIN: Ferritin: 539 ng/mL — ABNORMAL HIGH (ref 11–307)

## 2019-11-07 LAB — POC SARS CORONAVIRUS 2 AG -  ED: SARS Coronavirus 2 Ag: POSITIVE — AB

## 2019-11-07 LAB — TRIGLYCERIDES: Triglycerides: 131 mg/dL (ref <150)

## 2019-11-07 LAB — C-REACTIVE PROTEIN: CRP: 20.1 mg/dL — ABNORMAL HIGH (ref <1.0)

## 2019-11-07 LAB — APTT: aPTT: 30 seconds (ref 24–36)

## 2019-11-07 LAB — FIBRINOGEN: Fibrinogen: 460 mg/dL (ref 210–475)

## 2019-11-07 MED ORDER — TIZANIDINE HCL 2 MG PO TABS
2.0000 mg | ORAL_TABLET | Freq: Once | ORAL | Status: DC
  Filled 2019-11-07: qty 1

## 2019-11-07 MED ORDER — DEXTROSE 5 % IV SOLN
0.5000 g | Freq: Three times a day (TID) | INTRAVENOUS | Status: DC
  Administered 2019-11-08 (×2): 0.5 g via INTRAVENOUS
  Filled 2019-11-07 (×5): qty 0.5

## 2019-11-07 MED ORDER — MEMANTINE HCL ER 14 MG PO CP24
14.0000 mg | ORAL_CAPSULE | Freq: Every day | ORAL | Status: DC
  Administered 2019-11-08: 14 mg via ORAL
  Filled 2019-11-07 (×3): qty 1

## 2019-11-07 MED ORDER — METOPROLOL TARTRATE 5 MG/5ML IV SOLN
5.0000 mg | Freq: Once | INTRAVENOUS | Status: AC
  Administered 2019-11-07: 5 mg via INTRAVENOUS
  Filled 2019-11-07: qty 5

## 2019-11-07 MED ORDER — MEMANTINE HCL ER 28 MG PO CP24: 28.0000 mg | ORAL_CAPSULE | Freq: Every day | ORAL | Status: DC

## 2019-11-07 MED ORDER — MIRABEGRON ER 50 MG PO TB24: 50.0000 mg | ORAL_TABLET | Freq: Every day | ORAL | Status: DC

## 2019-11-07 MED ORDER — SODIUM CHLORIDE 0.9 % IV SOLN
1000.0000 mL | INTRAVENOUS | Status: DC
  Administered 2019-11-07: 1000 mL via INTRAVENOUS

## 2019-11-07 MED ORDER — HEPARIN SODIUM (PORCINE) 5000 UNIT/ML IJ SOLN
5000.0000 [IU] | Freq: Three times a day (TID) | INTRAMUSCULAR | Status: DC
  Administered 2019-11-07 – 2019-11-08 (×2): 5000 [IU] via SUBCUTANEOUS
  Filled 2019-11-07 (×2): qty 1

## 2019-11-07 MED ORDER — AMLODIPINE BESYLATE 5 MG PO TABS: 5.0000 mg | ORAL_TABLET | Freq: Every day | ORAL | Status: DC

## 2019-11-07 MED ORDER — VANCOMYCIN HCL IN DEXTROSE 1-5 GM/200ML-% IV SOLN
1000.0000 mg | Freq: Once | INTRAVENOUS | Status: AC
  Administered 2019-11-07: 1000 mg via INTRAVENOUS
  Filled 2019-11-07: qty 200

## 2019-11-07 MED ORDER — SODIUM CHLORIDE 0.9 % IV BOLUS (SEPSIS)
1000.0000 mL | Freq: Once | INTRAVENOUS | Status: AC
  Administered 2019-11-07: 1000 mL via INTRAVENOUS

## 2019-11-07 MED ORDER — SODIUM CHLORIDE 0.9 % IV BOLUS (SEPSIS)
500.0000 mL | Freq: Once | INTRAVENOUS | Status: AC
  Administered 2019-11-07: 20:00:00 500 mL via INTRAVENOUS

## 2019-11-07 MED ORDER — SODIUM CHLORIDE 0.9 % IV SOLN: INTRAVENOUS | Status: DC

## 2019-11-07 MED ORDER — ATENOLOL 25 MG PO TABS
25.0000 mg | ORAL_TABLET | Freq: Every day | ORAL | Status: DC
  Administered 2019-11-09: 25 mg via ORAL
  Filled 2019-11-07 (×2): qty 1

## 2019-11-07 MED ORDER — VANCOMYCIN HCL 500 MG/100ML IV SOLN
500.0000 mg | INTRAVENOUS | Status: DC
  Administered 2019-11-09 – 2019-11-13 (×3): 500 mg via INTRAVENOUS
  Filled 2019-11-07 (×3): qty 100

## 2019-11-07 MED ORDER — ISOSORBIDE MONONITRATE ER 30 MG PO TB24
30.0000 mg | ORAL_TABLET | Freq: Every day | ORAL | Status: DC
  Filled 2019-11-07 (×2): qty 1

## 2019-11-07 MED ORDER — NITROGLYCERIN 0.4 MG SL SUBL: 0.4000 mg | SUBLINGUAL_TABLET | SUBLINGUAL | Status: DC | PRN

## 2019-11-07 MED ORDER — DONEPEZIL HCL 10 MG PO TABS
10.0000 mg | ORAL_TABLET | Freq: Every day | ORAL | Status: DC
  Administered 2019-11-08: 10 mg via ORAL
  Filled 2019-11-07 (×2): qty 1

## 2019-11-07 MED ORDER — DEXAMETHASONE SODIUM PHOSPHATE 10 MG/ML IJ SOLN
6.0000 mg | INTRAMUSCULAR | Status: DC
  Administered 2019-11-07 – 2019-11-14 (×8): 6 mg via INTRAVENOUS
  Filled 2019-11-07 (×8): qty 1

## 2019-11-07 MED ORDER — MIRABEGRON ER 25 MG PO TB24
25.0000 mg | ORAL_TABLET | Freq: Every day | ORAL | Status: DC
  Administered 2019-11-08: 25 mg via ORAL
  Filled 2019-11-07 (×3): qty 1

## 2019-11-07 MED ORDER — SODIUM CHLORIDE 0.9 % IV SOLN
2.0000 g | Freq: Once | INTRAVENOUS | Status: AC
  Administered 2019-11-07: 2 g via INTRAVENOUS
  Filled 2019-11-07: qty 2

## 2019-11-07 MED ORDER — AZTREONAM 1 G IJ SOLR
500.0000 mg | Freq: Three times a day (TID) | INTRAMUSCULAR | Status: DC
  Filled 2019-11-07 (×5): qty 0.5

## 2019-11-07 MED ORDER — ASPIRIN 81 MG PO CHEW
81.0000 mg | CHEWABLE_TABLET | Freq: Every day | ORAL | Status: DC
  Administered 2019-11-08 – 2019-11-09 (×2): 81 mg via ORAL
  Filled 2019-11-07 (×2): qty 1

## 2019-11-07 MED ORDER — DOCUSATE SODIUM 100 MG PO CAPS
100.0000 mg | ORAL_CAPSULE | Freq: Two times a day (BID) | ORAL | Status: DC
  Administered 2019-11-08 – 2019-11-09 (×3): 100 mg via ORAL
  Filled 2019-11-07 (×3): qty 1

## 2019-11-07 NOTE — Telephone Encounter (Signed)
Refer to tele call from 12/29- this encounter will be closed.

## 2019-11-07 NOTE — ED Triage Notes (Signed)
EMS called out due to breathing difficulty. Decreased mental status and breathing 40 times per minute. Sats 80% on RA. With EMS, HR 178. Pt is not alert or oriented. BP with EMS 92/48. Pt on nonrebreather with EMS, but never able to get an O2 sat.

## 2019-11-07 NOTE — H&P (Signed)
TRH H&P    Patient Demographics:    Sue Schaefer, is a 85 y.o. female  MRN: [id number]  DOB - November 07, 1920  Admit Date - 11/06/2019  Referring MD/NP/PA: Dr. Sabra Heck  Outpatient Primary MD for the patient is Dettinger, Sue Kaufmann, MD  Patient coming from: SNF  Chief complaint- Dyspnea   HPI:    Sue Schaefer  is a 85 y.o. female, with history of dementia, hypertension, vitamin D deficiency, and more presents to the ED today with difficulty breathing.  Patient is nonverbal at the time of my exam so history is collected from the medical record.  Patient was apparently tested positive for Covid 19 3 weeks ago.  From what I gather, she was asymptomatic at that time.  She was already on Breo and dexamethasone for lung disease.  Apparently at baseline patient is talkative and interactive, but today she started to have altered mental status, shortness of breath, and hypoxia.  When patient arrived to the ER she was ill-appearing with hypoxia and tachycardia.  Sepsis was suspected and broad-spectrum antibiotics were started with aztreonam and vancomycin.  Patient's family was contacted by Dr. Sabra Heck for CODE STATUS discussion and patient was made DNR.  Patient has been nonverbal during her entire visit in the ER.  ER provider reported that she would attempt to help with position changes, but was otherwise unresponsive.  At the time of my exam patient is still nonverbal, will not open her eyes to pain or voice, but does withdraw from pain.  ED course Temperature 100, pulse 139, respiratory rate 23, blood pressure 111/55 White blood cell count 21.6 CHEM panel: Sodium 148, creatinine 1.33-baseline 1.16 LDH 177, triglycerides 131, ferritin 539, CRP 20.1, lactic acid 4.2, pro-Cal 0.21, fibrinogen 460 Blood cultures drawn Urine culture collected Chest x-ray shows left lower lobe opacity EKG shows a heart rate of 145 and A. fib with  RVR QTC is 441    Review of systems:    Review of systems cannot be obtained due to patient's altered mental status and dementia    Past History of the following :    Past Medical History:  Diagnosis Date  . Anemia   . Hypertension   . Memory loss   . Osteoporosis   . URI (upper respiratory infection)   . Vitamin D deficiency       History reviewed. No pertinent surgical history.    Social History:      Social History   Tobacco Use  . Smoking status: Never Smoker  . Smokeless tobacco: Never Used  Substance Use Topics  . Alcohol use: No       Family History :    No family history on file. Family history cannot be reviewed due to patient's altered mental status   Home Medications:   Prior to Admission medications   Medication Sig Start Date End Date Taking? Authorizing Provider  amLODipine (NORVASC) 5 MG tablet Take 1 tablet (5 mg total) by mouth daily. 08/25/15   Claretta Fraise, MD  Ascorbic Acid (VITAMIN C) 500  MG CHEW Chew 1 tablet (500 mg total) by mouth daily. 10/26/19   Dettinger, Sue Kaufmann, MD  aspirin 81 MG chewable tablet Chew 81 mg by mouth daily.      [provider]  atenolol (TENORMIN) 25 MG tablet Take 25 mg by mouth daily. 10/23/19   [provider]  dexamethasone (DECADRON) 4 MG tablet Take 1 tablet (4 mg total) by mouth daily. 10/26/19   Dettinger, Sue Kaufmann, MD  docusate sodium (COLACE) 100 MG capsule Take 1 capsule (100 mg total) by mouth 2 (two) times daily. To soften stool 10/29/16   Claretta Fraise, MD  donepezil (ARICEPT) 10 MG tablet Take 1 tablet (10 mg total) by mouth at bedtime as needed. 08/25/15   Claretta Fraise, MD  fluticasone furoate-vilanterol (BREO ELLIPTA) 200-25 MCG/INH AEPB Inhale 1 puff into the lungs daily. 02/23/17   Claretta Fraise, MD  furosemide (LASIX) 40 MG tablet TAKE 1 TABLET DAILY 08/25/15   Claretta Fraise, MD  isosorbide mononitrate (IMDUR) 30 MG 24 hr tablet Take 1 tablet (30 mg total) by mouth daily.  08/25/15   Claretta Fraise, MD  Lutein 6 MG CAPS Take by mouth daily.      [provider]  Misc. Devices (TRANSPORT CHAIR) MISC 1 each by Does not apply route daily. 02/06/16   Hassell Done, Mary-Margaret, FNP  MYRBETRIQ 50 MG TB24 tablet TAKE ONE TABLET AT BEDTIME 02/04/16   Claretta Fraise, MD  NAMENDA XR 28 MG CP24 24 hr capsule TAKE (1) CAPSULE DAILY 01/19/16   Claretta Fraise, MD  nitrofurantoin, macrocrystal-monohydrate, (MACROBID) 100 MG capsule Take 100 mg by mouth 2 (two) times daily.    [provider]  nitroGLYCERIN (NITROSTAT) 0.4 MG SL tablet Place 0.4 mg under the tongue every 5 (five) minutes as needed.      [provider]  Polyethyl Glycol-Propyl Glycol (SYSTANE) 0.4-0.3 % SOLN Apply 1 drop to eye 4 (four) times daily.    [provider]  potassium chloride SA (K-DUR,KLOR-CON) 20 MEQ tablet TAKE 1 TABLET DAILY 06/12/15   Benjamin Stain, Tiffany A, PA-C  prednisoLONE acetate (PRED FORTE) 1 % ophthalmic suspension Place 1 drop into the right eye 2 (two) times daily.    [provider]  tiZANidine (ZANAFLEX) 2 MG tablet TAKE 1 TABLET AT BEDTIME AS NEEDED FOR MUSCLE SPASMS 11/02/17   Dettinger, Sue Kaufmann, MD  Urine Specimen Collection KIT Urine Specimen needed 08/08/17   Dettinger, Sue Kaufmann, MD  zinc gluconate 50 MG tablet Take 1 tablet (50 mg total) by mouth daily. 10/26/19   Dettinger, Sue Kaufmann, MD     Allergies:     Allergies  Allergen Reactions  . Erythromycin Base Other (See Comments)    mycins"- "burned all the way through"  . Keflex [Cephalexin] Itching  . Other Itching    Mycins made patient itch  . Penicillins Cross Reactors     Patient is allergic to ALL MYCINS DRUGS!!!  . Sulfamethoxazole Rash    unknown     Physical Exam:   Vitals  Blood pressure (!) 112/52, pulse (!) 139, temperature 100 F (37.8 C), temperature source Rectal, resp. rate 20, height '5\' 2"'$  (1.575 m), weight 43.1 kg, SpO2 100 %.  1.  General: Lying supine in bed with  nonrebreather mask on  2. Psychiatric: Could not be assessed due to patient's nonverbal state  3. Neurologic: Cannot be assessed as patient cannot participate in exam.  Does move both upper extremities to withdraw from pain  4. HEENMT:  Head is atraumatic normocephalic pupils are reactive trachea is midline  5. Respiratory : Coarse breath sounds and rhonchi in the bilateral lung fields  6. Cardiovascular : Heart rate is tachycardic and irregularly irregular  7. Gastrointestinal:  Abdomen is soft nondistended nontender to palpation  8. Skin:  Skin is warm and dry without acute lesions on limited skin exam  9.Musculoskeletal:  Bruising and tearing with bandage over the left shin    Data Review:    CBC Recent Labs  Lab 11/06/2019 1645  WBC 21.6*  HGB 12.9  HCT 41.3  PLT 131*  MCV 93.7  MCH 29.3  MCHC 31.2  RDW 14.3  LYMPHSABS 0.7  MONOABS 1.0  EOSABS 0.0  BASOSABS 0.0   ------------------------------------------------------------------------------------------------------------------  Results for orders placed or performed during the hospital encounter of 10/26/2019 (from the past 48 hour(s))  Blood Culture (routine x 2)     Status: None (Preliminary result)   Collection Time: 10/20/2019  4:18 PM   Specimen: Right Antecubital; Blood  Result Value Ref Range   Specimen Description RIGHT ANTECUBITAL    Special Requests      BOTTLES DRAWN AEROBIC AND ANAEROBIC Blood Culture adequate volume Performed at [facility], [address], [place], [place] [address]    Culture PENDING    Report Status PENDING   Urinalysis, Routine w reflex microscopic     Status: Abnormal   Collection Time: 11/03/2019  4:40 PM  Result Value Ref Range   Color, Urine YELLOW YELLOW   APPearance HAZY (A) CLEAR   Specific Gravity, Urine 1.017 1.005 - 1.030   pH 5.0 5.0 - 8.0   Glucose, UA NEGATIVE NEGATIVE mg/dL   Hgb urine dipstick NEGATIVE NEGATIVE   Bilirubin Urine NEGATIVE NEGATIVE    Ketones, ur NEGATIVE NEGATIVE mg/dL   Protein, ur NEGATIVE NEGATIVE mg/dL   Nitrite NEGATIVE NEGATIVE   Leukocytes,Ua NEGATIVE NEGATIVE    Comment: Performed at [facility], [address], [place], [place] [address]  Lactic acid, plasma     Status: Abnormal   Collection Time: 10/19/2019  4:45 PM  Result Value Ref Range   Lactic Acid, Venous 4.2 (HH) 0.5 - 1.9 mmol/L    Comment: CRITICAL RESULT CALLED TO, READ BACK BY AND VERIFIED WITH: BAYNES,B ON 10/21/2019 AT 1730 BY LOY,C Performed at [facility], [address], [place], [place] [address]   Comprehensive metabolic panel     Status: Abnormal   Collection Time: 10/30/2019  4:45 PM  Result Value Ref Range   Sodium 148 (H) 135 - 145 mmol/L   Potassium 3.7 3.5 - 5.1 mmol/L   Chloride 114 (H) 98 - 111 mmol/L   CO2 19 (L) 22 - 32 mmol/L   Glucose, Bld 132 (H) 70 - 99 mg/dL   BUN 48 (H) 8 - 23 mg/dL   Creatinine, Ser 1.33 (H) 0.44 - 1.00 mg/dL   Calcium 9.0 8.9 - 10.3 mg/dL   Total Protein 7.2 6.5 - 8.1 g/dL   Albumin 3.2 (L) 3.5 - 5.0 g/dL   AST 15 15 - 41 U/L   ALT 16 0 - 44 U/L   Alkaline Phosphatase 66 38 - 126 U/L   Total Bilirubin 1.4 (H) 0.3 - 1.2 mg/dL   GFR calc non Af Amer 33 (L) >60 mL/min   GFR calc Af Amer 39 (L) >60 mL/min   Anion gap 15 5 - 15    Comment: Performed at [facility], [address], [place], [place] [address]  CBC WITH DIFFERENTIAL     Status: Abnormal   Collection Time: 10/11/2019  4:45 PM  Result Value Ref Range   WBC 21.6 (H) 4.0 - 10.5 K/uL   RBC 4.41 3.87 - 5.11 MIL/uL   Hemoglobin 12.9 12.0 - 15.0 g/dL   HCT 41.3 36.0 - 46.0 %   MCV 93.7 80.0 - 100.0 fL   MCH 29.3 26.0 - 34.0 pg   MCHC 31.2 30.0 - 36.0 g/dL   RDW 14.3 11.5 - 15.5 %   Platelets 131 (L) 150 - 400 K/uL   nRBC 0.0 0.0 - 0.2 %   Neutrophils Relative % 91 %   Neutro Abs 19.7 (H) 1.7 - 7.7 K/uL   Lymphocytes Relative 3 %   Lymphs Abs 0.7 0.7 - 4.0 K/uL   Monocytes Relative 5 %   Monocytes Absolute 1.0 0.1 - 1.0 K/uL   Eosinophils  Relative 0 %   Eosinophils Absolute 0.0 0.0 - 0.5 K/uL   Basophils Relative 0 %   Basophils Absolute 0.0 0.0 - 0.1 K/uL   Immature Granulocytes 1 %   Abs Immature Granulocytes 0.15 (H) 0.00 - 0.07 K/uL    Comment: Performed at [facility], [address] [place], [place] [address]  APTT     Status: None   Collection Time: 10/30/2019  4:45 PM  Result Value Ref Range   aPTT 30 24 - 36 seconds    Comment: Performed at [facility], [address] [place], [place] [address]  Protime-INR     Status: Abnormal   Collection Time: 10/11/2019  4:45 PM  Result Value Ref Range   Prothrombin Time 18.3 (H) 11.4 - 15.2 seconds   INR 1.5 (H) 0.8 - 1.2    Comment: (NOTE) INR goal varies based on device and disease states. Performed at [facility], [address], [place], [place] [address]   Blood Culture (routine x 2)     Status: None (Preliminary result)   Collection Time: 10/31/2019  4:45 PM   Specimen: Left Antecubital; Blood  Result Value Ref Range   Specimen Description LEFT ANTECUBITAL    Special Requests      BOTTLES DRAWN AEROBIC AND ANAEROBIC Blood Culture adequate volume Performed at [facility], [address] [place], [place] [address]    Culture PENDING    Report Status PENDING   D-dimer, quantitative     Status: Abnormal   Collection Time: 10/16/2019  4:45 PM  Result Value Ref Range   D-Dimer, Quant >20.00 (H) 0.00 - 0.50 ug/mL-FEU    Comment: RESULT REPEATED AND VERIFIED (NOTE) At the manufacturer cut-off of 0.50 ug/mL FEU, this assay has been documented to exclude PE with a sensitivity and negative predictive value of 97 to 99%.  At this time, this assay has not been approved by the FDA to exclude DVT/VTE. Results should be correlated with clinical presentation. Performed at [facility], [address] [place], [place] [address]   Procalcitonin     Status: None   Collection Time: 11/08/2019  4:45 PM  Result Value Ref Range   Procalcitonin 0.21 ng/mL    Comment:          Interpretation: PCT (Procalcitonin) <= 0.5 ng/mL: Systemic infection (sepsis) is not likely. Local bacterial infection is possible. (NOTE)       Sepsis PCT Algorithm           Lower Respiratory Tract  Infection PCT Algorithm    ----------------------------     ----------------------------         PCT < 0.25 ng/mL                PCT < 0.10 ng/mL         Strongly encourage             Strongly discourage   discontinuation of antibiotics    initiation of antibiotics    ----------------------------     -----------------------------       PCT 0.25 - 0.50 ng/mL            PCT 0.10 - 0.25 ng/mL               OR       >80% decrease in PCT            Discourage initiation of                                            antibiotics      Encourage discontinuation           of antibiotics    ----------------------------     -----------------------------         PCT >= 0.50 ng/mL              PCT 0.26 - 0.50 ng/mL               AND        <80% decrease in PCT             Encourage initiation of                                             antibiotics       Encourage continuation           of antibiotics    ----------------------------     -----------------------------        PCT >= 0.50 ng/mL                  PCT > 0.50 ng/mL               AND         increase in PCT                  Strongly encourage                                      initiation of antibiotics    Strongly encourage escalation           of antibiotics                                     -----------------------------                                           PCT <= 0.25 ng/mL  OR                                        > 80% decrease in PCT                                     Discontinue / Do not initiate                                             antibiotics Performed at [facility], [address] [place], [place] [address]   Lactate  dehydrogenase     Status: None   Collection Time: 10/29/2019  4:45 PM  Result Value Ref Range   LDH 177 98 - 192 U/L    Comment: Performed at [facility], [address] [place], [place] [address]  Ferritin     Status: Abnormal   Collection Time: 11/02/2019  4:45 PM  Result Value Ref Range   Ferritin 539 (H) 11 - 307 ng/mL    Comment: Performed at [facility], [address], [place], [place] [address]  Fibrinogen     Status: None   Collection Time: 10/22/2019  4:45 PM  Result Value Ref Range   Fibrinogen 460 210 - 475 mg/dL    Comment: Performed at [facility], [address], [place], [place] [address]  C-reactive protein     Status: Abnormal   Collection Time: 10/31/2019  4:45 PM  Result Value Ref Range   CRP 20.1 (H) <1.0 mg/dL    Comment: Performed at [facility], [address], [place], [place] [address]  Triglycerides     Status: None   Collection Time: 10/23/2019  4:46 PM  Result Value Ref Range   Triglycerides 131 <150 mg/dL    Comment: Performed at [facility], [address], [place],  [address]  POC SARS Coronavirus 2 Ag-ED - Nasal Swab (BD Veritor Kit)     Status: Abnormal   Collection Time: 10/20/2019  5:03 PM  Result Value Ref Range   SARS Coronavirus 2 Ag POSITIVE (A) NEGATIVE    Comment: (NOTE) SARS-CoV-2 antigen PRESENT. Positive results indicate the presence of viral antigens, but clinical correlation with patient history and other diagnostic information is necessary to determine patient infection status.  Positive results do not rule out bacterial infection or co-infection  with other viruses. False positive results are rare but can occur, and confirmatory RT-PCR testing may be appropriate in some circumstances. The expected result is Negative. Fact Sheet for Patients: PodPark.tn Fact Sheet for Providers: GiftContent.is  This test is not yet approved or cleared by the [place] FDA and  has been  authorized for detection and/or diagnosis of SARS-CoV-2 by FDA under an Emergency Use Authorization (EUA).  This EUA will remain in effect (meaning this test can be used) for the duration of  the COVID-19 declaration under Section 564(b)(1) of the Act, 21 U.S.C. section 360bbb-3(b)(1), unless the a uthorization is terminated or revoked sooner.     Chemistries  Recent Labs  Lab 10/12/2019 1645  NA 148*  K 3.7  CL 114*  CO2 19*  GLUCOSE 132*  BUN 48*  CREATININE 1.33*  CALCIUM 9.0  AST 15  ALT  16  ALKPHOS 66  BILITOT 1.4*   ------------------------------------------------------------------------------------------------------------------  ------------------------------------------------------------------------------------------------------------------ GFR: Estimated Creatinine Clearance: 16.5 mL/min (A) (by C-G formula based on SCr of 1.33 mg/dL (H)). Liver Function Tests: Recent Labs  Lab 10/30/2019 1645  AST 15  ALT 16  ALKPHOS 66  BILITOT 1.4*  PROT 7.2  ALBUMIN 3.2*   No results for input(s): LIPASE, AMYLASE in the last 168 hours. No results for input(s): AMMONIA in the last 168 hours. Coagulation Profile: Recent Labs  Lab 10/11/2019 1645  INR 1.5*   Cardiac Enzymes: No results for input(s): CKTOTAL, CKMB, CKMBINDEX, TROPONINI in the last 168 hours. BNP (last 3 results) No results for input(s): PROBNP in the last 8760 hours. HbA1C: No results for input(s): HGBA1C in the last 72 hours. CBG: No results for input(s): GLUCAP in the last 168 hours. Lipid Profile: Recent Labs    10/27/2019 1646  TRIG 131   Thyroid Function Tests: No results for input(s): TSH, T4TOTAL, FREET4, T3FREE, THYROIDAB in the last 72 hours. Anemia Panel: Recent Labs    10/12/2019 1645  FERRITIN 539*    --------------------------------------------------------------------------------------------------------------- Urine analysis:    Component Value Date/Time   COLORURINE YELLOW  11/02/2019 1640   APPEARANCEUR HAZY (A) 10/12/2019 1640   APPEARANCEUR Clear 07/14/2018 1352   LABSPEC 1.017 10/30/2019 1640   PHURINE 5.0 10/20/2019 1640   GLUCOSEU NEGATIVE 11/06/2019 1640   HGBUR NEGATIVE 11/05/2019 1640   BILIRUBINUR NEGATIVE 10/19/2019 1640   BILIRUBINUR Negative 07/14/2018 1352   KETONESUR NEGATIVE 10/20/2019 1640   PROTEINUR NEGATIVE 11/03/2019 1640   UROBILINOGEN negative 01/06/2016 1638   NITRITE NEGATIVE 11/04/2019 1640   LEUKOCYTESUR NEGATIVE 11/05/2019 1640      Imaging Results:    DG Chest Port 1 View  Result Date: 10/19/2019 CLINICAL DATA:  Respiratory failure EXAM: PORTABLE CHEST 1 VIEW COMPARISON:  08/24/2017 FINDINGS: Increased interstitial markings. Mild left basilar/left lower lobe opacities. No pleural effusion or pneumothorax. The heart is normal in size.  Thoracic aortic atherosclerosis. Bilateral rib fracture deformities, chronic. IMPRESSION: Left lower lobe opacity with increased interstitial markings, raising concern for possible pneumonia. Electronically Signed   By: Julian Hy M.D.   On: 10/25/2019 16:59    My personal review of EKG: Rhythm Afib, Rate 145/min, QTc 441 ,no Acute ST changes   Assessment & Plan:    Active Problems:   COVID-19   1. Acute hypoxic respiratory failure 1. Likely secondary to Covid infection and likely superimposed bacterial infection 2. Continue oxygen supplementation 3. Continue broad-spectrum antibiotics 4. Continue Decadron 5. Continue inhalers if patient is able to participate 6. Monitor pulse ox with vital signs 2. Covid infection 1. Patient has been Covid positive now for 3 weeks 2. LDH 177, triglycerides 131, ferritin 539, CRP 20.1, lactic acid 4.2, proCal 0.21, fibrinogen 460 3. Likely superimposed bacterial infection is the primary problem at this point 4. Continue Decadron 5. Continue broad-spectrum antibiotics 6. Covid precautions 7. Continue to monitor 3. Sepsis secondary to  community acquired pneumonia 1. Chest x-ray shows left lower lobe opacity 2. Broad-spectrum antibiotics with aztreonam and vancomycin 3. 1 L normal saline given in ED 4. Continue IV fluids 5. Trend lactic acid 6. Continue to monitor 4. Lactic acidosis 1. Large fluid boluses avoided in the setting of COVID-19 2. 1 L normal saline given in ED 3. IV fluids continued 4. Trend lactic acid 5. A. fib with RVR 1. Stress response in the setting of COVID-19 and pneumonia 2. One-time dose of metoprolol 5 mg injection  after adequate fluid hydration to maintain blood pressure 6. Hypernatremia 1. Hypovolemic hypernatremia 2. Fluids as above 3. Repeat BMP at 10:00 tonight 4. Hold fluids if sodium is over correcting 7. Acute metabolic encephalopathy 1. Likely due to hypoxia and sepsis 2. Continue treatments as above 8. Protein calorie malnutrition 1. Likely due to poor p.o. intake in the setting of 3 weeks Covid 19 2. When patient is able to tolerate p.o. intake encourage nutrient dense choices 9. Elevated T bili 1. With morning CMP hold hepatotoxic agents when possible   DVT Prophylaxis-   Heparin- SCDs   AM Labs Ordered, also please review Full Orders  Family Communication: I have personally called to speak with daughter, Lynn Ito. She agrees with DNR status, as she does not want to do anything that would be harmful to patient, but they are not interested in comfort care only. Family is interested in coming to visit if it "doesn't look like mom will pull through this."  Code Status:  DNR  Admission status: Inpatient: Based on patients clinical presentation and evaluation of above clinical data, I have made determination that patient meets Inpatient criteria at this time.  Time spent in minutes : [email or web address]

## 2019-11-07 NOTE — Progress Notes (Signed)
Pharmacy Antibiotic Note  Sue Schaefer is a 85 y.o. female admitted on 11/06/2019 with pneumonia.  Pharmacy has been consulted for aztreonam and vanomycin dosing.  Presenting with difficulty breathing and AMS - found to be COVID +. WBC 21.6, PCT 0.21, CRP 20.1, LA 4.2. Scr 1.33 (CrCl 16 mL/min). CXR showing LLL opacity -concern for PNA.  Plan: Vancomycin 1 g IV once then 500 mg IV every 48 hours (estAUC 446)  Aztreonam 500 mg IV every 8 hours Monitor renal fx, cx results, clinical pic, and vanc levels as appropriate  Height: 5\' 2"  (157.5 cm) Weight: 95 lb (43.1 kg) IBW/kg (Calculated) : 50.1  Temp (24hrs), Avg:100 F (37.8 C), Min:100 F (37.8 C), Max:100 F (37.8 C)  Recent Labs  Lab 10/10/2019 1645  WBC 21.6*  CREATININE 1.33*  LATICACIDVEN 4.2*    Estimated Creatinine Clearance: 16.5 mL/min (A) (by C-G formula based on SCr of 1.33 mg/dL (H)).    Allergies  Allergen Reactions  . Erythromycin Base Other (See Comments)    mycins"- "burned all the way through"  . Keflex [Cephalexin] Itching  . Other Itching    Mycins made patient itch  . Penicillins Cross Reactors     Patient is allergic to ALL MYCINS DRUGS!!!  . Sulfamethoxazole Rash    unknown    Antimicrobials this admission: Vancomycin 12/30 >>  Aztreonam 12/30 >>   Dose adjustments this admission: N/A  Microbiology results: 12/30 BCx: sent 12/30 UCx: sent  12/30 COVID: sent   Thank you for allowing pharmacy to be a part of this patient's care.  Antonietta Jewel, PharmD, BCCCP Clinical Pharmacist  10/24/2019 5:54 PM

## 2019-11-07 NOTE — ED Notes (Signed)
Pt unable to tolerate oral medications d/t decreased responsiveness.

## 2019-11-07 NOTE — ED Provider Notes (Signed)
[name] EMERGENCY DEPARTMENT Provider Note   CSN: [id number] Arrival date & time: 11/04/2019  1545     History Chief Complaint  Patient presents with  . COVID +    Sue Schaefer is a 85 y.o. female.  HPI   This patient is a 85 year old female, she is critically ill appearing as she arrives unable to talk, according to the medical record the patient is in a nursing facility, she was evidently tested positive for coronavirus.  She has a history of some type of lung disease as she is on both Breo and dexamethasone.  She has dementia and takes donepezil, she is on furosemide and isosorbide as well as memantine.  She was recently diagnosed with coronavirus 19, she has had progressive altered mental status and shortness of breath and presents today severely tachycardic hypoxic and ill-appearing in fact she was so cold in her extremities that they were unable to get an oxygen level prehospital.  Level 5 caveat applies.  Past Medical History:  Diagnosis Date  . Anemia   . Hypertension   . Memory loss   . Osteoporosis   . URI (upper respiratory infection)   . Vitamin D deficiency     Patient Active Problem List   Diagnosis Date Noted  . Essential hypertension, benign 04/03/2013  . CAD (coronary artery disease) of artery bypass graft 04/03/2013  . Dementia with behavioral disturbance ([place]) 01/29/2013  . Hyperlipidemia LDL goal <130 01/29/2013  . Hearing loss 01/29/2013  . Coronary artery disease 01/29/2013  . Osteopenia 01/29/2013    No past surgical history on file.   OB History   No obstetric history on file.     No family history on file.  Social History   Tobacco Use  . Smoking status: Never Smoker  . Smokeless tobacco: Never Used  Substance Use Topics  . Alcohol use: No  . Drug use: No    Home Medications Prior to Admission medications   Medication Sig Start Date End Date Taking? Authorizing Provider  amLODipine (NORVASC) 5 MG tablet Take 1 tablet (5 mg  total) by mouth daily. 08/25/15   Claretta Fraise, MD  Ascorbic Acid (VITAMIN C) 500 MG CHEW Chew 1 tablet (500 mg total) by mouth daily. 10/26/19   Dettinger, Fransisca Kaufmann, MD  aspirin 81 MG chewable tablet Chew 81 mg by mouth daily.      [provider]  dexamethasone (DECADRON) 4 MG tablet Take 1 tablet (4 mg total) by mouth daily. 10/26/19   Dettinger, Fransisca Kaufmann, MD  docusate sodium (COLACE) 100 MG capsule Take 1 capsule (100 mg total) by mouth 2 (two) times daily. To soften stool 10/29/16   Claretta Fraise, MD  donepezil (ARICEPT) 10 MG tablet Take 1 tablet (10 mg total) by mouth at bedtime as needed. 08/25/15   Claretta Fraise, MD  fluticasone furoate-vilanterol (BREO ELLIPTA) 200-25 MCG/INH AEPB Inhale 1 puff into the lungs daily. 02/23/17   Claretta Fraise, MD  furosemide (LASIX) 40 MG tablet TAKE 1 TABLET DAILY 08/25/15   Claretta Fraise, MD  isosorbide mononitrate (IMDUR) 30 MG 24 hr tablet Take 1 tablet (30 mg total) by mouth daily. 08/25/15   Claretta Fraise, MD  Lutein 6 MG CAPS Take by mouth daily.      [provider]  Misc. Devices (TRANSPORT CHAIR) MISC 1 each by Does not apply route daily. 02/06/16   Hassell Done Mary-Margaret, FNP  MYRBETRIQ 50 MG TB24 tablet TAKE ONE TABLET AT BEDTIME 02/04/16   Stacks,  Cletus Gash, MD  NAMENDA XR 28 MG CP24 24 hr capsule TAKE (1) CAPSULE DAILY 01/19/16   Claretta Fraise, MD  nitrofurantoin, macrocrystal-monohydrate, (MACROBID) 100 MG capsule Take 100 mg by mouth 2 (two) times daily.    [provider]  nitroGLYCERIN (NITROSTAT) 0.4 MG SL tablet Place 0.4 mg under the tongue every 5 (five) minutes as needed.      [provider]  Polyethyl Glycol-Propyl Glycol (SYSTANE) 0.4-0.3 % SOLN Apply 1 drop to eye 4 (four) times daily.    [provider]  potassium chloride SA (K-DUR,KLOR-CON) 20 MEQ tablet TAKE 1 TABLET DAILY 06/12/15   Benjamin Stain, Tiffany A, PA-C  prednisoLONE acetate (PRED FORTE) 1 % ophthalmic suspension Place 1 drop into  the right eye 2 (two) times daily.    [provider]  tiZANidine (ZANAFLEX) 2 MG tablet TAKE 1 TABLET AT BEDTIME AS NEEDED FOR MUSCLE SPASMS 11/02/17   Dettinger, Fransisca Kaufmann, MD  Urine Specimen Collection KIT Urine Specimen needed 08/08/17   Dettinger, Fransisca Kaufmann, MD  zinc gluconate 50 MG tablet Take 1 tablet (50 mg total) by mouth daily. 10/26/19   Dettinger, Fransisca Kaufmann, MD    Allergies    Erythromycin base, Keflex [cephalexin], Other, Penicillins cross reactors, and Sulfamethoxazole  Review of Systems   Review of Systems  Unable to perform ROS: Mental status change    Physical Exam Updated Vital Signs There were no vitals taken for this visit.  Physical Exam Vitals and nursing note reviewed.  Constitutional:      General: She is in acute distress.     Appearance: She is well-developed. She is ill-appearing.     Comments: Obtunded  HENT:     Head: Normocephalic and atraumatic.     Nose: Nose normal.     Mouth/Throat:     Mouth: Mucous membranes are dry.     Pharynx: No oropharyngeal exudate.  Eyes:     General: No scleral icterus.       Right eye: No discharge.        Left eye: No discharge.     Conjunctiva/sclera: Conjunctivae normal.     Pupils: Pupils are equal, round, and reactive to light.     Comments: Sunken eyes  Neck:     Thyroid: No thyromegaly.     Vascular: No JVD.  Cardiovascular:     Rate and Rhythm: Tachycardia present. Rhythm irregular.     Heart sounds: Normal heart sounds. No murmur. No friction rub. No gallop.      Comments: Atrial fibrillation with rapid ventricular rate in the 140s to 160s, weak peripheral pulses Pulmonary:     Effort: Respiratory distress present.     Breath sounds: Wheezing and rales present.     Comments: Rales left greater than right at the bases, tachypneic, using accessory muscles Abdominal:     General: Bowel sounds are normal. There is no distension.     Palpations: Abdomen is soft. There is no mass.     Tenderness:  There is no abdominal tenderness.     Comments: No palpable masses in the abdomen  Musculoskeletal:        General: No swelling, deformity or signs of injury.     Cervical back: Normal range of motion and neck supple.     Right lower leg: No edema.     Left lower leg: No edema.  Lymphadenopathy:     Cervical: No cervical adenopathy.  Skin:    General: Skin is warm  and dry.     Findings: No erythema or rash.  Neurological:     Comments: Obtunded, occasionally responds to painful stimuli but does not follow commands, does not talk  Psychiatric:        Behavior: Behavior normal.     ED Results / Procedures / Treatments   Labs (all labs ordered are listed, but only abnormal results are displayed) Labs Reviewed  URINALYSIS, ROUTINE W REFLEX MICROSCOPIC - Abnormal; Notable for the following components:      Result Value   APPearance HAZY (*)    All other components within normal limits  LACTIC ACID, PLASMA - Abnormal; Notable for the following components:   Lactic Acid, Venous 4.2 (*)    All other components within normal limits  COMPREHENSIVE METABOLIC PANEL - Abnormal; Notable for the following components:   Sodium 148 (*)    Chloride 114 (*)    CO2 19 (*)    Glucose, Bld 132 (*)    BUN 48 (*)    Creatinine, Ser 1.33 (*)    Albumin 3.2 (*)    Total Bilirubin 1.4 (*)    GFR calc non Af Amer 33 (*)    GFR calc Af Amer 39 (*)    All other components within normal limits  CBC WITH DIFFERENTIAL/PLATELET - Abnormal; Notable for the following components:   WBC 21.6 (*)    Platelets 131 (*)    Neutro Abs 19.7 (*)    Abs Immature Granulocytes 0.15 (*)    All other components within normal limits  POC SARS CORONAVIRUS 2 AG -  ED - Abnormal; Notable for the following components:   SARS Coronavirus 2 Ag POSITIVE (*)    All other components within normal limits  CULTURE, BLOOD (ROUTINE X 2)  CULTURE, BLOOD (ROUTINE X 2)  URINE CULTURE  APTT  PROCALCITONIN  LACTATE DEHYDROGENASE   TRIGLYCERIDES  FIBRINOGEN  LACTIC ACID, PLASMA  PROTIME-INR  D-DIMER, QUANTITATIVE (NOT AT [facility])  FERRITIN  C-REACTIVE PROTEIN    EKG EKG Interpretation  Date/Time:  Wednesday November 07 2019 15:53:14 EST Ventricular Rate:  145 PR Interval:    QRS Duration: 86 QT Interval:  293 QTC Calculation: 441 R Axis:   9 Text Interpretation: Atrial fibrillation with rapid V-rate Non-specific ST-t changes Abnormal ekg No old tracing to compare Confirmed by Noemi Chapel [phone]) on 11/01/2019 4:52:46 PM   Radiology DG Chest Port 1 View  Result Date: 10/29/2019 CLINICAL DATA:  Respiratory failure EXAM: PORTABLE CHEST 1 VIEW COMPARISON:  08/24/2017 FINDINGS: Increased interstitial markings. Mild left basilar/left lower lobe opacities. No pleural effusion or pneumothorax. The heart is normal in size.  Thoracic aortic atherosclerosis. Bilateral rib fracture deformities, chronic. IMPRESSION: Left lower lobe opacity with increased interstitial markings, raising concern for possible pneumonia. Electronically Signed   By: Julian Hy M.D.   On: 10/17/2019 16:59    Procedures .Critical Care Performed by: Noemi Chapel, MD Authorized by: Noemi Chapel, MD   Critical care provider statement:    Critical care time (minutes):  35   Critical care time was exclusive of:  Separately billable procedures and treating other patients and teaching time   Critical care was necessary to treat or prevent imminent or life-threatening deterioration of the following conditions:  Cardiac failure and respiratory failure   Critical care was time spent personally by me on the following activities:  Blood draw for specimens, development of treatment plan with patient or surrogate, discussions with consultants, evaluation of patient's response to  treatment, examination of patient, obtaining history from patient or surrogate, ordering and performing treatments and interventions, ordering and review of laboratory  studies, ordering and review of radiographic studies, pulse oximetry, re-evaluation of patient's condition and review of old charts   (including critical care time)  Medications Ordered in ED Medications - No data to display  ED Course  I have reviewed the triage vital signs and the nursing notes.  Pertinent labs & imaging results that were available during my care of the patient were reviewed by me and considered in my medical decision making (see chart for details).  Clinical Course as of Nov 07 1747  Wed Nov 07, 2019  1704 Similar to my exam the patient has a pneumonia at the left lung base which is where I was hearing rales.  Antibiotics have been ordered, the patient will need to be admitted to the hospital   [BM]    Clinical Course User Index [BM] Noemi Chapel, MD   MDM Rules/Calculators/A&P                      This patient is ill-appearing, she is critically ill with what appears to be pneumonia, she is in atrial fibrillation with rapid ventricular rate, she is not perfusing very well, I suspect that this is a severe and potentially life-threatening illness for this patient.  She will need a chest x-ray for further evaluation of her lungs, she will need to be placed on oxygen, she will need some fluids as she appears dehydrated, the rate control will need to occur as well though I suspect that the atrial fibrillation is probably being driven by her underlying ill state.  Additional history obtained from daughter - states that she was ill on Sunday - not herself, not alert - getting worse - BP had been low - HR had been high. She was dx + for Covid 3 weeks ago.  I have discussed the care with Ms. Lynn Ito, the patient's daughter, she reports that she is agreeable to making her mother comfortable and not putting her on life support however she would want her to have antibiotics and IV fluids as needed to help with treating any potential underlying infections.  At this time we  will treat the patient for sepsis, evidently she was symptom-free for Covid when she was diagnosed 3 weeks ago and only started feeling off recently.  Labs have resulted showing a lactic acid over 4, a leukocytosis of over 20,000, she is coronavirus positive but also has a finding of pneumonia on her x-ray.  We will continue with sepsis protocol getting IV fluids at 30 cc/kg, broad-spectrum antibiotics have been ordered already and I will discussed the care with the hospitalist.  The patient is critically ill  D/w hospitalist who will admit  Sue Schaefer was evaluated in Emergency Department on 10/28/2019 for the symptoms described in the history of present illness. She was evaluated in the context of the global COVID-19 pandemic, which necessitated consideration that the patient might be at risk for infection with the SARS-CoV-2 virus that causes COVID-19. Institutional protocols and algorithms that pertain to the evaluation of patients at risk for COVID-19 are in a state of rapid change based on information released by regulatory bodies including the CDC and federal and state organizations. These policies and algorithms were followed during the patient's care in the ED.  Final Clinical Impression(s) / ED Diagnoses Final diagnoses:  Acute respiratory failure with hypoxia ([place])  Septic shock ([place])  HCAP (healthcare-associated pneumonia)  Pneumonia due to COVID-19 virus      Noemi Chapel, MD 11/05/2019 7619

## 2019-11-08 ENCOUNTER — Encounter (HOSPITAL_COMMUNITY): Payer: Self-pay | Admitting: Family Medicine

## 2019-11-08 ENCOUNTER — Inpatient Hospital Stay (HOSPITAL_COMMUNITY): Payer: Medicare HMO

## 2019-11-08 ENCOUNTER — Other Ambulatory Visit: Payer: Self-pay

## 2019-11-08 DIAGNOSIS — I340 Nonrheumatic mitral (valve) insufficiency: Secondary | ICD-10-CM

## 2019-11-08 DIAGNOSIS — R6521 Severe sepsis with septic shock: Secondary | ICD-10-CM | POA: Diagnosis not present

## 2019-11-08 DIAGNOSIS — I25700 Atherosclerosis of coronary artery bypass graft(s), unspecified, with unstable angina pectoris: Secondary | ICD-10-CM

## 2019-11-08 DIAGNOSIS — R791 Abnormal coagulation profile: Secondary | ICD-10-CM

## 2019-11-08 DIAGNOSIS — I82401 Acute embolism and thrombosis of unspecified deep veins of right lower extremity: Secondary | ICD-10-CM

## 2019-11-08 DIAGNOSIS — I10 Essential (primary) hypertension: Secondary | ICD-10-CM

## 2019-11-08 DIAGNOSIS — I330 Acute and subacute infective endocarditis: Secondary | ICD-10-CM | POA: Diagnosis not present

## 2019-11-08 DIAGNOSIS — J1282 Pneumonia due to coronavirus disease 2019: Secondary | ICD-10-CM | POA: Diagnosis present

## 2019-11-08 DIAGNOSIS — J1289 Other viral pneumonia: Secondary | ICD-10-CM

## 2019-11-08 DIAGNOSIS — I2699 Other pulmonary embolism without acute cor pulmonale: Secondary | ICD-10-CM | POA: Diagnosis not present

## 2019-11-08 DIAGNOSIS — U071 COVID-19: Secondary | ICD-10-CM

## 2019-11-08 DIAGNOSIS — A4189 Other specified sepsis: Secondary | ICD-10-CM | POA: Diagnosis not present

## 2019-11-08 DIAGNOSIS — I351 Nonrheumatic aortic (valve) insufficiency: Secondary | ICD-10-CM

## 2019-11-08 DIAGNOSIS — Y95 Nosocomial condition: Secondary | ICD-10-CM | POA: Diagnosis not present

## 2019-11-08 DIAGNOSIS — G9341 Metabolic encephalopathy: Secondary | ICD-10-CM | POA: Diagnosis not present

## 2019-11-08 DIAGNOSIS — A419 Sepsis, unspecified organism: Secondary | ICD-10-CM

## 2019-11-08 DIAGNOSIS — Z66 Do not resuscitate: Secondary | ICD-10-CM | POA: Diagnosis not present

## 2019-11-08 DIAGNOSIS — J189 Pneumonia, unspecified organism: Secondary | ICD-10-CM | POA: Diagnosis not present

## 2019-11-08 DIAGNOSIS — J9601 Acute respiratory failure with hypoxia: Secondary | ICD-10-CM

## 2019-11-08 DIAGNOSIS — F0281 Dementia in other diseases classified elsewhere with behavioral disturbance: Secondary | ICD-10-CM

## 2019-11-08 LAB — LACTIC ACID, PLASMA
Lactic Acid, Venous: 3.1 mmol/L (ref 0.5–1.9)
Lactic Acid, Venous: 3 mmol/L (ref 0.5–1.9)
Lactic Acid, Venous: 3.3 mmol/L (ref 0.5–1.9)
Lactic Acid, Venous: 3 mmol/L (ref 0.5–1.9)

## 2019-11-08 LAB — COMPREHENSIVE METABOLIC PANEL
ALT: 15 U/L (ref 0–44)
AST: 19 U/L (ref 15–41)
Albumin: 2.5 g/dL — ABNORMAL LOW (ref 3.5–5.0)
Alkaline Phosphatase: 57 U/L (ref 38–126)
Anion gap: 14 (ref 5–15)
BUN: 35 mg/dL — ABNORMAL HIGH (ref 8–23)
CO2: 18 mmol/L — ABNORMAL LOW (ref 22–32)
Calcium: 7.6 mg/dL — ABNORMAL LOW (ref 8.9–10.3)
Chloride: 117 mmol/L — ABNORMAL HIGH (ref 98–111)
Creatinine, Ser: 0.92 mg/dL (ref 0.44–1.00)
GFR calc Af Amer: >60 mL/min (ref >60)
GFR calc non Af Amer: 52 mL/min — ABNORMAL LOW (ref >60)
Glucose, Bld: 165 mg/dL — ABNORMAL HIGH (ref 70–99)
Potassium: 3.9 mmol/L (ref 3.5–5.1)
Sodium: 149 mmol/L — ABNORMAL HIGH (ref 135–145)
Total Bilirubin: 1.1 mg/dL (ref 0.3–1.2)
Total Protein: 5.9 g/dL — ABNORMAL LOW (ref 6.5–8.1)

## 2019-11-08 LAB — CBC WITH DIFFERENTIAL/PLATELET
Abs Immature Granulocytes: 0.13 10*3/uL — ABNORMAL HIGH (ref 0.00–0.07)
Basophils Absolute: 0 10*3/uL (ref 0.0–0.1)
Basophils Relative: 0 %
Eosinophils Absolute: 0 10*3/uL (ref 0.0–0.5)
Eosinophils Relative: 0 %
HCT: 36.6 % (ref 36.0–46.0)
Hemoglobin: 11.2 g/dL — ABNORMAL LOW (ref 12.0–15.0)
Immature Granulocytes: 1 %
Lymphocytes Relative: 3 %
Lymphs Abs: 0.6 10*3/uL — ABNORMAL LOW (ref 0.7–4.0)
MCH: 28.8 pg (ref 26.0–34.0)
MCHC: 30.6 g/dL (ref 30.0–36.0)
MCV: 94.1 fL (ref 80.0–100.0)
Monocytes Absolute: 0.7 10*3/uL (ref 0.1–1.0)
Monocytes Relative: 4 %
Neutro Abs: 17.8 10*3/uL — ABNORMAL HIGH (ref 1.7–7.7)
Neutrophils Relative %: 92 %
Platelets: 100 10*3/uL — ABNORMAL LOW (ref 150–400)
RBC: 3.89 MIL/uL (ref 3.87–5.11)
RDW: 14.6 % (ref 11.5–15.5)
WBC: 19.3 10*3/uL — ABNORMAL HIGH (ref 4.0–10.5)
nRBC: 0 % (ref 0.0–0.2)

## 2019-11-08 LAB — ECHOCARDIOGRAM COMPLETE
Height: 62 in
Weight: 1559.09 oz

## 2019-11-08 LAB — ABO/RH: ABO/RH(D): O POS

## 2019-11-08 LAB — URINE CULTURE: Culture: NO GROWTH

## 2019-11-08 LAB — MAGNESIUM
Magnesium: 2.1 mg/dL (ref 1.7–2.4)
Magnesium: 2.1 mg/dL (ref 1.7–2.4)

## 2019-11-08 LAB — GLUCOSE, CAPILLARY
Glucose-Capillary: 127 mg/dL — ABNORMAL HIGH (ref 70–99)
Glucose-Capillary: 120 mg/dL — ABNORMAL HIGH (ref 70–99)

## 2019-11-08 LAB — PROCALCITONIN
Procalcitonin: 0.76 ng/mL
Procalcitonin: 0.66 ng/mL

## 2019-11-08 LAB — PHOSPHORUS
Phosphorus: 2.3 mg/dL — ABNORMAL LOW (ref 2.5–4.6)
Phosphorus: 2 mg/dL — ABNORMAL LOW (ref 2.5–4.6)

## 2019-11-08 LAB — TROPONIN I (HIGH SENSITIVITY)
Troponin I (High Sensitivity): 16 ng/L (ref <18)
Troponin I (High Sensitivity): 17 ng/L (ref <18)

## 2019-11-08 LAB — MRSA PCR SCREENING: MRSA by PCR: NEGATIVE

## 2019-11-08 LAB — HEPARIN LEVEL (UNFRACTIONATED): Heparin Unfractionated: 0.18 IU/mL — ABNORMAL LOW (ref 0.30–0.70)

## 2019-11-08 LAB — C-REACTIVE PROTEIN: CRP: 21.8 mg/dL — ABNORMAL HIGH (ref <1.0)

## 2019-11-08 LAB — D-DIMER, QUANTITATIVE: D-Dimer, Quant: >20 ug/mL-FEU — ABNORMAL HIGH (ref 0.00–0.50)

## 2019-11-08 LAB — BRAIN NATRIURETIC PEPTIDE: B Natriuretic Peptide: 597.9 pg/mL — ABNORMAL HIGH (ref 0.0–100.0)

## 2019-11-08 LAB — FERRITIN: Ferritin: 593 ng/mL — ABNORMAL HIGH (ref 11–307)

## 2019-11-08 MED ORDER — PRO-STAT SUGAR FREE PO LIQD
30.0000 mL | Freq: Two times a day (BID) | ORAL | Status: AC
  Administered 2019-11-08 (×2): 30 mL
  Filled 2019-11-08 (×2): qty 30

## 2019-11-08 MED ORDER — HEPARIN (PORCINE) 25000 UT/250ML-% IV SOLN
650.0000 [IU]/h | INTRAVENOUS | Status: DC
  Administered 2019-11-08: 650 [IU]/h via INTRAVENOUS
  Filled 2019-11-08: qty 250

## 2019-11-08 MED ORDER — SODIUM CHLORIDE 0.9 % IV SOLN
200.0000 mg | Freq: Once | INTRAVENOUS | Status: AC
  Administered 2019-11-08: 200 mg via INTRAVENOUS
  Filled 2019-11-08: qty 40

## 2019-11-08 MED ORDER — SODIUM CHLORIDE 0.9 % IV SOLN
2.0000 g | INTRAVENOUS | Status: DC
  Administered 2019-11-08 – 2019-11-10 (×3): 2 g via INTRAVENOUS
  Filled 2019-11-08 (×4): qty 2

## 2019-11-08 MED ORDER — VITAL HIGH PROTEIN PO LIQD
1000.0000 mL | ORAL | Status: AC
  Administered 2019-11-08: 1000 mL

## 2019-11-08 MED ORDER — CHLORHEXIDINE GLUCONATE CLOTH 2 % EX PADS
6.0000 | MEDICATED_PAD | Freq: Every day | CUTANEOUS | Status: DC
  Administered 2019-11-08 – 2019-11-12 (×5): 6 via TOPICAL

## 2019-11-08 MED ORDER — GUAIFENESIN-DM 100-10 MG/5ML PO SYRP: 10.0000 mL | ORAL_SOLUTION | ORAL | Status: DC | PRN

## 2019-11-08 MED ORDER — TOCILIZUMAB 400 MG/20ML IV SOLN
340.0000 mg | Freq: Once | INTRAVENOUS | Status: AC
  Administered 2019-11-08: 340 mg via INTRAVENOUS
  Filled 2019-11-08: qty 17

## 2019-11-08 MED ORDER — ASCORBIC ACID 500 MG PO TABS
500.0000 mg | ORAL_TABLET | Freq: Every day | ORAL | Status: DC
  Administered 2019-11-08 – 2019-11-09 (×2): 500 mg via ORAL
  Filled 2019-11-08 (×2): qty 1

## 2019-11-08 MED ORDER — IOHEXOL 350 MG/ML SOLN
75.0000 mL | Freq: Once | INTRAVENOUS | Status: AC | PRN
  Administered 2019-11-08: 75 mL via INTRAVENOUS

## 2019-11-08 MED ORDER — OSMOLITE 1.2 CAL PO LIQD
1000.0000 mL | ORAL | Status: DC
  Administered 2019-11-09 – 2019-11-10 (×3): 1000 mL
  Filled 2019-11-08 (×10): qty 1000

## 2019-11-08 MED ORDER — SODIUM CHLORIDE 0.9 % IV SOLN: INTRAVENOUS | Status: DC

## 2019-11-08 MED ORDER — VITAL HIGH PROTEIN PO LIQD
1000.0000 mL | ORAL | Status: DC
  Administered 2019-11-08: 1000 mL

## 2019-11-08 MED ORDER — ALBUMIN HUMAN 25 % IV SOLN
50.0000 g | Freq: Once | INTRAVENOUS | Status: AC
  Administered 2019-11-08: 16:00:00 50 g via INTRAVENOUS
  Filled 2019-11-08: qty 50

## 2019-11-08 MED ORDER — VITAL HIGH PROTEIN PO LIQD: 1000.0000 mL | ORAL | Status: DC

## 2019-11-08 MED ORDER — ORAL CARE MOUTH RINSE
15.0000 mL | Freq: Two times a day (BID) | OROMUCOSAL | Status: DC
  Administered 2019-11-08 – 2019-11-15 (×13): 15 mL via OROMUCOSAL

## 2019-11-08 MED ORDER — ZINC SULFATE 220 (50 ZN) MG PO CAPS
220.0000 mg | ORAL_CAPSULE | Freq: Every day | ORAL | Status: DC
  Administered 2019-11-08 – 2019-11-09 (×2): 220 mg via ORAL
  Filled 2019-11-08 (×2): qty 1

## 2019-11-08 MED ORDER — DILTIAZEM HCL-DEXTROSE 125-5 MG/125ML-% IV SOLN (PREMIX)
2.5000 mg/h | INTRAVENOUS | Status: DC
  Administered 2019-11-08: 5 mg/h via INTRAVENOUS
  Administered 2019-11-10 – 2019-11-14 (×3): 2.5 mg/h via INTRAVENOUS
  Filled 2019-11-08 (×5): qty 125

## 2019-11-08 MED ORDER — INSULIN ASPART 100 UNIT/ML ~~LOC~~ SOLN
0.0000 [IU] | Freq: Three times a day (TID) | SUBCUTANEOUS | Status: DC
  Administered 2019-11-09: 2 [IU] via SUBCUTANEOUS
  Administered 2019-11-09: 3 [IU] via SUBCUTANEOUS
  Administered 2019-11-09: 2 [IU] via SUBCUTANEOUS

## 2019-11-08 MED ORDER — INSULIN ASPART 100 UNIT/ML ~~LOC~~ SOLN: 0.0000 [IU] | Freq: Every day | SUBCUTANEOUS | Status: DC

## 2019-11-08 MED ORDER — HEPARIN (PORCINE) 25000 UT/250ML-% IV SOLN
750.0000 [IU]/h | INTRAVENOUS | Status: DC
  Administered 2019-11-08: 750 [IU]/h via INTRAVENOUS

## 2019-11-08 MED ORDER — "THROMBI-PAD 3""X3"" EX PADS"
1.0000 | MEDICATED_PAD | Freq: Once | CUTANEOUS | Status: AC
  Administered 2019-11-08: 1 via TOPICAL
  Filled 2019-11-08: qty 1

## 2019-11-08 MED ORDER — SODIUM CHLORIDE 0.9 % IV SOLN
100.0000 mg | Freq: Every day | INTRAVENOUS | Status: AC
  Administered 2019-11-09 – 2019-11-12 (×4): 100 mg via INTRAVENOUS
  Filled 2019-11-08 (×3): qty 20

## 2019-11-08 MED ORDER — SODIUM CHLORIDE 0.45 % IV BOLUS
1000.0000 mL | Freq: Once | INTRAVENOUS | Status: AC
  Administered 2019-11-08: 1000 mL via INTRAVENOUS

## 2019-11-08 NOTE — Progress Notes (Signed)
eLink Physician-Brief Progress Note Patient Name: Sue Schaefer DOB: Feb 24, 1920 MRN: [id number]   Date of Service  11/08/2019  HPI/Events of Note  PT COVID + for 2 weeks now with progressive respiratory distress, admitted to [facility] for Rx of presumed COVID pneumonia. Pt is not very responsive, which is different from her reported baseline.  eICU Interventions  New patient evaluation completed, DVT prophylaxis will be ordered, if she remains unresponsive a CT brain may be indicated.        Kerry Kass Tyeshia Cornforth 11/08/2019, 3:29 AM

## 2019-11-08 NOTE — Progress Notes (Signed)
ANTICOAGULATION CONSULT NOTE - Follow Up Consult  Pharmacy Consult for Heparin Indication: r/o VTE  Allergies  Allergen Reactions  . Erythromycin Base Other (See Comments)    mycins"- "burned all the way through"  . Keflex [Cephalexin] Itching  . Other Itching    Mycins made patient itch  . Penicillins Cross Reactors     Patient is allergic to ALL MYCINS DRUGS!!!  . Sulfamethoxazole Rash    unknown    Patient Measurements: Height: 5\' 2"  (157.5 cm) Weight: 97 lb 7.1 oz (44.2 kg) IBW/kg (Calculated) : 50.1 Heparin Dosing Weight: TBW  Vital Signs: Temp: 97.3 F (36.3 C) (12/31 0800) Temp Source: Axillary (12/31 0800) BP: 112/56 (12/31 0817) Pulse Rate: 102 (12/31 0817)  Labs: Recent Labs    10/17/2019 1645 10/10/2019 2203 11/08/19 0605  HGB 12.9  --  11.2*  HCT 41.3  --  36.6  PLT 131*  --  100*  APTT 30  --   --   LABPROT 18.3*  --   --   INR 1.5*  --   --   CREATININE 1.33* 1.13* 0.92    Estimated Creatinine Clearance: 24.4 mL/min (by C-G formula based on SCr of 0.92 mg/dL).   Medications:  Scheduled:  . amLODipine  5 mg Oral Daily  . vitamin C  500 mg Oral Daily  . aspirin  81 mg Oral Daily  . atenolol  25 mg Oral Daily  . Chlorhexidine Gluconate Cloth  6 each Topical Daily  . dexamethasone (DECADRON) injection  6 mg Intravenous Q24H  . docusate sodium  100 mg Oral BID  . donepezil  10 mg Oral QHS  . heparin  5,000 Units Subcutaneous Q8H  . isosorbide mononitrate  30 mg Oral Daily  . mouth rinse  15 mL Mouth Rinse BID  . memantine  14 mg Oral Daily  . mirabegron ER  25 mg Oral QHS  . tiZANidine  2 mg Oral Once  . zinc sulfate  220 mg Oral Daily   Infusions:  . aztreonam Stopped (11/08/19 0036)  . [START ON 11/09/2019] remdesivir 100 mg in NS 100 mL    . [START ON 11/09/2019] vancomycin      Assessment: 7 yoF admitted on 12/30 with COVID-19 pneumonia.  She was initially started on Heparin [place] for VTE prophylaxis.  Pharmacy is now consulted to change to  IV heparin for r/o VTE.   Baseline coags 12/30 were WNL except INR 1.5.  APTT was 30 Most recent dose Heparin 5000 units [place] TID - on 12/31 at 06:00  Today, 11/08/2019: D-dimer > 20 CBC: Hgb decreased 12.9 > 11.2; Plt decreased 131 > 100k No bleeding or complications reported. SCr improving 1.3 >> 0.92   Goal of Therapy:  Heparin level 0.3-0.7 units/ml Monitor platelets by anticoagulation protocol: Yes   Plan:  No heparin bolus due to previous heparin  5,000 unit dose given this morning, ~ 3 hours ago. Start heparin IV infusion at 650 units/hr ([place] dosing) Heparin level 8 hours after starting Daily heparin level and CBC Continue to monitor H&H and platelets   Gretta Arab PharmD, BCPS Clinical pharmacist phone 7am- 5pm: [phone] 11/08/2019 8:33 AM

## 2019-11-08 NOTE — ED Notes (Signed)
[place]

## 2019-11-08 NOTE — Progress Notes (Signed)
Daughter called and updated about patient's arrival to ICU and with assistance finishing admission.  All questions answered at this time.

## 2019-11-08 NOTE — ED Notes (Addendum)
Date and time results received: 11/08/19 0109 (use smartphrase ".now" to insert current time)  Test: Lactic Acid Critical Value: 3.1  Name of Provider Notified: Volanda Napoleon  Orders Received? Or Actions Taken?:

## 2019-11-08 NOTE — Progress Notes (Signed)
ANTICOAGULATION CONSULT NOTE - Follow Up Consult  Pharmacy Consult for Heparin Indication: r/o VTE  Allergies  Allergen Reactions  . Erythromycin Base Other (See Comments)    mycins"- "burned all the way through"  . Keflex [Cephalexin] Itching  . Other Itching    Mycins made patient itch  . Penicillins Cross Reactors     Patient is allergic to ALL MYCINS DRUGS!!!  . Sulfamethoxazole Rash    unknown    Patient Measurements: Height: 5\' 2"  (157.5 cm) Weight: 97 lb 7.1 oz (44.2 kg) IBW/kg (Calculated) : 50.1 Heparin Dosing Weight: TBW  Vital Signs: Temp: 98.3 F (36.8 C) (12/31 1600) Temp Source: Axillary (12/31 1600) BP: 102/46 (12/31 1800) Pulse Rate: 107 (12/31 1800)  Labs: Recent Labs    10/24/2019 1645 11/06/2019 2203 11/08/19 0605 11/08/19 1005 11/08/19 1140 11/08/19 1655  HGB 12.9  --  11.2*  --   --   --   HCT 41.3  --  36.6  --   --   --   PLT 131*  --  100*  --   --   --   APTT 30  --   --   --   --   --   LABPROT 18.3*  --   --   --   --   --   INR 1.5*  --   --   --   --   --   HEPARINUNFRC  --   --   --   --   --  0.18*  CREATININE 1.33* 1.13* 0.92  --   --   --   TROPONINIHS  --   --   --  16 17  --     Estimated Creatinine Clearance: 24.4 mL/min (by C-G formula based on SCr of 0.92 mg/dL).   Medications:  Scheduled:  . vitamin C  500 mg Oral Daily  . aspirin  81 mg Oral Daily  . atenolol  25 mg Oral Daily  . Chlorhexidine Gluconate Cloth  6 each Topical Daily  . dexamethasone (DECADRON) injection  6 mg Intravenous Q24H  . docusate sodium  100 mg Oral BID  . donepezil  10 mg Oral QHS  . feeding supplement (PRO-STAT SUGAR FREE 64)  30 mL Per Tube BID  . insulin aspart  0-5 Units Subcutaneous QHS  . insulin aspart  0-9 Units Subcutaneous TID WC  . isosorbide mononitrate  30 mg Oral Daily  . mouth rinse  15 mL Mouth Rinse BID  . memantine  14 mg Oral Daily  . mirabegron ER  25 mg Oral QHS  . tiZANidine  2 mg Oral Once  . zinc sulfate  220 mg  Oral Daily   Infusions:  . sodium chloride 75 mL/hr at 11/08/19 1735  . ceFEPime (MAXIPIME) IV 2 g (11/08/19 1754)  . diltiazem (CARDIZEM) infusion Stopped (11/08/19 1506)  . [START ON 11/09/2019] feeding supplement (OSMOLITE 1.2 CAL)    . feeding supplement (VITAL HIGH PROTEIN)    . heparin 650 Units/hr (11/08/19 1735)  . [START ON 11/09/2019] remdesivir 100 mg in NS 100 mL    . [START ON 11/09/2019] vancomycin      Assessment: 43 yoF admitted on 12/30 with COVID-19 pneumonia.  She was initially started on Heparin [place] for VTE prophylaxis.  Pharmacy is now consulted to change to IV heparin for r/o VTE.   Baseline coags 12/30 were WNL except INR 1.5.  APTT was 30 Most recent dose Heparin 5000 units  [place] TID - on 12/31 at 06:00  Today, 11/08/2019: D-dimer > 20 CBC: Hgb decreased 12.9 > 11.2; Plt decreased 131 > 100k No bleeding or complications reported. SCr improving 1.3 >> 0.92  PM: heparin level subtherapeutic 0.18 on 650 units/hr.  RN reports IV site bled when IV was removed, dressing was applied and bleeding stopped.  Then about an hour later it had another large bleed. Initially controlled, then bleeding recurred. Thrombipad now applied, MD aware.   Goal of Therapy:  Heparin level 0.3-0.7 units/ml Monitor platelets by anticoagulation protocol: Yes   Plan:   Increase heparin IV infusion to 750 units/hr   Heparin level 8 hours after rate increase  Daily heparin level and CBC  Continue to monitor H&H and platelets   Peggyann Juba, PharmD, BCPS Pharmacy: [phone] 11/08/2019 6:45 PM

## 2019-11-08 NOTE — Progress Notes (Signed)
Initial Nutrition Assessment  DOCUMENTATION CODES:   Underweight  INTERVENTION:   Continue Vital High Protein @ 40 until 1/1 1000 Then transition to Osmolite 1.2 @ 55 ml/hr  Provides: 1584 kcal, 73 grams protein, and 1070 ml free water.   Monitor magnesium and phosphorus every 12 hours x 4 occurances, MD to replete as needed, as pt is at risk for refeeding syndrome given pt underweight.   NUTRITION DIAGNOSIS:   Increased nutrient needs related to acute illness(COVID-19) as evidenced by estimated needs.  GOAL:   Patient will meet greater than or equal to 90% of their needs  MONITOR:   TF tolerance  REASON FOR ASSESSMENT:   Consult Enteral/tube feeding initiation and management  ASSESSMENT:   Pt with PMH of dementia, HTN, vitamin D deficiency who tested positive for COVID-19 three weeks ago. Now admitted with hypoxia and likely sepsis from superimposed bacterial infection.   Pt currently nonverbal, which is not her baseline. Pt made NPO. Consulted to initiate TF. Pt now DNR but not comfort care.  Per chart review pt with > 10 kg weight loss since recorded weight in 2019. Pt currently underweight and severe malnutrition suspected.   Medications reviewed and include: vitamin C, decadron, colace, zinc, remdesivir  Labs reviewed: Na 149 (H), PO4: 2.3 (L)   TF: Vital High Protein @ 40 ml/hr with 30 ml Prostat BID Provides: 1160 kcal, and 114 grams protein   NUTRITION - FOCUSED PHYSICAL EXAM:  Deferred   Diet Order:   Diet Order            Diet NPO time specified  Diet effective now              EDUCATION NEEDS:   No education needs have been identified at this time  Skin:  Skin Assessment: Reviewed RN Assessment(laceration)  Last BM:  unknown  Height:   Ht Readings from Last 1 Encounters:  11/08/19 5\' 2"  (1.575 m)    Weight:   Wt Readings from Last 1 Encounters:  11/08/19 44.2 kg    Ideal Body Weight:  50 kg  BMI:  Body mass index is 17.82  kg/m.  Estimated Nutritional Needs:   Kcal:  1500-1700  Protein:  65-80 grams  Fluid:  > 1.5 L/day  Maylon Peppers RD, LDN, CNSC [phone] Pager [phone] After Hours Pager

## 2019-11-08 NOTE — Sepsis Progress Note (Signed)
Notified bedside nurse of need to draw repeat lactic acid 3rd  LA since pt LA still elevated< stated will notify the MD to get an order for the 3rd LA draw.Marland Kitchen

## 2019-11-08 NOTE — Progress Notes (Signed)
PROGRESS NOTE    Sue Schaefer  WLS:[id number] DOB: 12/23/1919 DOA: 11/06/2019 PCP: Dettinger, Fransisca Kaufmann, MD   Brief Narrative:  Sue Schaefer  is a 85 y.o. female, PMHx Dementia, HTN, vitamin D deficiency HLD  Presents to the ED today with difficulty breathing.  Patient is nonverbal at the time of my exam so history is collected from the medical record.  Patient was apparently tested positive for Covid 19 3 weeks ago.  From what I gather, she was asymptomatic at that time.  She was already on Breo and dexamethasone for lung disease.  Apparently at baseline patient is talkative and interactive, but today she started to have altered mental status, shortness of breath, and hypoxia.  When patient arrived to the ER she was ill-appearing with hypoxia and tachycardia.  Sepsis was suspected and broad-spectrum antibiotics were started with aztreonam and vancomycin.  Patient's family was contacted by Dr. Sabra Heck for CODE STATUS discussion and patient was made DNR.  Patient has been nonverbal during her entire visit in the ER.  ER provider reported that she would attempt to help with position changes, but was otherwise unresponsive.  At the time of my exam patient is still nonverbal, will not open her eyes to pain or voice, but does withdraw from pain.  ED course Temperature 100, pulse 139, respiratory rate 23, blood pressure 111/55 White blood cell count 21.6 CHEM panel: Sodium 148, creatinine 1.33-baseline 1.16 LDH 177, triglycerides 131, ferritin 539, CRP 20.1, lactic acid 4.2, pro-Cal 0.21, fibrinogen 460 Blood cultures drawn Urine culture collected Chest x-ray shows left lower lobe opacity EKG shows a heart rate of 145 and A. fib with RVR QTC is 441   Subjective: T-max overnight 38 C.  Somnolent does not react to painful stimuli, does not follow commands.  Unknown baseline.   Assessment & Plan:   Active Problems:   Dementia with behavioral disturbance (HCC)   Hyperlipidemia LDL goal <130    Coronary artery disease   Essential hypertension, benign   CAD (coronary artery disease) of artery bypass graft   COVID-19   Pneumonia due to COVID-19 virus   Covid pneumonia/acute respiratory failure with hypoxia COVID-19 Labs  Recent Labs    10/21/2019 1645  DDIMER >20.00*  FERRITIN 539*  LDH 177  CRP 20.1*   12/30 POC SARS coronavirus positive -Decadron 6 mg daily -Remdesivir per pharmacy protocol -12/31 Actemra x1 dose -Xopenex (SVT) -Vitamins per Covid protocol -Titrate O2 to maintain SPO2> 88% -Prone patient 16 hours/day; if cannot tolerate prone 2 to 3 hours per shift  Elevated D-dimer -12/31 CTA chest PE protocol pending -12/31 bilateral lower extremity Doppler pending -Increase heparin to PE/DVT therapeutic dose until scans return.  Bilateral PE Acute -Continue full dose heparin -CTA chest PE protocol reveals PE see results below  Acute DVT RIGHT leg -Multiple vessels thrombosed right leg see results below -Heparin drip  Sepsis CAP/lactic acidosis -Upon admission patient met criteria for sepsis HR> 90, RR> 20, site of infection lungs -Normal saline 24m/hr -Trend lactic acid and procalcitonin Results for SAASHA, DINA(MRN [id number] as of 11/08/2019 15:50  Ref. Range 10/31/2019 16:45 10/31/2019 18:43 11/08/2019 00:29 11/08/2019 06:05 11/08/2019 11:40  Lactic Acid, Venous Latest Ref Range: 0.5 - 1.9 mmol/L 4.2 (HH) 4.0 (HH) 3.1 (HH) 3.0 (HH) 3.3 ([facility]  Results for SADINE, HEIMANN(MRN [id number] as of 11/08/2019 15:50  Ref. Range 10/24/2019 16:45 11/08/2019 06:05 11/08/2019 11:40  Procalcitonin Latest Units: ng/mL 0.21 0.76 0.66    SVT -Most likely secondary  to patient's PE -Although no right heart strain seen on CTA chest obtain echocardiogram  Hypotension -Albumin 50 g -Normal saline 66QH/UT  Acute metabolic encephalopathy vs Dementia -Patient currently somnolent does not follow commands.  Unsure of baseline. -Multifactorial to include Covid  infection, PE, electrolyte abnormality.  Protein calorie malnutrition -NG tube placed -Feeding per ICU protocol    DVT prophylaxis: Heparin drip Code Status: DNR Family Communication: 12/31 spoke with Vaughan Basta (daughter) discussed plan of care answered all questions Disposition Plan: TBD   Consultants:    Procedures/Significant Events:  12/31 CTA chest PE protocol;-bilateral pulmonary emboli, most pronounced in the left lower lobe. -No evidence of right heart strain.  -There is reflux of contrast into the hepatic veins and IVC suggesting right heart dysfunction. -Small bilateral effusions. Consolidation in both lower lobes could reflect atelectasis or infiltrate/pneumonia. -CAD  -Mildly prominent mediastinal lymph nodes. Could be followed with repeat CT in 6 months. 12/31 bilateral lower extremity Doppler; Right:acute deep vein thrombosis involving the right common femoral vein, SF junction, right femoral vein, right proximal profunda vein, right popliteal vein, right posterior tibial veins, and right peroneal veins. Right EIV thrombosed, and CIV patent. Left: Negative for DVT 12/31 echocardiogram;Left Ventricle: EF= 50 to 55%.-Mild hypokinesis of the left ventricular, basal-mid inferior wall. The left ventricle -Left ventricular diastology could not be evaluated due to atrial fibrillation. Left ventricular diastolic function could not be evaluated. Right Ventricle: The right ventricular size is normal. No increase in right ventricular wall thickness. Global RV systolic function is has normal systolic function. The tricuspid regurgitant velocity is 2.46 m/s, and with an assumed right atrial pressure of 8 mmHg, the estimated right ventricular systolic pressure is mildly elevated at 32.3 mmHg. Left Atrium: Left atrial size was moderately dilated. Right Atrium: Right atrial size was mildly dilated Pericardium: There is no evidence of pericardial effusion. Mitral Valve: The mitral valve  is abnormal. There is mild thickening of the mitral valve leaflet(s). Mild mitral annular calcification. Moderate to severe mitral valve regurgitation. Tricuspid Valve: The tricuspid valve is abnormal. Tricuspid valve regurgitation moderate. There is a small mobile tricuspid valve vegetation or possibly thrombus. The TV vegetation measures 7 mm x 9 mm. Aortic Valve: The aortic valve is tricuspid. Aortic valve regurgitation is mild to moderate. Aortic regurgitation PHT measures 198 msec. Mild aortic stenosis is present. Aortic valve mean gradient measures 4.0 mmHg. Aortic valve peak gradient measures 7.3 mmHg. Aortic valve area, by VTI measures 1.46 cm. A mobile vegetation or thrombus is seen on an indeterminate cusp. The AoV vegetation measures 5 mm x 5 mm. Pulmonic Valve: The pulmonic valve was not well visualized. Pulmonic valve regurgitation is mild. Pulmonic regurgitation is mild. Aorta: The aortic root and ascending aorta are structurally normal, with no evidence of dilitation. Venous: The inferior vena cava is dilated in size with greater than 50% respiratory variability, suggesting right atrial pressure of 8 mmHg. IAS/Shunts: No atrial level shunt detected by color flow Doppler.   VENTILATOR SETTINGS: HFNC 12/31 Flow; 4 L/min FiO2; 95% SPO2; 97%   Cultures 12/30 POC SARS coronavirus positive     Antimicrobials: Anti-infectives (From admission, onward)   Start     Dose/Rate Stop   11/09/19 1700  vancomycin (VANCOREADY) IVPB 500 mg/100 mL     500 mg 100 mL/hr over 60 Minutes     11/09/19 1000  remdesivir 100 mg in sodium chloride 0.9 % 100 mL IVPB     100 mg 200 mL/hr over 30 Minutes 11/13/19  9604   11/08/19 1800  ceFEPIme (MAXIPIME) 2 g in sodium chloride 0.9 % 100 mL IVPB     2 g 200 mL/hr over 30 Minutes     11/08/19 0400  remdesivir 200 mg in sodium chloride 0.9% 250 mL IVPB     200 mg 580 mL/hr over 30 Minutes 11/08/19 0401   11/08/19 0100  aztreonam (AZACTAM)  injection 500 mg  Status:  Discontinued     500 mg 10/28/2019 2121   11/08/19 0100  aztreonam (AZACTAM) 0.5 g in dextrose 5 % 50 mL IVPB  Status:  Discontinued     0.5 g 100 mL/hr over 30 Minutes 11/08/19 1527   10/29/2019 1615  vancomycin (VANCOCIN) IVPB 1000 mg/200 mL premix     1,000 mg 200 mL/hr over 60 Minutes 10/13/2019 1809   10/20/2019 1615  aztreonam (AZACTAM) 2 g in sodium chloride 0.9 % 100 mL IVPB     2 g 200 mL/hr over 30 Minutes 10/11/2019 1707       Devices    LINES / TUBES:      Continuous Infusions: . aztreonam Stopped (11/08/19 0036)  . [START ON 11/09/2019] remdesivir 100 mg in NS 100 mL    . [START ON 11/09/2019] vancomycin       Objective: Vitals:   11/08/19 0415 11/08/19 0500 11/08/19 0600 11/08/19 0700  BP:  (!) 112/49 115/69 (!) 108/58  Pulse:      Resp:  16 17 16   Temp:      TempSrc:      SpO2: 96% 95% 97% 96%  Weight:      Height:        Intake/Output Summary (Last 24 hours) at 11/08/2019 0758 Last data filed at 11/08/2019 0700 Gross per 24 hour  Intake 1902.29 ml  Output 100 ml  Net 1802.29 ml   Filed Weights   10/20/2019 1620 11/08/19 0315  Weight: 43.1 kg 44.2 kg    Examination:  General: Somnolent, unresponsive to painful stimuli, positive acute respiratory distress, cachectic Eyes: negative scleral hemorrhage, negative anisocoria, negative icterus ENT: Negative Runny nose, negative gingival bleeding, Neck:  Negative scars, masses, torticollis, lymphadenopathy, JVD Lungs: Diffuse decreased breath sounds bilaterally without wheezes or crackles Cardiovascular: Tachycardic, without murmur gallop or rub normal S1 and S2 Abdomen: negative abdominal pain, nondistended, positive soft, bowel sounds, no rebound, no ascites, no appreciable mass Extremities: No significant cyanosis, clubbing, or edema bilateral lower extremities Skin: Negative rashes, lesions, ulcers Psychiatric: Unable to assess Central nervous system: Unable to assess .      Data Reviewed: Care during the described time interval was provided by me .  I have reviewed this patient's available data, including medical history, events of note, physical examination, and all test results as part of my evaluation.   CBC: Recent Labs  Lab 10/18/2019 1645 11/08/19 0605  WBC 21.6* 19.3*  NEUTROABS 19.7* 17.8*  HGB 12.9 11.2*  HCT 41.3 36.6  MCV 93.7 94.1  PLT 131* 540*   Basic Metabolic Panel: Recent Labs  Lab 10/17/2019 1645 11/06/2019 2203  NA 148* 147*  K 3.7 3.9  CL 114* 118*  CO2 19* 19*  GLUCOSE 132* 157*  BUN 48* 40*  CREATININE 1.33* 1.13*  CALCIUM 9.0 7.8*   GFR: Estimated Creatinine Clearance: 19.9 mL/min (A) (by C-G formula based on SCr of 1.13 mg/dL (H)). Liver Function Tests: Recent Labs  Lab 10/24/2019 1645  AST 15  ALT 16  ALKPHOS 66  BILITOT 1.4*  PROT 7.2  ALBUMIN 3.2*   No results for input(s): LIPASE, AMYLASE in the last 168 hours. No results for input(s): AMMONIA in the last 168 hours. Coagulation Profile: Recent Labs  Lab 10/14/2019 1645  INR 1.5*   Cardiac Enzymes: No results for input(s): CKTOTAL, CKMB, CKMBINDEX, TROPONINI in the last 168 hours. BNP (last 3 results) No results for input(s): PROBNP in the last 8760 hours. HbA1C: No results for input(s): HGBA1C in the last 72 hours. CBG: No results for input(s): GLUCAP in the last 168 hours. Lipid Profile: Recent Labs    10/10/2019 1646  TRIG 131   Thyroid Function Tests: No results for input(s): TSH, T4TOTAL, FREET4, T3FREE, THYROIDAB in the last 72 hours. Anemia Panel: Recent Labs    11/06/2019 1645  FERRITIN 539*   Urine analysis:    Component Value Date/Time   COLORURINE YELLOW 11/02/2019 1640   APPEARANCEUR HAZY (A) 11/06/2019 1640   APPEARANCEUR Clear 07/14/2018 1352   LABSPEC 1.017 11/05/2019 1640   PHURINE 5.0 10/23/2019 1640   GLUCOSEU NEGATIVE 10/22/2019 1640   HGBUR NEGATIVE 10/22/2019 1640   BILIRUBINUR NEGATIVE 11/06/2019 1640    BILIRUBINUR Negative 07/14/2018 1352   KETONESUR NEGATIVE 11/06/2019 1640   PROTEINUR NEGATIVE 10/18/2019 1640   UROBILINOGEN negative 01/06/2016 1638   NITRITE NEGATIVE 10/25/2019 1640   LEUKOCYTESUR NEGATIVE 10/24/2019 1640   Sepsis Labs: @LABRCNTIP (procalcitonin:4,lacticidven:4)  ) Recent Results (from the past 240 hour(s))  Blood Culture (routine x 2)     Status: None (Preliminary result)   Collection Time: 10/17/2019  4:18 PM   Specimen: Blood  Result Value Ref Range Status   Specimen Description RIGHT ANTECUBITAL  Final   Special Requests   Final    BOTTLES DRAWN AEROBIC AND ANAEROBIC Blood Culture adequate volume Performed at [facility], [address] [place], [place] [address]    Culture PENDING  Incomplete   Report Status PENDING  Incomplete  Blood Culture (routine x 2)     Status: None (Preliminary result)   Collection Time: 11/02/2019  4:45 PM   Specimen: Blood  Result Value Ref Range Status   Specimen Description LEFT ANTECUBITAL  Final   Special Requests   Final    BOTTLES DRAWN AEROBIC AND ANAEROBIC Blood Culture adequate volume Performed at [facility], [address], [place], [place] [address]    Culture PENDING  Incomplete   Report Status PENDING  Incomplete         Radiology Studies: DG Chest Port 1 View  Result Date: 10/26/2019 CLINICAL DATA:  Respiratory failure EXAM: PORTABLE CHEST 1 VIEW COMPARISON:  08/24/2017 FINDINGS: Increased interstitial markings. Mild left basilar/left lower lobe opacities. No pleural effusion or pneumothorax. The heart is normal in size.  Thoracic aortic atherosclerosis. Bilateral rib fracture deformities, chronic. IMPRESSION: Left lower lobe opacity with increased interstitial markings, raising concern for possible pneumonia. Electronically Signed   By: Julian Hy M.D.   On: 10/20/2019 16:59        Scheduled Meds: . amLODipine  5 mg Oral Daily  . vitamin C  500 mg Oral Daily  . aspirin  81 mg Oral Daily  .  atenolol  25 mg Oral Daily  . Chlorhexidine Gluconate Cloth  6 each Topical Daily  . dexamethasone (DECADRON) injection  6 mg Intravenous Q24H  . docusate sodium  100 mg Oral BID  . donepezil  10 mg Oral QHS  . heparin  5,000 Units Subcutaneous Q8H  . isosorbide mononitrate  30 mg Oral Daily  . mouth rinse  15 mL Mouth Rinse BID  . memantine  14 mg Oral Daily  . mirabegron ER  25 mg Oral QHS  . tiZANidine  2 mg Oral Once  . zinc sulfate  220 mg Oral Daily   Continuous Infusions: . aztreonam Stopped (11/08/19 0036)  . [START ON 11/09/2019] remdesivir 100 mg in NS 100 mL    . [START ON 11/09/2019] vancomycin       LOS: 1 day   The patient is critically ill with multiple organ systems failure and requires high complexity decision making for assessment and support, frequent evaluation and titration of therapies, application of advanced monitoring technologies and extensive interpretation of multiple databases. Critical Care Time devoted to patient care services described in this note  Time spent: 40 minutes     Weslie Pretlow, Geraldo Docker, MD [facility] Pager [phone]  If 7PM-7AM, please contact night-coverage [email or web address] Password [email or web address] 11/08/2019, 7:58 AM

## 2019-11-08 NOTE — Progress Notes (Signed)
  Echocardiogram 2D Echocardiogram has been performed.  Bobbye Charleston 11/08/2019, 3:00 PM

## 2019-11-08 NOTE — Progress Notes (Signed)
Pharmacy Antibiotic Note  Sue Schaefer is a 85 y.o. female admitted on 10/12/2019 with pneumonia.  Pharmacy has been consulted for cefepime and vanomycin dosing.  Presenting with difficulty breathing and AMS - found to be COVID + PCT 0.21>0.66 Scr 1.33 > 0.92 (CrCl 24 mL/min).   Antibiotic allergies - Erythromycin reaction: "burned all the way through" - Keflex reaction: itching  Plan: Vancomycin 1 g IV once then 500 mg IV every 48 hours (estAUC 446)  Change Aztreonam to Cefepime 2g IV q24h. Monitor renal fx, cx results, clinical pic, and vanc levels as appropriate  Height: 5\' 2"  (157.5 cm) Weight: 97 lb 7.1 oz (44.2 kg) IBW/kg (Calculated) : 50.1  Temp (24hrs), Avg:98.6 F (37 C), Min:96.6 F (35.9 C), Max:100.4 F (38 C)  Recent Labs  Lab 10/21/2019 1645 10/22/2019 1843 11/06/2019 2203 11/08/19 0029 11/08/19 0605 11/08/19 1140  WBC 21.6*  --   --   --  19.3*  --   CREATININE 1.33*  --  1.13*  --  0.92  --   LATICACIDVEN 4.2* 4.0*  --  3.1* 3.0* 3.3*    Estimated Creatinine Clearance: 24.4 mL/min (by C-G formula based on SCr of 0.92 mg/dL).    Allergies  Allergen Reactions  . Erythromycin Base Other (See Comments)    mycins"- "burned all the way through"  . Keflex [Cephalexin] Itching  . Other Itching    Mycins made patient itch  . Penicillins Cross Reactors     Patient is allergic to ALL [facility] DRUGS!!!  . Sulfamethoxazole Rash    unknown    Antimicrobials this admission: Vancomycin 12/30>> Aztreonam 12/30>>12/31 Remdesivir 12/31>>1/4 Actemra 12/31 Cefepime 12/31 >   Dose adjustments this admission:  Microbiology results: 12/30 BCx: ngtd 12/30 UCx: sent  12/31 MRSA PCR: negative  Thank you for allowing pharmacy to be a part of this patient's care.  Gretta Arab PharmD, BCPS Clinical pharmacist phone 7am- 5pm: [phone] 11/08/2019 3:01 PM

## 2019-11-09 ENCOUNTER — Inpatient Hospital Stay (HOSPITAL_COMMUNITY): Payer: Medicare HMO

## 2019-11-09 DIAGNOSIS — E785 Hyperlipidemia, unspecified: Secondary | ICD-10-CM

## 2019-11-09 DIAGNOSIS — I358 Other nonrheumatic aortic valve disorders: Secondary | ICD-10-CM

## 2019-11-09 LAB — CBC WITH DIFFERENTIAL/PLATELET
Abs Immature Granulocytes: 0.17 10*3/uL — ABNORMAL HIGH (ref 0.00–0.07)
Basophils Absolute: 0 10*3/uL (ref 0.0–0.1)
Basophils Relative: 0 %
Eosinophils Absolute: 0 10*3/uL (ref 0.0–0.5)
Eosinophils Relative: 0 %
HCT: 31.6 % — ABNORMAL LOW (ref 36.0–46.0)
Hemoglobin: 9.9 g/dL — ABNORMAL LOW (ref 12.0–15.0)
Immature Granulocytes: 1 %
Lymphocytes Relative: 4 %
Lymphs Abs: 0.7 10*3/uL (ref 0.7–4.0)
MCH: 29.6 pg (ref 26.0–34.0)
MCHC: 31.3 g/dL (ref 30.0–36.0)
MCV: 94.3 fL (ref 80.0–100.0)
Monocytes Absolute: 0.4 10*3/uL (ref 0.1–1.0)
Monocytes Relative: 3 %
Neutro Abs: 14.1 10*3/uL — ABNORMAL HIGH (ref 1.7–7.7)
Neutrophils Relative %: 92 %
Platelets: 105 10*3/uL — ABNORMAL LOW (ref 150–400)
RBC: 3.35 MIL/uL — ABNORMAL LOW (ref 3.87–5.11)
RDW: 14.9 % (ref 11.5–15.5)
WBC: 15.3 10*3/uL — ABNORMAL HIGH (ref 4.0–10.5)
nRBC: 0.5 % — ABNORMAL HIGH (ref 0.0–0.2)

## 2019-11-09 LAB — GLUCOSE, CAPILLARY
Glucose-Capillary: 217 mg/dL — ABNORMAL HIGH (ref 70–99)
Glucose-Capillary: 141 mg/dL — ABNORMAL HIGH (ref 70–99)

## 2019-11-09 LAB — COMPREHENSIVE METABOLIC PANEL
ALT: 45 U/L — ABNORMAL HIGH (ref 0–44)
AST: 60 U/L — ABNORMAL HIGH (ref 15–41)
Albumin: 3.3 g/dL — ABNORMAL LOW (ref 3.5–5.0)
Alkaline Phosphatase: 46 U/L (ref 38–126)
Anion gap: 10 (ref 5–15)
BUN: 46 mg/dL — ABNORMAL HIGH (ref 8–23)
CO2: 16 mmol/L — ABNORMAL LOW (ref 22–32)
Calcium: 7.8 mg/dL — ABNORMAL LOW (ref 8.9–10.3)
Chloride: 122 mmol/L — ABNORMAL HIGH (ref 98–111)
Creatinine, Ser: 0.78 mg/dL (ref 0.44–1.00)
GFR calc Af Amer: >60 mL/min (ref >60)
GFR calc non Af Amer: >60 mL/min (ref >60)
Glucose, Bld: 219 mg/dL — ABNORMAL HIGH (ref 70–99)
Potassium: 3.2 mmol/L — ABNORMAL LOW (ref 3.5–5.1)
Sodium: 148 mmol/L — ABNORMAL HIGH (ref 135–145)
Total Bilirubin: 1.2 mg/dL (ref 0.3–1.2)
Total Protein: 5.9 g/dL — ABNORMAL LOW (ref 6.5–8.1)

## 2019-11-09 LAB — HEMOGLOBIN A1C
Hgb A1c MFr Bld: 5.4 % (ref 4.8–5.6)
Mean Plasma Glucose: 108.28 mg/dL

## 2019-11-09 LAB — PHOSPHORUS: Phosphorus: 1.7 mg/dL — ABNORMAL LOW (ref 2.5–4.6)

## 2019-11-09 LAB — C-REACTIVE PROTEIN: CRP: 15.3 mg/dL — ABNORMAL HIGH (ref <1.0)

## 2019-11-09 LAB — HEPARIN LEVEL (UNFRACTIONATED)
Heparin Unfractionated: 0.34 IU/mL (ref 0.30–0.70)
Heparin Unfractionated: 0.26 IU/mL — ABNORMAL LOW (ref 0.30–0.70)
Heparin Unfractionated: 0.4 IU/mL (ref 0.30–0.70)

## 2019-11-09 LAB — D-DIMER, QUANTITATIVE: D-Dimer, Quant: >20 ug/mL-FEU — ABNORMAL HIGH (ref 0.00–0.50)

## 2019-11-09 LAB — MAGNESIUM: Magnesium: 2.2 mg/dL (ref 1.7–2.4)

## 2019-11-09 LAB — PROCALCITONIN: Procalcitonin: 0.62 ng/mL

## 2019-11-09 LAB — FERRITIN: Ferritin: 963 ng/mL — ABNORMAL HIGH (ref 11–307)

## 2019-11-09 MED ORDER — POTASSIUM CHLORIDE 10 MEQ/100ML IV SOLN
10.0000 meq | INTRAVENOUS | Status: AC
  Administered 2019-11-09 (×5): 10 meq via INTRAVENOUS
  Filled 2019-11-09 (×2): qty 100

## 2019-11-09 MED ORDER — DEXTROSE 5 % IV SOLN: INTRAVENOUS | Status: DC

## 2019-11-09 MED ORDER — ATENOLOL 25 MG PO TABS
25.0000 mg | ORAL_TABLET | Freq: Every day | ORAL | Status: DC
  Administered 2019-11-10 – 2019-11-14 (×5): 25 mg
  Filled 2019-11-09 (×7): qty 1

## 2019-11-09 MED ORDER — DONEPEZIL HCL 10 MG PO TABS
10.0000 mg | ORAL_TABLET | Freq: Every day | ORAL | Status: DC
  Administered 2019-11-10 – 2019-11-13 (×4): 10 mg
  Filled 2019-11-09 (×6): qty 1

## 2019-11-09 MED ORDER — MEMANTINE HCL 10 MG PO TABS
10.0000 mg | ORAL_TABLET | Freq: Every day | ORAL | Status: DC
  Administered 2019-11-09 – 2019-11-14 (×6): 10 mg
  Filled 2019-11-09 (×8): qty 1

## 2019-11-09 MED ORDER — ZINC SULFATE 220 (50 ZN) MG PO CAPS
220.0000 mg | ORAL_CAPSULE | Freq: Every day | ORAL | Status: DC
  Administered 2019-11-10 – 2019-11-14 (×5): 220 mg
  Filled 2019-11-09 (×5): qty 1

## 2019-11-09 MED ORDER — GUAIFENESIN-DM 100-10 MG/5ML PO SYRP
10.0000 mL | ORAL_SOLUTION | ORAL | Status: DC | PRN
  Administered 2019-11-12: 10 mL
  Filled 2019-11-09: qty 10

## 2019-11-09 MED ORDER — POTASSIUM PHOSPHATE MONOBASIC 500 MG PO TABS
500.0000 mg | ORAL_TABLET | Freq: Four times a day (QID) | ORAL | Status: AC
  Administered 2019-11-09 (×3): 500 mg
  Filled 2019-11-09 (×5): qty 1

## 2019-11-09 MED ORDER — HEPARIN (PORCINE) 25000 UT/250ML-% IV SOLN
600.0000 [IU]/h | INTRAVENOUS | Status: DC
  Administered 2019-11-09 – 2019-11-11 (×3): 850 [IU]/h via INTRAVENOUS
  Administered 2019-11-13: 500 [IU]/h via INTRAVENOUS
  Administered 2019-11-14: 750 [IU]/h via INTRAVENOUS
  Filled 2019-11-09 (×5): qty 250

## 2019-11-09 MED ORDER — ASPIRIN 81 MG PO CHEW
81.0000 mg | CHEWABLE_TABLET | Freq: Every day | ORAL | Status: DC
  Administered 2019-11-10 – 2019-11-12 (×3): 81 mg
  Filled 2019-11-09 (×3): qty 1

## 2019-11-09 MED ORDER — DOCUSATE SODIUM 50 MG/5ML PO LIQD
100.0000 mg | Freq: Two times a day (BID) | ORAL | Status: DC
  Administered 2019-11-10 – 2019-11-14 (×9): 100 mg
  Filled 2019-11-09 (×10): qty 10

## 2019-11-09 MED ORDER — INSULIN ASPART 100 UNIT/ML ~~LOC~~ SOLN
0.0000 [IU] | SUBCUTANEOUS | Status: DC
  Administered 2019-11-09: 1 [IU] via SUBCUTANEOUS
  Administered 2019-11-10: 5 [IU] via SUBCUTANEOUS
  Administered 2019-11-10: 3 [IU] via SUBCUTANEOUS
  Administered 2019-11-10 (×2): 2 [IU] via SUBCUTANEOUS
  Administered 2019-11-10 (×2): 5 [IU] via SUBCUTANEOUS
  Administered 2019-11-10 – 2019-11-12 (×8): 2 [IU] via SUBCUTANEOUS
  Administered 2019-11-12: 3 [IU] via SUBCUTANEOUS
  Administered 2019-11-13: 1 [IU] via SUBCUTANEOUS
  Administered 2019-11-13: 2 [IU] via SUBCUTANEOUS
  Administered 2019-11-13: 1 [IU] via SUBCUTANEOUS
  Administered 2019-11-13: 2 [IU] via SUBCUTANEOUS
  Administered 2019-11-13: 1 [IU] via SUBCUTANEOUS
  Administered 2019-11-14: 2 [IU] via SUBCUTANEOUS
  Administered 2019-11-14: 1 [IU] via SUBCUTANEOUS
  Administered 2019-11-14 (×3): 2 [IU] via SUBCUTANEOUS
  Administered 2019-11-15: 3 [IU] via SUBCUTANEOUS
  Administered 2019-11-15: 2 [IU] via SUBCUTANEOUS

## 2019-11-09 MED ORDER — ASCORBIC ACID 500 MG PO TABS
500.0000 mg | ORAL_TABLET | Freq: Every day | ORAL | Status: DC
  Administered 2019-11-10 – 2019-11-14 (×5): 500 mg
  Filled 2019-11-09 (×5): qty 1

## 2019-11-09 NOTE — Progress Notes (Signed)
ANTICOAGULATION CONSULT NOTE - Follow Up Consult  Pharmacy Consult for Heparin Indication: bilateral PE and RLE DVT  Allergies  Allergen Reactions  . Erythromycin Base Other (See Comments)    mycins"- "burned all the way through"  . Keflex [Cephalexin] Itching  . Other Itching    Mycins made patient itch  . Penicillins Cross Reactors     Patient is allergic to ALL MYCINS DRUGS!!!  . Sulfamethoxazole Rash    unknown    Patient Measurements: Height: 5\' 2"  (157.5 cm) Weight: 97 lb 7.1 oz (44.2 kg) IBW/kg (Calculated) : 50.1 Heparin Dosing Weight: TBW  Vital Signs: Temp: 98 F (36.7 C) (01/01 0400) BP: 123/56 (01/01 0400) Pulse Rate: 96 (01/01 0400)  Labs: Recent Labs    11/06/2019 1645 11/08/2019 2203 11/08/19 0605 11/08/19 1005 11/08/19 1140 11/08/19 1655 11/09/19 0320  HGB 12.9  --  11.2*  --   --   --  9.9*  HCT 41.3  --  36.6  --   --   --  31.6*  PLT 131*  --  100*  --   --   --  105*  APTT 30  --   --   --   --   --   --   LABPROT 18.3*  --   --   --   --   --   --   INR 1.5*  --   --   --   --   --   --   HEPARINUNFRC  --   --   --   --   --  0.18* 0.34  CREATININE 1.33* 1.13* 0.92  --   --   --  0.78  TROPONINIHS  --   --   --  16 17  --   --     Estimated Creatinine Clearance: 28 mL/min (by C-G formula based on SCr of 0.78 mg/dL).   Medications:  Scheduled:  . vitamin C  500 mg Oral Daily  . aspirin  81 mg Oral Daily  . atenolol  25 mg Oral Daily  . Chlorhexidine Gluconate Cloth  6 each Topical Daily  . dexamethasone (DECADRON) injection  6 mg Intravenous Q24H  . docusate sodium  100 mg Oral BID  . donepezil  10 mg Oral QHS  . insulin aspart  0-5 Units Subcutaneous QHS  . insulin aspart  0-9 Units Subcutaneous TID WC  . isosorbide mononitrate  30 mg Oral Daily  . mouth rinse  15 mL Mouth Rinse BID  . memantine  14 mg Oral Daily  . mirabegron ER  25 mg Oral QHS  . tiZANidine  2 mg Oral Once  . zinc sulfate  220 mg Oral Daily   Infusions:  .  sodium chloride 75 mL/hr at 11/08/19 1735  . ceFEPime (MAXIPIME) IV 2 g (11/08/19 1754)  . diltiazem (CARDIZEM) infusion Stopped (11/08/19 1506)  . feeding supplement (OSMOLITE 1.2 CAL)    . feeding supplement (VITAL HIGH PROTEIN) 1,000 mL (11/08/19 2057)  . heparin 750 Units/hr (11/08/19 1854)  . potassium chloride    . remdesivir 100 mg in NS 100 mL    . vancomycin      Assessment: 83 yoF admitted on 12/30 with COVID-19 pneumonia.  She was initially started on Heparin [place] for VTE prophylaxis.  Pharmacy is now consulted to change to IV heparin for VTE. Most recent dose Heparin 5000 units [employer] TID - on 12/31 at 06:00 CTa +bilateral PE, No evidence of  right heart strain Dopplers: Right: acute DVT involving the right common femoral vein, SF junction, femoral vein, proximal profunda vein, popliteal vein, right posterior tibial veins, and right peroneal veins  Today, 11/09/2019: Confirmatory Heparin level decreased to 0.26, subtherapeutic on heparin 750 units/hr SCr improving 1.3 > 0.92 > 0.78 CBC: Hgb decreasing to 9.9 (baseline 12.9), Plt low/stable at 105k (baseline 131) Bleeding: RN reports IV site bled when IV was removed on 12/31, dressing was applied and bleeding stopped.  Then about an hour later it had another large bleed. Initially controlled, then bleeding recurred. Thrombipad now applied, MD aware.  No further bleeding or complications reported on 1/1.   Goal of Therapy:  Heparin level 0.3-0.7 units/ml Monitor platelets by anticoagulation protocol: Yes   Plan:   Increase to heparin IV infusion to 850 units/hr   Heparin level 8 hours after rate change.  Daily heparin level and CBC  Continue to monitor H&H and platelets   Gretta Arab PharmD, BCPS Clinical pharmacist phone 7am- 5pm: [phone] 11/09/2019 9:04 AM

## 2019-11-09 NOTE — Progress Notes (Signed)
ANTICOAGULATION CONSULT NOTE - Follow Up Consult  Pharmacy Consult for Heparin Indication: r/o VTE  Allergies  Allergen Reactions  . Erythromycin Base Other (See Comments)    mycins"- "burned all the way through"  . Keflex [Cephalexin] Itching  . Other Itching    Mycins made patient itch  . Penicillins Cross Reactors     Patient is allergic to ALL MYCINS DRUGS!!!  . Sulfamethoxazole Rash    unknown    Patient Measurements: Height: 5\' 2"  (157.5 cm) Weight: 97 lb 7.1 oz (44.2 kg) IBW/kg (Calculated) : 50.1 Heparin Dosing Weight: TBW  Vital Signs: Temp: 98 F (36.7 C) (01/01 0400) BP: 123/56 (01/01 0400) Pulse Rate: 96 (01/01 0400)  Labs: Recent Labs    10/20/2019 1645 10/23/2019 2203 11/08/19 0605 11/08/19 1005 11/08/19 1140 11/08/19 1655 11/09/19 0320  HGB 12.9  --  11.2*  --   --   --  9.9*  HCT 41.3  --  36.6  --   --   --  31.6*  PLT 131*  --  100*  --   --   --  105*  APTT 30  --   --   --   --   --   --   LABPROT 18.3*  --   --   --   --   --   --   INR 1.5*  --   --   --   --   --   --   HEPARINUNFRC  --   --   --   --   --  0.18* 0.34  CREATININE 1.33* 1.13* 0.92  --   --   --   --   TROPONINIHS  --   --   --  16 17  --   --      Assessment: 77 yoF admitted on 12/30 with COVID-19 pneumonia.  She was initially started on Heparin [place] for VTE prophylaxis.  Pharmacy is now consulted to change to IV heparin for r/o VTE.   Baseline coags 12/30 were WNL except INR 1.5.  APTT was 30 Most recent dose Heparin 5000 units  TID - on 12/31 at 06:00   11/09/19 0440 UPDATE Heparin level: 0.34 IU/mL, within therapeutic goal range RN reports no issues with bleeding  Hb has dropped to 9.9, possibly due to bleed from arm yesterday afternoon D-Dimer remains >20 and platelets now 105K  Goal of Therapy:  Heparin level 0.3-0.7 units/ml Monitor platelets by anticoagulation protocol: Yes   Plan:   Continue  heparin   infusion rate at 750 units/hr   Confirmatory heparin  level in ~8 hours   Daily heparin level and CBC  Continue to monitor H&H and platelets  Despina Pole, Pharm. D. Clinical Pharmacist 11/09/2019 4:47 AM

## 2019-11-09 NOTE — Progress Notes (Addendum)
Contacted D. Olevia Bowens MD via secure chat to clarify CBG orders if he wanted ACHS or Q4 hr CBGs since patient is on continuous tube feeds. Will continue to monitor  @ 1950 D. Olevia Bowens MD placed orders for Q4 CBGs  @2130  Contacted D. Olevia Bowens MD about patient pulling out NG tube. Asked MD if he would like this nurse to place a new one. Will continue to monitor.   @2135  D. Olevia Bowens stated in the secure chat to go ahead and place another NG and to put in an abd Xray for confirmation of placement. Will continue to monitor.

## 2019-11-09 NOTE — Progress Notes (Signed)
PROGRESS NOTE    Sue Schaefer  IYM:[id number] DOB: 13-Oct-1920 DOA: 11/04/2019 PCP: Dettinger, Fransisca Kaufmann, MD   Brief Narrative:  Sue Schaefer  is a 86 y.o. female, PMHx Dementia, HTN, vitamin D deficiency HLD  Presents to the ED today with difficulty breathing.  Patient is nonverbal at the time of my exam so history is collected from the medical record.  Patient was apparently tested positive for Covid 19 3 weeks ago.  From what I gather, she was asymptomatic at that time.  She was already on Breo and dexamethasone for lung disease.  Apparently at baseline patient is talkative and interactive, but today she started to have altered mental status, shortness of breath, and hypoxia.  When patient arrived to the ER she was ill-appearing with hypoxia and tachycardia.  Sepsis was suspected and broad-spectrum antibiotics were started with aztreonam and vancomycin.  Patient's family was contacted by Dr. Sabra Heck for CODE STATUS discussion and patient was made DNR.  Patient has been nonverbal during her entire visit in the ER.  ER provider reported that she would attempt to help with position changes, but was otherwise unresponsive.  At the time of my exam patient is still nonverbal, will not open her eyes to pain or voice, but does withdraw from pain.  ED course Temperature 100, pulse 139, respiratory rate 23, blood pressure 111/55 White blood cell count 21.6 CHEM panel: Sodium 148, creatinine 1.33-baseline 1.16 LDH 177, triglycerides 131, ferritin 539, CRP 20.1, lactic acid 4.2, pro-Cal 0.21, fibrinogen 460 Blood cultures drawn Urine culture collected Chest x-ray shows left lower lobe opacity EKG shows a heart rate of 145 and A. fib with RVR QTC is 441   Subjective: 1/1 last 24 hours afebrile opens eyes to stimulation, nods yes to all questions.  Does not follow commands.    Assessment & Plan:   Active Problems:   Dementia with behavioral disturbance (HCC)   Hyperlipidemia LDL goal <130    Coronary artery disease   Essential hypertension, benign   CAD (coronary artery disease) of artery bypass graft   COVID-19   Pneumonia due to COVID-19 virus   Covid pneumonia/acute respiratory failure with hypoxia COVID-19 Labs  Recent Labs    10/20/2019 1645 11/08/19 0605 11/09/19 0320  DDIMER >20.00* >20.00* >20.00*  FERRITIN 539* 593* 963*  LDH 177  --   --   CRP 20.1* 21.8* 15.3*   12/30 POC SARS coronavirus positive  -Decadron 6 mg daily -Remdesivir per pharmacy protocol -12/31 Actemra x1 dose -Xopenex (SVT) -Vitamins per Covid protocol -Titrate O2 to maintain SPO2> 88% -Prone patient 16 hours/day; if cannot tolerate prone 2 to 3 hours per shift  Elevated D-dimer -12/31 CTA chest PE protocol pending -12/31 bilateral lower extremity Doppler pending  Bilateral PE Acute -Continue full dose heparin -CTA chest PE protocol reveals PE see results below  Acute DVT RIGHT leg -Multiple vessels thrombosed right leg see results below -Heparin drip  Sepsis CAP/lactic acidosis -Upon admission patient met criteria for sepsis HR> 90, RR> 20, site of infection lungs -Trend lactic acid and procalcitonin Results for Sue, Schaefer (MRN [id number]) as of 11/08/2019 15:50  Ref. Range 10/16/2019 16:45 10/25/2019 18:43 11/08/2019 00:29 11/08/2019 06:05 11/08/2019 11:40  Lactic Acid, Venous Latest Ref Range: 0.5 - 1.9 mmol/L 4.2 (HH) 4.0 (HH) 3.1 (HH) 3.0 (HH) 3.3 [facility])  Results for Sue, Schaefer (MRN [id number]) as of 11/08/2019 15:50  Ref. Range 10/29/2019 16:45 11/08/2019 06:05 11/08/2019 11:40  Procalcitonin Latest Units: ng/mL 0.21 0.76 0.66  Endocarditis -Phone consult with Dr. Domenic Polite cardiology concurs to continue current antibiotic treatment until blood cultures finalized. -Recommend NOT TO obtain TEE given patient's tenuous status and the fact that she is not a surgical candidate.  SVT -Most likely secondary to patient's PE -Although no right heart strain seen on CTA chest  obtain echocardiogram  Hypotension -Albumin 50 g -1/1 D5W 45XM/IW  Acute metabolic encephalopathy vs Dementia -Patient currently somnolent does not follow commands.  Unsure of baseline. -Multifactorial to include Covid infection, PE, DVT, endocarditis, electrolyte abnormality.  Protein calorie malnutrition -NG tube placed -Feeding per ICU protocol  Hypokalemia -Potassium goal> 4 -Potassium IV 50 mEq  Hypernatremia -DC normal saline -1/1 D5W 79m/hr  Hypophosphatemia -1/1 replaced by pharmacy    DVT prophylaxis: Heparin drip Code Status: DNR Family Communication: 12/31 spoke with LVaughan Basta(daughter) discussed plan of care answered all questions Disposition Plan: TBD   Consultants:  Phone consult with Dr. MDomenic Politecardiology    Procedures/Significant Events:  12/31 CTA chest PE protocol;-bilateral pulmonary emboli, most pronounced in the left lower lobe. -No evidence of right heart strain.  -There is reflux of contrast into the hepatic veins and IVC suggesting right heart dysfunction. -Small bilateral effusions. Consolidation in both lower lobes could reflect atelectasis or infiltrate/pneumonia. -CAD  -Mildly prominent mediastinal lymph nodes. Could be followed with repeat CT in 6 months. 12/31 bilateral lower extremity Doppler; Right:acute deep vein thrombosis involving the right common femoral vein, SF junction, right femoral vein, right proximal profunda vein, right popliteal vein, right posterior tibial veins, and right peroneal veins. Right EIV thrombosed, and CIV patent. Left: Negative for DVT 12/31 echocardiogram;Left Ventricle: EF= 50 to 55%.-Mild hypokinesis of the left ventricular, basal-mid inferior wall. The left ventricle -Left ventricular diastology could not be evaluated due to atrial fibrillation. Left ventricular diastolic function could not be evaluated. Left Atrium:  moderately dilated. Mitral Valve: Moderate to severe mitral valve  regurgitation. Tricuspid Valve: regurgitation moderate. There is a small mobile tricuspid valve vegetation or possibly thrombus. . Aortic Valve:mild to moderate. A mobile vegetation or thrombus is seen on an indeterminate cusp. The AoV vegetation measures 5 mm x 5 mm.   VENTILATOR SETTINGS: HFNC 1/1  flow; 2 L/min SPO2; 95%   Cultures 12/30 POC SARS coronavirus positive 12/30 RIGHT AC NGTD 12/30 LEFT AC NGTD 12/30 urine negative 12/31 MRSA by PCR negative    Antimicrobials: Anti-infectives (From admission, onward)   Start     Dose/Rate Stop   11/09/19 1700  vancomycin (VANCOREADY) IVPB 500 mg/100 mL     500 mg 100 mL/hr over 60 Minutes     11/09/19 1000  remdesivir 100 mg in sodium chloride 0.9 % 100 mL IVPB     100 mg 200 mL/hr over 30 Minutes 11/13/19 0959   11/08/19 1800  ceFEPIme (MAXIPIME) 2 g in sodium chloride 0.9 % 100 mL IVPB     2 g 200 mL/hr over 30 Minutes     11/08/19 0400  remdesivir 200 mg in sodium chloride 0.9% 250 mL IVPB     200 mg 580 mL/hr over 30 Minutes 11/08/19 0401   11/08/19 0100  aztreonam (AZACTAM) injection 500 mg  Status:  Discontinued     500 mg 11/08/2019 2121   11/08/19 0100  aztreonam (AZACTAM) 0.5 g in dextrose 5 % 50 mL IVPB  Status:  Discontinued     0.5 g 100 mL/hr over 30 Minutes 11/08/19 1527   10/24/2019 1615  vancomycin (VANCOCIN) IVPB 1000 mg/200 mL premix  1,000 mg 200 mL/hr over 60 Minutes 10/18/2019 1809   10/27/2019 1615  aztreonam (AZACTAM) 2 g in sodium chloride 0.9 % 100 mL IVPB     2 g 200 mL/hr over 30 Minutes 10/24/2019 1707       Devices    LINES / TUBES:      Continuous Infusions: . sodium chloride 75 mL/hr at 11/08/19 1735  . ceFEPime (MAXIPIME) IV 2 g (11/08/19 1754)  . diltiazem (CARDIZEM) infusion Stopped (11/08/19 1506)  . feeding supplement (OSMOLITE 1.2 CAL)    . feeding supplement (VITAL HIGH PROTEIN) 1,000 mL (11/08/19 2057)  . heparin 750 Units/hr (11/08/19 1854)  . remdesivir 100 mg in NS 100  mL    . vancomycin       Objective: Vitals:   11/09/19 0100 11/09/19 0200 11/09/19 0300 11/09/19 0400  BP: (!) 113/49 (!) 115/50 (!) 116/50 (!) 123/56  Pulse: (!) 101 92 94 96  Resp: (!) 22 20 (!) 21 (!) 21  Temp:    98 F (36.7 C)  TempSrc:      SpO2: 97% 96% 97% 96%  Weight:      Height:        Intake/Output Summary (Last 24 hours) at 11/09/2019 0839 Last data filed at 11/09/2019 0400 Gross per 24 hour  Intake 600.95 ml  Output 850 ml  Net -249.05 ml   Filed Weights   10/09/2019 1620 11/08/19 0315  Weight: 43.1 kg 44.2 kg   Physical Exam:  General: Opens eyes to stimulation, follows no commands, nods yes to all questions, positive acute respiratory distress, cachectic Eyes: negative scleral hemorrhage, negative anisocoria, negative icterus ENT: Negative Runny nose, negative gingival bleeding, Neck:  Negative scars, masses, torticollis, lymphadenopathy, JVD Lungs: Tachypneic rhonchi bilaterally without wheezes or crackles Cardiovascular: Tachycardic without murmur gallop or rub normal S1 and S2 Abdomen: negative abdominal pain, nondistended, positive soft, bowel sounds, no rebound, no ascites, no appreciable mass Extremities: No significant cyanosis, clubbing, or edema bilateral lower extremities Skin: Negative rashes, lesions, ulcers Psychiatric: Unable to evaluate Central nervous system: Unable to evaluate        Data Reviewed: Care during the described time interval was provided by me .  I have reviewed this patient's available data, including medical history, events of note, physical examination, and all test results as part of my evaluation.   CBC: Recent Labs  Lab 10/23/2019 1645 11/08/19 0605 11/09/19 0320  WBC 21.6* 19.3* 15.3*  NEUTROABS 19.7* 17.8* 14.1*  HGB 12.9 11.2* 9.9*  HCT 41.3 36.6 31.6*  MCV 93.7 94.1 94.3  PLT 131* 100* 812*   Basic Metabolic Panel: Recent Labs  Lab 11/06/2019 1645 10/14/2019 2203 11/08/19 0605 11/08/19 1655 11/09/19 0320   NA 148* 147* 149*  --  148*  K 3.7 3.9 3.9  --  3.2*  CL 114* 118* 117*  --  122*  CO2 19* 19* 18*  --  16*  GLUCOSE 132* 157* 165*  --  219*  BUN 48* 40* 35*  --  46*  CREATININE 1.33* 1.13* 0.92  --  0.78  CALCIUM 9.0 7.8* 7.6*  --  7.8*  MG  --   --  2.1 2.1 2.2  PHOS  --   --  2.3* 2.0* 1.7*   GFR: Estimated Creatinine Clearance: 28 mL/min (by C-G formula based on SCr of 0.78 mg/dL). Liver Function Tests: Recent Labs  Lab 10/27/2019 1645 11/08/19 0605 11/09/19 0320  AST 15 19 60*  ALT 16 15 45*  ALKPHOS 66 57 46  BILITOT 1.4* 1.1 1.2  PROT 7.2 5.9* 5.9*  ALBUMIN 3.2* 2.5* 3.3*   No results for input(s): LIPASE, AMYLASE in the last 168 hours. No results for input(s): AMMONIA in the last 168 hours. Coagulation Profile: Recent Labs  Lab 10/13/2019 1645  INR 1.5*   Cardiac Enzymes: No results for input(s): CKTOTAL, CKMB, CKMBINDEX, TROPONINI in the last 168 hours. BNP (last 3 results) No results for input(s): PROBNP in the last 8760 hours. HbA1C: Recent Labs    11/09/19 0320  HGBA1C 5.4   CBG: Recent Labs  Lab 11/08/19 1140 11/08/19 1617  GLUCAP 127* 120*   Lipid Profile: Recent Labs    11/02/2019 1646  TRIG 131   Thyroid Function Tests: No results for input(s): TSH, T4TOTAL, FREET4, T3FREE, THYROIDAB in the last 72 hours. Anemia Panel: Recent Labs    11/08/19 0605 11/09/19 0320  FERRITIN 593* 963*   Urine analysis:    Component Value Date/Time   COLORURINE YELLOW 10/26/2019 1640   APPEARANCEUR HAZY (A) 10/16/2019 1640   APPEARANCEUR Clear 07/14/2018 1352   LABSPEC 1.017 10/18/2019 1640   PHURINE 5.0 10/23/2019 1640   GLUCOSEU NEGATIVE 10/30/2019 1640   HGBUR NEGATIVE 10/31/2019 1640   BILIRUBINUR NEGATIVE 10/26/2019 1640   BILIRUBINUR Negative 07/14/2018 1352   KETONESUR NEGATIVE 10/28/2019 1640   PROTEINUR NEGATIVE 10/23/2019 1640   UROBILINOGEN negative 01/06/2016 1638   NITRITE NEGATIVE 10/12/2019 1640   LEUKOCYTESUR NEGATIVE  10/20/2019 1640   Sepsis Labs: @LABRCNTIP (procalcitonin:4,lacticidven:4)  ) Recent Results (from the past 240 hour(s))  Blood Culture (routine x 2)     Status: None (Preliminary result)   Collection Time: 10/23/2019  4:18 PM   Specimen: Right Antecubital; Blood  Result Value Ref Range Status   Specimen Description RIGHT ANTECUBITAL  Final   Special Requests   Final    BOTTLES DRAWN AEROBIC AND ANAEROBIC Blood Culture adequate volume   Culture   Final    NO GROWTH < 24 HOURS Performed at [facility], [address], [place], [place] [address]    Report Status PENDING  Incomplete  Urine culture     Status: None   Collection Time: 11/02/2019  4:40 PM   Specimen: In/Out Cath Urine  Result Value Ref Range Status   Specimen Description   Final    IN/OUT CATH URINE Performed at [facility], [address] [place], [place] [address]    Special Requests   Final    NONE Performed at [facility], [address], [place], [place] [address]    Culture   Final    NO GROWTH Performed at [facility], [place] [address] [place], [place] [address]    Report Status 11/08/2019 FINAL  Final  Blood Culture (routine x 2)     Status: None (Preliminary result)   Collection Time: 11/01/2019  4:45 PM   Specimen: Left Antecubital; Blood  Result Value Ref Range Status   Specimen Description LEFT ANTECUBITAL  Final   Special Requests   Final    BOTTLES DRAWN AEROBIC AND ANAEROBIC Blood Culture adequate volume   Culture   Final    NO GROWTH < 24 HOURS Performed at [facility], [address], [place],  [address]    Report Status PENDING  Incomplete  MRSA PCR Screening     Status: None   Collection Time: 11/08/19  2:59 AM   Specimen: Nasal Mucosa; Nasopharyngeal  Result Value Ref Range Status   MRSA by  PCR NEGATIVE NEGATIVE Final    Comment:        The GeneXpert MRSA Assay (FDA approved for NASAL specimens only), is one component of a comprehensive MRSA colonization surveillance  program. It is not intended to diagnose MRSA infection nor to guide or monitor treatment for MRSA infections. Performed at [facility], [place] [address], [place], [place] [address]          Radiology Studies: CT ANGIO CHEST PE W OR WO CONTRAST  Result Date: 11/08/2019 CLINICAL DATA:  Shortness of breath EXAM: CT ANGIOGRAPHY CHEST WITH CONTRAST TECHNIQUE: Multidetector CT imaging of the chest was performed using the standard protocol during bolus administration of intravenous contrast. Multiplanar CT image reconstructions and MIPs were obtained to evaluate the vascular anatomy. CONTRAST:  14m OMNIPAQUE IOHEXOL 350 MG/ML SOLN COMPARISON:  None. FINDINGS: Cardiovascular: There are pulmonary emboli seen within the pulmonary arteries bilaterally, most pronounced in the left lung but also noted in the right lower lobe. There is cardiomegaly. No evidence of right heart strain. Aortic and coronary artery atherosclerosis. No aneurysm. Mediastinum/Nodes: Enlarged pretracheal lymph node with a short axis diameter of 19 mm. Lungs/Pleura: Small bilateral pleural effusions. Dense consolidation in the lower lobes posteriorly could reflect atelectasis or consolidation/pneumonia. Upper Abdomen: Reflux of contrast into the IVC and hepatic veins suggests right heart dysfunction. NG tube is in place in the stomach. Musculoskeletal: Chest wall soft tissues are unremarkable. No acute bony abnormality. Review of the MIP images confirms the above findings. IMPRESSION: Bilateral pulmonary emboli, most pronounced in the left lower lobe. No evidence of right heart strain. There is reflux of contrast into the hepatic veins and IVC suggesting right heart dysfunction. Small bilateral effusions. Consolidation in both lower lobes could reflect atelectasis or infiltrate/pneumonia. Coronary artery disease. Mildly prominent mediastinal lymph nodes. These could be followed with repeat CT in 6 months. Aortic Atherosclerosis  (ICD10-I70.0). These results will be called to the ordering clinician or representative by the Radiologist Assistant, and communication documented in the PACS or zVision Dashboard. Electronically Signed   By: KRolm BaptiseM.D.   On: 11/08/2019 10:16   DG Chest Port 1 View  Result Date: 11/08/2019 CLINICAL DATA:  Respiratory failure EXAM: PORTABLE CHEST 1 VIEW COMPARISON:  08/24/2017 FINDINGS: Increased interstitial markings. Mild left basilar/left lower lobe opacities. No pleural effusion or pneumothorax. The heart is normal in size.  Thoracic aortic atherosclerosis. Bilateral rib fracture deformities, chronic. IMPRESSION: Left lower lobe opacity with increased interstitial markings, raising concern for possible pneumonia. Electronically Signed   By: SJulian HyM.D.   On: 10/18/2019 16:59   ECHOCARDIOGRAM COMPLETE  Result Date: 11/08/2019   ECHOCARDIOGRAM REPORT   Patient Name:   JRASHAUNA TEPDate of Exam: 11/08/2019 Medical Rec #:  [id number]   Height:       62.0 in Accession #:    [id number]  Weight:       97.4 lb Date of Birth:  614-Sep-1921   BSA:          1.41 m Patient Age:    89years     BP:           106/53 mmHg Patient Gender: F            HR:           92 bpm. Exam Location:  Inpatient Procedure: 2D Echo, Cardiac Doppler and Color Doppler Indications:    I48.91* Unspeicified atrial fibrillation  History:  Patient has no prior history of Echocardiogram examinations.                 CAD, Abnormal ECG, Arrythmias:Atrial Fibrillation,                 Signs/Symptoms:Altered Mental Status and Alzheimer's; Risk                 Factors:Hypertension. Covid 19 positive.  Sonographer:    Roseanna Rainbow RDCS Referring Phys: [id number] [place]  1. Left ventricular ejection fraction, by visual estimation, is 50 to 55%. The left ventricle has low normal function. There is mildly increased left ventricular hypertrophy.  2. Mild hypokinesis of the left ventricular, basal-mid inferior  wall.  3. Left ventricular diastolic function could not be evaluated.  4. The left ventricle demonstrates regional wall motion abnormalities.  5. Global right ventricle has normal systolic function.The right ventricular size is normal. No increase in right ventricular wall thickness.  6. Left atrial size was moderately dilated.  7. Right atrial size was mildly dilated.  8. Mild mitral annular calcification.  9. The mitral valve is abnormal. Moderate to severe mitral valve regurgitation. 10. The aortic valve is tricuspid. Aortic valve regurgitation is mild to moderate. Mild aortic valve stenosis. There is 0.5 x 0.5 cm mobile mass which may represent thrombus or vegetation on the valve. 11. Aortic valve peak gradient measures 7.3 mmHg. 12. Aortic valve mean gradient measures 4.0 mmHg. 13. Aortic valve area, by VTI measures 1.46 cm. 14. Aortic valve regurgitation is mild to moderate. 15. The pulmonic valve was not well visualized. Pulmonic valve regurgitation is mild. 16. Pulmonic regurgitation is mild. 17. Mildly elevated pulmonary artery systolic pressure. 18. The inferior vena cava is dilated in size with >50% respiratory variability, suggesting right atrial pressure of 8 mmHg. 19. The tricuspid regurgitant velocity is 2.46 m/s, and with an assumed right atrial pressure of 8 mmHg, the estimated right ventricular systolic pressure is mildly elevated at 32.3 mmHg. 20. The tricuspid valve is abnormal. 21. The tricuspid valve is notable for a 0.9 x 0.7 cm mobile, non-calcified mass of the anterior leaflet which could be thrombus or vegetation and associated moderate TR. FINDINGS  Left Ventricle: Left ventricular ejection fraction, by visual estimation, is 50 to 55%. The left ventricle has low normal function. Mild hypokinesis of the left ventricular, basal-mid inferior wall. The left ventricle demonstrates regional wall motion abnormalities. There is mildly increased left ventricular hypertrophy. The left ventricular  diastology could not be evaluated due to atrial fibrillation. Left ventricular diastolic function could not be evaluated. Right Ventricle: The right ventricular size is normal. No increase in right ventricular wall thickness. Global RV systolic function is has normal systolic function. The tricuspid regurgitant velocity is 2.46 m/s, and with an assumed right atrial pressure  of 8 mmHg, the estimated right ventricular systolic pressure is mildly elevated at 32.3 mmHg. Left Atrium: Left atrial size was moderately dilated. Right Atrium: Right atrial size was mildly dilated Pericardium: There is no evidence of pericardial effusion. Mitral Valve: The mitral valve is abnormal. There is mild thickening of the mitral valve leaflet(s). Mild mitral annular calcification. Moderate to severe mitral valve regurgitation. Tricuspid Valve: The tricuspid valve is abnormal. Tricuspid valve regurgitation moderate. There is a small mobile tricuspid valve vegetation or possibly thrombus. The TV vegetation measures 7 mm x 9 mm. Aortic Valve: The aortic valve is tricuspid. Aortic valve regurgitation is mild to moderate. Aortic regurgitation PHT measures 198 msec.  Mild aortic stenosis is present. Aortic valve mean gradient measures 4.0 mmHg. Aortic valve peak gradient measures 7.3 mmHg. Aortic valve area, by VTI measures 1.46 cm. A mobile vegetation or thrombus is seen on an indeterminate cusp. The AoV vegetation measures 5 mm x 5 mm. Pulmonic Valve: The pulmonic valve was not well visualized. Pulmonic valve regurgitation is mild. Pulmonic regurgitation is mild. Aorta: The aortic root and ascending aorta are structurally normal, with no evidence of dilitation. Venous: The inferior vena cava is dilated in size with greater than 50% respiratory variability, suggesting right atrial pressure of 8 mmHg. IAS/Shunts: No atrial level shunt detected by color flow Doppler.  LEFT VENTRICLE PLAX 2D LVIDd:         3.75 cm LVIDs:         2.74 cm LV PW:          1.34 cm LV IVS:        1.01 cm LVOT diam:     1.80 cm LV SV:         32 ml LV SV Index:   23.14 LVOT Area:     2.54 cm  LV Volumes (MOD) LV area d, A2C:    8.07 cm LV area d, A4C:    15.90 cm LV area s, A2C:    7.60 cm LV area s, A4C:    10.10 cm LV major d, A2C:   2.90 cm LV major d, A4C:   4.92 cm LV major s, A2C:   4.85 cm LV major s, A4C:   4.58 cm LV vol d, MOD A2C: 19.3 ml LV vol d, MOD A4C: 39.4 ml LV vol s, MOD A2C: 9.9 ml LV vol s, MOD A4C: 18.3 ml LV SV MOD A2C:     9.4 ml LV SV MOD A4C:     39.4 ml LV SV MOD BP:      6.0 ml RIGHT VENTRICLE            IVC RV S prime:     8.49 cm/s  IVC diam: 2.22 cm TAPSE (M-mode): 1.6 cm LEFT ATRIUM             Index       RIGHT ATRIUM           Index LA diam:        5.00 cm 3.55 cm/m  RA Area:     12.80 cm LA Vol (A2C):   59.9 ml 42.53 ml/m RA Volume:   29.10 ml  20.66 ml/m LA Vol (A4C):   64.3 ml 45.66 ml/m LA Biplane Vol: 64.8 ml 46.01 ml/m  AORTIC VALVE                   PULMONIC VALVE AV Area (Vmax):    1.48 cm    PR End Diast Vel: 1.88 msec AV Area (Vmean):   1.49 cm AV Area (VTI):     1.46 cm AV Vmax:           135.00 cm/s AV Vmean:          95.900 cm/s AV VTI:            0.275 m AV Peak Grad:      7.3 mmHg AV Mean Grad:      4.0 mmHg LVOT Vmax:         78.30 cm/s LVOT Vmean:        56.000 cm/s LVOT VTI:  0.158 m LVOT/AV VTI ratio: 0.57 AI PHT:            198 msec  AORTA Ao Root diam: 2.70 cm MITRAL VALVE                        TRICUSPID VALVE MV Area (PHT): 3.72 cm             TR Peak grad:   24.3 mmHg MV PHT:        59.16 msec           TR Vmax:        274.00 cm/s MV Decel Time: 204 msec MV E velocity: 110.00 cm/s 103 cm/s SHUNTS                                     Systemic VTI:  0.16 m                                     Systemic Diam: 1.80 cm  Lyman Bishop MD Electronically signed by Lyman Bishop MD Signature Date/Time: 11/08/2019/4:05:29 PM    Final    VAS [place] LOWER EXTREMITY VENOUS (DVT)  Result Date: 11/08/2019  Lower  Venous Study Indications: Covid positive with elevated d-dimer.  Comparison Study: No priors. Performing Technologist: Oda Cogan RDMS, RVT  Examination Guidelines: A complete evaluation includes B-mode imaging, spectral Doppler, color Doppler, and power Doppler as needed of all accessible portions of each vessel. Bilateral testing is considered an integral part of a complete examination. Limited examinations for reoccurring indications may be performed as noted.  +---------+---------------+---------+-----------+----------+--------------+ RIGHT    CompressibilityPhasicitySpontaneityPropertiesThrombus Aging +---------+---------------+---------+-----------+----------+--------------+ CFV      None           No       No                                  +---------+---------------+---------+-----------+----------+--------------+ SFJ      None                                                        +---------+---------------+---------+-----------+----------+--------------+ FV Prox  None                                                        +---------+---------------+---------+-----------+----------+--------------+ FV Mid   None                                                        +---------+---------------+---------+-----------+----------+--------------+ FV DistalNone                                                        +---------+---------------+---------+-----------+----------+--------------+  PFV      None                                                        +---------+---------------+---------+-----------+----------+--------------+ POP      None           No       No                                  +---------+---------------+---------+-----------+----------+--------------+ PTV      None                                                        +---------+---------------+---------+-----------+----------+--------------+ PERO     None                                                         +---------+---------------+---------+-----------+----------+--------------+ REIV     None           No       No                                  +---------+---------------+---------+-----------+----------+--------------+ RCIV     Full           Yes      Yes                                 +---------+---------------+---------+-----------+----------+--------------+   +---------+---------------+---------+-----------+----------+--------------+ LEFT     CompressibilityPhasicitySpontaneityPropertiesThrombus Aging +---------+---------------+---------+-----------+----------+--------------+ CFV      Full           Yes      Yes                                 +---------+---------------+---------+-----------+----------+--------------+ SFJ      Full                                                        +---------+---------------+---------+-----------+----------+--------------+ FV Prox  Full                                                        +---------+---------------+---------+-----------+----------+--------------+ FV Mid   Full                                                        +---------+---------------+---------+-----------+----------+--------------+  FV DistalFull                                                        +---------+---------------+---------+-----------+----------+--------------+ PFV      Full                                                        +---------+---------------+---------+-----------+----------+--------------+ POP      Full           Yes      Yes                                 +---------+---------------+---------+-----------+----------+--------------+ PTV      Full                                                        +---------+---------------+---------+-----------+----------+--------------+ PERO     Full                                                         +---------+---------------+---------+-----------+----------+--------------+     Summary: Right: Findings consistent with acute deep vein thrombosis involving the right common femoral vein, SF junction, right femoral vein, right proximal profunda vein, right popliteal vein, right posterior tibial veins, and right peroneal veins. Right EIV thrombosed, and CIV patent. Left: There is no evidence of deep vein thrombosis in the lower extremity. The common femoral vein obstruction does not appear to extend above inguinal ligament.  *See table(s) above for measurements and observations. Electronically signed by Deitra Mayo MD on 11/08/2019 at 3:08:39 PM.    Final         Scheduled Meds: . vitamin C  500 mg Oral Daily  . aspirin  81 mg Oral Daily  . atenolol  25 mg Oral Daily  . Chlorhexidine Gluconate Cloth  6 each Topical Daily  . dexamethasone (DECADRON) injection  6 mg Intravenous Q24H  . docusate sodium  100 mg Oral BID  . donepezil  10 mg Oral QHS  . insulin aspart  0-5 Units Subcutaneous QHS  . insulin aspart  0-9 Units Subcutaneous TID WC  . isosorbide mononitrate  30 mg Oral Daily  . mouth rinse  15 mL Mouth Rinse BID  . memantine  14 mg Oral Daily  . mirabegron ER  25 mg Oral QHS  . tiZANidine  2 mg Oral Once  . zinc sulfate  220 mg Oral Daily   Continuous Infusions: . sodium chloride 75 mL/hr at 11/08/19 1735  . ceFEPime (MAXIPIME) IV 2 g (11/08/19 1754)  . diltiazem (CARDIZEM) infusion Stopped (11/08/19 1506)  . feeding supplement (OSMOLITE 1.2 CAL)    . feeding supplement (VITAL HIGH PROTEIN) 1,000 mL (11/08/19 2057)  . heparin 750 Units/hr (11/08/19 1854)  . remdesivir  100 mg in NS 100 mL    . vancomycin       LOS: 2 days   The patient is critically ill with multiple organ systems failure and requires high complexity decision making for assessment and support, frequent evaluation and titration of therapies, application of advanced monitoring technologies and  extensive interpretation of multiple databases. Critical Care Time devoted to patient care services described in this note  Time spent: 40 minutes     Domonic Kimball, Geraldo Docker, MD [facility] Pager [phone]  If 7PM-7AM, please contact night-coverage [email or web address] Password [facility] 11/09/2019, 8:39 AM

## 2019-11-09 NOTE — Progress Notes (Signed)
ANTICOAGULATION CONSULT NOTE - Follow Up Consult  Pharmacy Consult for Heparin Indication: bilateral PE and RLE DVT  Allergies  Allergen Reactions  . Erythromycin Base Other (See Comments)    mycins"- "burned all the way through"  . Keflex [Cephalexin] Itching  . Other Itching    Mycins made patient itch  . Penicillins Cross Reactors     Patient is allergic to ALL MYCINS DRUGS!!!  . Sulfamethoxazole Rash    unknown    Patient Measurements: Height: 5\' 2"  (157.5 cm) Weight: 97 lb 7.1 oz (44.2 kg) IBW/kg (Calculated) : 50.1 Heparin Dosing Weight: TBW  Vital Signs: Temp: 97.6 F (36.4 C) (01/01 1933) Temp Source: Axillary (01/01 1933) BP: 110/59 (01/01 1933) Pulse Rate: 108 (01/01 1933)  Labs: Recent Labs    11/06/2019 1645 11/06/2019 1645 10/25/2019 2203 11/08/19 0605 11/08/19 1005 11/08/19 1140 11/09/19 0320 11/09/19 1203 11/09/19 2120  HGB 12.9  --   --  11.2*  --   --  9.9*  --   --   HCT 41.3  --   --  36.6  --   --  31.6*  --   --   PLT 131*  --   --  100*  --   --  105*  --   --   APTT 30  --   --   --   --   --   --   --   --   LABPROT 18.3*  --   --   --   --   --   --   --   --   INR 1.5*  --   --   --   --   --   --   --   --   HEPARINUNFRC  --    < >  --   --   --   --  0.34 0.26* 0.40  CREATININE 1.33*  --  1.13* 0.92  --   --  0.78  --   --   TROPONINIHS  --   --   --   --  16 17  --   --   --    < > = values in this interval not displayed.    Estimated Creatinine Clearance: 28 mL/min (by C-G formula based on SCr of 0.78 mg/dL).    Assessment: 51 yoF admitted on 12/30 with COVID-19 pneumonia.  She was initially started on Heparin [place] for VTE prophylaxis.  Pharmacy is now consulted to change to IV heparin for VTE. Most recent dose Heparin 5000 units [place] TID - on 12/31 at 06:00 CTa +bilateral PE, No evidence of right heart strain Dopplers: Right: acute DVT involving the right common femoral vein, SF junction, femoral vein, proximal profunda vein,  popliteal vein, right posterior tibial veins, and right peroneal veins   11/09/2019 2300 UPDATE Confirmatory heparin level: 0.40 IU/mL, within therapeutic goal range after rate  increase to 850 units/hr Per RN, no bleeding complications at this time   Bleeding: RN reports IV site bled when IV was removed on 12/31, dressing was applied and bleeding stopped.  Then about an hour later it had another large bleed. Initially controlled, then bleeding recurred. Thrombipad now applied, MD aware.     Goal of Therapy:  Heparin level 0.3-0.7 units/ml Monitor platelets by anticoagulation protocol: Yes   Plan:   Continue heparin infusion rate at 850 units/hr   Confirmatory heparin level with daily heparin level tomorrow am  Continue to monitor H&H and platelets  Despina Pole, Pharm. D. Clinical Pharmacist 11/09/2019 11:13 PM

## 2019-11-09 DEATH — deceased

## 2019-11-10 DIAGNOSIS — J189 Pneumonia, unspecified organism: Secondary | ICD-10-CM

## 2019-11-10 DIAGNOSIS — E878 Other disorders of electrolyte and fluid balance, not elsewhere classified: Secondary | ICD-10-CM | POA: Diagnosis present

## 2019-11-10 DIAGNOSIS — F0151 Vascular dementia with behavioral disturbance: Secondary | ICD-10-CM

## 2019-11-10 LAB — GLUCOSE, CAPILLARY
Glucose-Capillary: 155 mg/dL — ABNORMAL HIGH (ref 70–99)
Glucose-Capillary: 185 mg/dL — ABNORMAL HIGH (ref 70–99)
Glucose-Capillary: 217 mg/dL — ABNORMAL HIGH (ref 70–99)
Glucose-Capillary: 257 mg/dL — ABNORMAL HIGH (ref 70–99)
Glucose-Capillary: 259 mg/dL — ABNORMAL HIGH (ref 70–99)
Glucose-Capillary: 264 mg/dL — ABNORMAL HIGH (ref 70–99)

## 2019-11-10 LAB — CBC WITH DIFFERENTIAL/PLATELET
Abs Immature Granulocytes: 0.36 10*3/uL — ABNORMAL HIGH (ref 0.00–0.07)
Basophils Absolute: 0 10*3/uL (ref 0.0–0.1)
Basophils Relative: 0 %
Eosinophils Absolute: 0 10*3/uL (ref 0.0–0.5)
Eosinophils Relative: 0 %
HCT: 31.2 % — ABNORMAL LOW (ref 36.0–46.0)
Hemoglobin: 10.1 g/dL — ABNORMAL LOW (ref 12.0–15.0)
Immature Granulocytes: 2 %
Lymphocytes Relative: 4 %
Lymphs Abs: 0.7 10*3/uL (ref 0.7–4.0)
MCH: 28.9 pg (ref 26.0–34.0)
MCHC: 32.4 g/dL (ref 30.0–36.0)
MCV: 89.4 fL (ref 80.0–100.0)
Monocytes Absolute: 0.4 10*3/uL (ref 0.1–1.0)
Monocytes Relative: 3 %
Neutro Abs: 14.9 10*3/uL — ABNORMAL HIGH (ref 1.7–7.7)
Neutrophils Relative %: 91 %
Platelets: 127 10*3/uL — ABNORMAL LOW (ref 150–400)
RBC: 3.49 MIL/uL — ABNORMAL LOW (ref 3.87–5.11)
RDW: 14.7 % (ref 11.5–15.5)
WBC: 16.4 10*3/uL — ABNORMAL HIGH (ref 4.0–10.5)
nRBC: 2.1 % — ABNORMAL HIGH (ref 0.0–0.2)

## 2019-11-10 LAB — COMPREHENSIVE METABOLIC PANEL
ALT: 42 U/L (ref 0–44)
AST: 32 U/L (ref 15–41)
Albumin: 2.7 g/dL — ABNORMAL LOW (ref 3.5–5.0)
Alkaline Phosphatase: 57 U/L (ref 38–126)
Anion gap: 10 (ref 5–15)
BUN: 45 mg/dL — ABNORMAL HIGH (ref 8–23)
CO2: 17 mmol/L — ABNORMAL LOW (ref 22–32)
Calcium: 8 mg/dL — ABNORMAL LOW (ref 8.9–10.3)
Chloride: 116 mmol/L — ABNORMAL HIGH (ref 98–111)
Creatinine, Ser: 0.71 mg/dL (ref 0.44–1.00)
GFR calc Af Amer: >60 mL/min (ref >60)
GFR calc non Af Amer: >60 mL/min (ref >60)
Glucose, Bld: 273 mg/dL — ABNORMAL HIGH (ref 70–99)
Potassium: 4 mmol/L (ref 3.5–5.1)
Sodium: 143 mmol/L (ref 135–145)
Total Bilirubin: 1.1 mg/dL (ref 0.3–1.2)
Total Protein: 5.4 g/dL — ABNORMAL LOW (ref 6.5–8.1)

## 2019-11-10 LAB — PHOSPHORUS: Phosphorus: 1.4 mg/dL — ABNORMAL LOW (ref 2.5–4.6)

## 2019-11-10 LAB — PROCALCITONIN: Procalcitonin: 0.43 ng/mL

## 2019-11-10 LAB — D-DIMER, QUANTITATIVE: D-Dimer, Quant: 11.5 ug/mL-FEU — ABNORMAL HIGH (ref 0.00–0.50)

## 2019-11-10 LAB — HEPARIN LEVEL (UNFRACTIONATED): Heparin Unfractionated: 0.48 IU/mL (ref 0.30–0.70)

## 2019-11-10 LAB — MAGNESIUM: Magnesium: 2 mg/dL (ref 1.7–2.4)

## 2019-11-10 LAB — C-REACTIVE PROTEIN: CRP: 8.5 mg/dL — ABNORMAL HIGH (ref <1.0)

## 2019-11-10 LAB — FERRITIN: Ferritin: 931 ng/mL — ABNORMAL HIGH (ref 11–307)

## 2019-11-10 MED ORDER — SODIUM PHOSPHATES 45 MMOLE/15ML IV SOLN
30.0000 mmol | Freq: Once | INTRAVENOUS | Status: AC
  Administered 2019-11-10: 30 mmol via INTRAVENOUS
  Filled 2019-11-10: qty 10

## 2019-11-10 NOTE — Progress Notes (Signed)
ANTICOAGULATION CONSULT NOTE - Follow Up Consult  Pharmacy Consult for Heparin Indication: bilateral PE and RLE DVT  Allergies  Allergen Reactions  . Erythromycin Base Other (See Comments)    mycins"- "burned all the way through"  . Keflex [Cephalexin] Itching  . Other Itching    Mycins made patient itch  . Penicillins Cross Reactors     Patient is allergic to ALL MYCINS DRUGS!!!  . Sulfamethoxazole Rash    unknown    Patient Measurements: Height: 5\' 2"  (157.5 cm) Weight: 98 lb 8.7 oz (44.7 kg) IBW/kg (Calculated) : 50.1 Heparin Dosing Weight: TBW  Vital Signs: Temp: 98.4 F (36.9 C) (01/02 0400) Temp Source: Axillary (01/02 0400) BP: 124/77 (01/02 0400) Pulse Rate: 99 (01/02 0400)  Labs: Recent Labs    10/25/2019 1645 11/06/2019 1645 10/16/2019 2203 11/08/19 0605 11/08/19 1005 11/08/19 1140 11/09/19 0320 11/09/19 1203 11/09/19 2120 11/10/19 0413  HGB 12.9  --   --  11.2*  --   --  9.9*  --   --  10.1*  HCT 41.3  --   --  36.6  --   --  31.6*  --   --  31.2*  PLT 131*  --   --  100*  --   --  105*  --   --  127*  APTT 30  --   --   --   --   --   --   --   --   --   LABPROT 18.3*  --   --   --   --   --   --   --   --   --   INR 1.5*  --   --   --   --   --   --   --   --   --   HEPARINUNFRC  --    < >  --   --   --   --  0.34 0.26* 0.40 0.48  CREATININE 1.33*  --  1.13* 0.92  --   --  0.78  --   --   --   TROPONINIHS  --   --   --   --  16 17  --   --   --   --    < > = values in this interval not displayed.    Estimated Creatinine Clearance: 28.4 mL/min (by C-G formula based on SCr of 0.78 mg/dL).  Assessment: 39 yoF admitted on 12/30 with COVID-19 pneumonia.  She was initially started on Heparin [place] for VTE prophylaxis.  Pharmacy is now consulted to change to IV heparin for VTE. Most recent dose Heparin 5000 units [place] TID - on 12/31 at 06:00 CTa +bilateral PE, No evidence of right heart strain Dopplers: Right: acute DVT involving the right common femoral vein,  SF junction, femoral vein, proximal profunda vein, popliteal vein, right posterior tibial veins, and right peroneal veins  12/31 Bleeding: RN reports IV site bled when IV was removed on 12/31, dressing was applied and bleeding stopped.  Then about an hour later it had another large bleed. Initially controlled, then bleeding recurred. Thrombipad now applied, MD aware.      11/10/2019  0600 UPDATE 2nd Confirmatory HL: 0.48 IU/mL, within therapeutic goal range -Per RN, no bleeding complications noted  -Hb now 10.1mg /dL  with  platelets increased to 127K -D-Dimer has decreased to 11.47mcg/mL-FEU   Goal of Therapy:  Heparin level 0.3-0.7 units/ml Monitor platelets by anticoagulation  protocol: Yes   Plan:   Continue heparin infusion rate at 850 units/hr   Daily heparin level  and CBC    Continue to monitor H&H and platelets  Despina Pole, Pharm. D. Clinical Pharmacist 11/10/2019 6:12 AM

## 2019-11-10 NOTE — Progress Notes (Signed)
1530Spoke to daughter Vaughan Basta in detail about current orders VS of pt.  Daughter Albin Fischer pt in order to speak to her.  Pt seem to be alert enough to understand the conversation back and forth as she nodded her head yes or no.  I explained that I had requested that the Dr give her a call for any Prognosis conversation.  Pt daughter Vaughan Basta agreed

## 2019-11-10 NOTE — Progress Notes (Addendum)
PROGRESS NOTE    Sue Schaefer  WUJ:[id number] DOB: 1919-12-05 DOA: 10/12/2019 PCP: Dettinger, Fransisca Kaufmann, MD   Brief Narrative:  Sue Schaefer  is a 86 y.o. female, PMHx Dementia, HTN, vitamin D deficiency HLD  Presents to the ED today with difficulty breathing.  Patient is nonverbal at the time of my exam so history is collected from the medical record.  Patient was apparently tested positive for Covid 19 3 weeks ago.  From what I gather, she was asymptomatic at that time.  She was already on Breo and dexamethasone for lung disease.  Apparently at baseline patient is talkative and interactive, but today she started to have altered mental status, shortness of breath, and hypoxia.  When patient arrived to the ER she was ill-appearing with hypoxia and tachycardia.  Sepsis was suspected and broad-spectrum antibiotics were started with aztreonam and vancomycin.  Patient's family was contacted by Dr. Sabra Heck for CODE STATUS discussion and patient was made DNR.  Patient has been nonverbal during her entire visit in the ER.  ER provider reported that she would attempt to help with position changes, but was otherwise unresponsive.  At the time of my exam patient is still nonverbal, will not open her eyes to pain or voice, but does withdraw from pain.  ED course Temperature 100, pulse 139, respiratory rate 23, blood pressure 111/55 White blood cell count 21.6 CHEM panel: Sodium 148, creatinine 1.33-baseline 1.16 LDH 177, triglycerides 131, ferritin 539, CRP 20.1, lactic acid 4.2, pro-Cal 0.21, fibrinogen 460 Blood cultures drawn Urine culture collected Chest x-ray shows left lower lobe opacity EKG shows a heart rate of 145 and A. fib with RVR QTC is 441   Subjective: 1/2 afebrile last 24 hours, opens eyes nods yes and no appropriately to questions.  States feels short of breath although SPO2= 98%.  Does not follow commands   Assessment & Plan:   Active Problems:   Dementia with behavioral  disturbance (HCC)   Hyperlipidemia LDL goal <130   Coronary artery disease   Essential hypertension, benign   CAD (coronary artery disease) of artery bypass graft   COVID-19   Pneumonia due to COVID-19 virus   Refeeding syndrome   Covid pneumonia/acute respiratory failure with hypoxia COVID-19 Labs  Recent Labs    10/12/2019 1645 11/08/19 0605 11/09/19 0320 11/10/19 0413  DDIMER >20.00* >20.00* >20.00* 11.50*  FERRITIN 539* 593* 963* 931*  LDH 177  --   --   --   CRP 20.1* 21.8* 15.3* 8.5*   12/30 POC SARS coronavirus positive  -Decadron 6 mg daily -Remdesivir per pharmacy protocol -12/31 Actemra x1 dose -Xopenex (SVT) -Vitamins per Covid protocol -Titrate O2 to maintain SPO2> 88% -Prone patient 16 hours/day; if cannot tolerate prone 2 to 3 hours per shift -1/3 PCXR pending  Bilateral PE Acute -Continue full dose heparin -CTA chest PE protocol reveals PE see results below  Acute DVT RIGHT leg -Multiple vessels thrombosed right leg see results below -Heparin drip  Sepsis CAP/lactic acidosis -Upon admission patient met criteria for sepsis HR> 90, RR> 20, site of infection lungs -Trend lactic acid and procalcitonin Results for CATLIN, DORIA (MRN [id number]) as of 11/08/2019 15:50  Ref. Range 11/04/2019 16:45 11/08/2019 18:43 11/08/2019 00:29 11/08/2019 06:05 11/08/2019 11:40  Lactic Acid, Venous Latest Ref Range: 0.5 - 1.9 mmol/L 4.2 (HH) 4.0 (HH) 3.1 (HH) 3.0 (HH) 3.3 [facility])  Results for AILA, TERRA (MRN [id number]) as of 11/10/2019 15:45  Ref. Range 10/22/2019 16:45 11/08/2019 06:05 11/08/2019 11:40 11/09/2019  03:20 11/10/2019 04:13  Procalcitonin Latest Units: ng/mL 0.21 0.76 0.66 0.62 0.43   Endocarditis -Phone consult with Dr. Domenic Polite cardiology concurs to continue current antibiotic treatment until blood cultures finalized. -Recommend NOT TO obtain TEE given patient's tenuous status and the fact that she is not a surgical candidate. -1/2 phone consult with Dr. Marcelle Smiling, ID confirms we must treat for 6 weeks of antibiotics and then perform repeat echocardiogram  SVT -Most likely secondary to patient's PE, sepsis -Resolved  Hypotension -Albumin 50 g -1/1 D5W 31m/hr -Resolved, have decreased rate of D5W see hypernatremia  Acute metabolic encephalopathy vs Dementia -Patient currently somnolent does not follow commands.  Unsure of baseline. -Multifactorial to include Covid infection, PE, DVT, endocarditis, electrolyte abnormality. -1/2 appears to be resolving  Protein calorie malnutrition -NG tube placed -Feeding per ICU protocol  Hypokalemia -Potassium goal> 4  Hypernatremia -1/1 D5W 340mhr  Hypophosphatemia/ Refeeding syndrome -Secondary to refeeding syndrome -Sodium phosphate 30 mmol    DVT prophylaxis: Heparin drip Code Status: DNR Family Communication: 11/10/2019 spoke with LiVaughan Bastadaughter) discussed plan of care answered all questions Disposition Plan: TBD   Consultants:  Phone consult with Dr. McDomenic Politeardiology Phone consult RoTalbot GrumblingD    Procedures/Significant Events:  12/31 CTA chest PE protocol;-bilateral pulmonary emboli, most pronounced in the left lower lobe. -No evidence of right heart strain.  -There is reflux of contrast into the hepatic veins and IVC suggesting right heart dysfunction. -Small bilateral effusions. Consolidation in both lower lobes could reflect atelectasis or infiltrate/pneumonia. -CAD  -Mildly prominent mediastinal lymph nodes. Could be followed with repeat CT in 6 months. 12/31 bilateral lower extremity Doppler; Right:acute deep vein thrombosis involving the right common femoral vein, SF junction, right femoral vein, right proximal profunda vein, right popliteal vein, right posterior tibial veins, and right peroneal veins. Right EIV thrombosed, and CIV patent. Left: Negative for DVT 12/31 Echocardiogram;Left Ventricle: EF= 50 to 55%.-Mild hypokinesis of the left ventricular, basal-mid  inferior wall. The left ventricle -Left ventricular diastology could not be evaluated due to atrial fibrillation. Left ventricular diastolic function could not be evaluated. Left Atrium:  moderately dilated. Mitral Valve: Moderate to severe mitral valve regurgitation. Tricuspid Valve: regurgitation moderate. There is a small mobile tricuspid valve vegetation or possibly thrombus. . Aortic Valve:mild to moderate. A mobile vegetation or thrombus is seen on an indeterminate cusp. The AoV vegetation measures 5 mm x 5 mm.   VENTILATOR SETTINGS: Room air 1/2 SPO2; 98%   Cultures 12/30 POC SARS coronavirus positive 12/30 RIGHT AC NGTD 12/30 LEFT AC NGTD 12/30 urine negative 12/31 MRSA by PCR negative    Antimicrobials: Anti-infectives (From admission, onward)   Start     Dose/Rate Stop   11/09/19 1700  vancomycin (VANCOREADY) IVPB 500 mg/100 mL     500 mg 100 mL/hr over 60 Minutes     11/09/19 1000  remdesivir 100 mg in sodium chloride 0.9 % 100 mL IVPB     100 mg 200 mL/hr over 30 Minutes 11/13/19 0959   11/08/19 1800  ceFEPIme (MAXIPIME) 2 g in sodium chloride 0.9 % 100 mL IVPB     2 g 200 mL/hr over 30 Minutes     11/08/19 0400  remdesivir 200 mg in sodium chloride 0.9% 250 mL IVPB     200 mg 580 mL/hr over 30 Minutes 11/08/19 0401   11/08/19 0100  aztreonam (AZACTAM) injection 500 mg  Status:  Discontinued     500 mg 10/17/2019 2121   11/08/19  0100  aztreonam (AZACTAM) 0.5 g in dextrose 5 % 50 mL IVPB  Status:  Discontinued     0.5 g 100 mL/hr over 30 Minutes 11/08/19 1527   10/24/2019 1615  vancomycin (VANCOCIN) IVPB 1000 mg/200 mL premix     1,000 mg 200 mL/hr over 60 Minutes 10/19/2019 1809   10/17/2019 1615  aztreonam (AZACTAM) 2 g in sodium chloride 0.9 % 100 mL IVPB     2 g 200 mL/hr over 30 Minutes 11/06/2019 1707       Devices    LINES / TUBES:      Continuous Infusions: . ceFEPime (MAXIPIME) IV Stopped (11/10/19 0427)  . dextrose 30 mL/hr at 11/10/19 1200   . diltiazem (CARDIZEM) infusion 2.5 mg/hr (11/10/19 1200)  . feeding supplement (OSMOLITE 1.2 CAL) 1,000 mL (11/10/19 0021)  . heparin 850 Units/hr (11/10/19 1200)  . remdesivir 100 mg in NS 100 mL 100 mg (11/10/19 1031)  . sodium phosphate  Dextrose 5% IVPB 30 mmol (11/10/19 1147)  . vancomycin Stopped (11/09/19 1939)     Objective: Vitals:   11/10/19 0400 11/10/19 0500 11/10/19 1000 11/10/19 1200  BP: 124/77  135/64 132/66  Pulse: 99  97 85  Resp: _0 Temp: 98.4 F (36.9 C)  98.9 F (37.2 C) 98.3 F (36.8 C)  TempSrc: Axillary  Axillary Axillary  SpO2: 96%  98% 96%  Weight:  44.7 kg    Height:        Intake/Output Summary (Last 24 hours) at 11/10/2019 1612 Last data filed at 11/10/2019 1500 Gross per 24 hour  Intake 1961.98 ml  Output 2100 ml  Net -138.02 ml   Filed Weights   11/01/2019 1620 11/08/19 0315 11/10/19 0500  Weight: 43.1 kg 44.2 kg 44.7 kg   Physical Exam:  General: Opens eyes when name called, appears to nod yes and no appropriately to questions, positive  acute respiratory distress, cachectic Eyes: negative scleral hemorrhage, negative anisocoria, negative icterus ENT: Negative Runny nose, negative gingival bleeding, Neck:  Negative scars, masses, torticollis, lymphadenopathy, JVD Lungs: Tachypneic, clear to auscultation bilaterally without wheezes or crackles Cardiovascular: Regular rate and rhythm without murmur gallop or rub normal S1 and S2 Abdomen: negative abdominal pain, nondistended, positive soft, bowel sounds, no rebound, no ascites, no appreciable mass Extremities: No significant cyanosis, clubbing, or edema bilateral lower extremities Skin: Negative rashes, lesions, ulcers Psychiatric: Unable to assess, Central nervous system: Nods yes and negative, does not follow commands          Data Reviewed: Care during the described time interval was provided by me .  I have reviewed this patient's available data, including medical history,  events of note, physical examination, and all test results as part of my evaluation.   CBC: Recent Labs  Lab 10/28/2019 1645 11/08/19 0605 11/09/19 0320 11/10/19 0413  WBC 21.6* 19.3* 15.3* 16.4*  NEUTROABS 19.7* 17.8* 14.1* 14.9*  HGB 12.9 11.2* 9.9* 10.1*  HCT 41.3 36.6 31.6* 31.2*  MCV 93.7 94.1 94.3 89.4  PLT 131* 100* 105* 353*   Basic Metabolic Panel: Recent Labs  Lab 11/01/2019 1645 10/12/2019 2203 11/08/19 0605 11/08/19 1655 11/09/19 0320 11/10/19 0413  NA 148* 147* 149*  --  148* 143  K 3.7 3.9 3.9  --  3.2* 4.0  CL 114* 118* 117*  --  122* 116*  CO2 19* 19* 18*  --  16* 17*  GLUCOSE 132* 157* 165*  --  219* 273*  BUN 48* 40* 35*  --  46* 45*  CREATININE 1.33* 1.13* 0.92  --  0.78 0.71  CALCIUM 9.0 7.8* 7.6*  --  7.8* 8.0*  MG  --   --  2.1 2.1 2.2 2.0  PHOS  --   --  2.3* 2.0* 1.7* 1.4*   GFR: Estimated Creatinine Clearance: 28.4 mL/min (by C-G formula based on SCr of 0.71 mg/dL). Liver Function Tests: Recent Labs  Lab 10/20/2019 1645 11/08/19 0605 11/09/19 0320 11/10/19 0413  AST 15 19 60* 32  ALT 16 15 45* 42  ALKPHOS 66 57 46 57  BILITOT 1.4* 1.1 1.2 1.1  PROT 7.2 5.9* 5.9* 5.4*  ALBUMIN 3.2* 2.5* 3.3* 2.7*   No results for input(s): LIPASE, AMYLASE in the last 168 hours. No results for input(s): AMMONIA in the last 168 hours. Coagulation Profile: Recent Labs  Lab 10/09/2019 1645  INR 1.5*   Cardiac Enzymes: No results for input(s): CKTOTAL, CKMB, CKMBINDEX, TROPONINI in the last 168 hours. BNP (last 3 results) No results for input(s): PROBNP in the last 8760 hours. HbA1C: Recent Labs    11/09/19 0320  HGBA1C 5.4   CBG: Recent Labs  Lab 11/09/19 2011 11/10/19 0003 11/10/19 0415 11/10/19 0724 11/10/19 1111  GLUCAP 141* 155* 259* 217* 185*   Lipid Profile: Recent Labs    10/22/2019 1646  TRIG 131   Thyroid Function Tests: No results for input(s): TSH, T4TOTAL, FREET4, T3FREE, THYROIDAB in the last 72 hours. Anemia Panel: Recent  Labs    11/09/19 0320 11/10/19 0413  FERRITIN 963* 931*   Urine analysis:    Component Value Date/Time   COLORURINE YELLOW 10/19/2019 1640   APPEARANCEUR HAZY (A) 11/02/2019 1640   APPEARANCEUR Clear 07/14/2018 1352   LABSPEC 1.017 10/20/2019 1640   PHURINE 5.0 10/22/2019 1640   GLUCOSEU NEGATIVE 10/13/2019 1640   HGBUR NEGATIVE 10/23/2019 1640   BILIRUBINUR NEGATIVE 10/16/2019 1640   BILIRUBINUR Negative 07/14/2018 1352   KETONESUR NEGATIVE 10/29/2019 1640   PROTEINUR NEGATIVE 10/24/2019 1640   UROBILINOGEN negative 01/06/2016 1638   NITRITE NEGATIVE 11/06/2019 1640   LEUKOCYTESUR NEGATIVE 10/25/2019 1640   Sepsis Labs: _0 (procalcitonin:4,lacticidven:4)  ) Recent Results (from the past 240 hour(s))  Blood Culture (routine x 2)     Status: None (Preliminary result)   Collection Time: 10/12/2019  4:18 PM   Specimen: Right Antecubital; Blood  Result Value Ref Range Status   Specimen Description RIGHT ANTECUBITAL  Final   Special Requests   Final    BOTTLES DRAWN AEROBIC AND ANAEROBIC Blood Culture adequate volume   Culture   Final    NO GROWTH 3 DAYS Performed at [facility], [address], [place], [place] [address]    Report Status PENDING  Incomplete  Urine culture     Status: None   Collection Time: 10/26/2019  4:40 PM   Specimen: In/Out Cath Urine  Result Value Ref Range Status   Specimen Description   Final    IN/OUT CATH URINE Performed at [facility], [address], [place], [place] [address]    Special Requests   Final    NONE Performed at [facility], [address], [place], [place] [address]    Culture   Final    NO GROWTH Performed at [facility], [place] [address] [place], [place] [address]    Report Status 11/08/2019 FINAL  Final  Blood Culture (routine x 2)     Status: None (Preliminary result)   Collection Time: 10/28/2019  4:45 PM   Specimen:  Left Antecubital; Blood  Result Value Ref Range Status   Specimen Description LEFT  ANTECUBITAL  Final   Special Requests   Final    BOTTLES DRAWN AEROBIC AND ANAEROBIC Blood Culture adequate volume   Culture   Final    NO GROWTH 3 DAYS Performed at [facility], [address] [place], [place] [address]    Report Status PENDING  Incomplete  MRSA PCR Screening     Status: None   Collection Time: 11/08/19  2:59 AM   Specimen: Nasal Mucosa; Nasopharyngeal  Result Value Ref Range Status   MRSA by PCR NEGATIVE NEGATIVE Final    Comment:        The GeneXpert MRSA Assay (FDA approved for NASAL specimens only), is one component of a comprehensive MRSA colonization surveillance program. It is not intended to diagnose MRSA infection nor to guide or monitor treatment for MRSA infections. Performed at [facility], [place] [address], [place], [place] [address]          Radiology Studies: DG Abd 1 View  Result Date: 11/09/2019 CLINICAL DATA:  Initial evaluation for NG tube placement. EXAM: ABDOMEN - 1 VIEW COMPARISON:  None. FINDINGS: Enteric tube in place with tip seen overlying the distal stomach, side hole beyond the GE junction. Bowel gas pattern is nonobstructive. Moderate stool burden noted at the rectal vault, suggesting constipation. Advanced degenerative spondylosis noted throughout the visualized spine. IMPRESSION: 1. Tip of enteric tube overlying the distal stomach, side hole well beyond the GE junction. 2. Moderate stool impacted within the rectal vault, suggesting constipation. Electronically Signed   By: Jeannine Boga M.D.   On: 11/09/2019 23:52   DG CHEST PORT 1 VIEW  Result Date: 11/09/2019 CLINICAL DATA:  Shortness of breath, pulmonary emboli EXAM: PORTABLE CHEST 1 VIEW COMPARISON:  11/05/2019 FINDINGS: Interval placement of enteric tube terminating in the expected location of the gastric body. Stable heart size. Calcified thoracic aorta. Increasing right-sided pleural effusion with associated right basilar opacity. No pneumothorax. Remote  bilateral rib fractures. Severe right shoulder arthropathy. IMPRESSION: 1. Increasing right-sided pleural effusion with associated right basilar opacity. 2. Interval placement of enteric tube terminating in the expected location of the gastric body. Electronically Signed   By: Davina Poke D.O.   On: 11/09/2019 11:53        Scheduled Meds: . vitamin C  500 mg Per Tube Daily  . aspirin  81 mg Per Tube Daily  . atenolol  25 mg Per Tube Daily  . Chlorhexidine Gluconate Cloth  6 each Topical Daily  . dexamethasone (DECADRON) injection  6 mg Intravenous Q24H  . docusate  100 mg Per Tube BID  . donepezil  10 mg Per Tube QHS  . insulin aspart  0-9 Units Subcutaneous Q4H  . mouth rinse  15 mL Mouth Rinse BID  . memantine  10 mg Per Tube Daily  . zinc sulfate  220 mg Per Tube Daily   Continuous Infusions: . ceFEPime (MAXIPIME) IV Stopped (11/10/19 0427)  . dextrose 30 mL/hr at 11/10/19 1200  . diltiazem (CARDIZEM) infusion 2.5 mg/hr (11/10/19 1200)  . feeding supplement (OSMOLITE 1.2 CAL) 1,000 mL (11/10/19 0021)  . heparin 850 Units/hr (11/10/19 1200)  . remdesivir 100 mg in NS 100 mL 100 mg (11/10/19 1031)  . sodium phosphate  Dextrose 5% IVPB 30 mmol (11/10/19 1147)  . vancomycin Stopped (11/09/19 1939)     LOS: 3 days   The patient is critically ill with multiple organ  systems failure and requires high complexity decision making for assessment and support, frequent evaluation and titration of therapies, application of advanced monitoring technologies and extensive interpretation of multiple databases. Critical Care Time devoted to patient care services described in this note  Time spent: 40 minutes     Quentin Shorey, Geraldo Docker, MD [facility] Pager [phone]  If 7PM-7AM, please contact night-coverage [email or web address] Password [facility] 11/10/2019, 4:12 PM

## 2019-11-11 ENCOUNTER — Inpatient Hospital Stay (HOSPITAL_COMMUNITY): Payer: Medicare HMO

## 2019-11-11 ENCOUNTER — Encounter (HOSPITAL_COMMUNITY): Payer: Self-pay | Admitting: Family Medicine

## 2019-11-11 DIAGNOSIS — E43 Unspecified severe protein-calorie malnutrition: Secondary | ICD-10-CM | POA: Diagnosis present

## 2019-11-11 DIAGNOSIS — J9601 Acute respiratory failure with hypoxia: Secondary | ICD-10-CM | POA: Diagnosis present

## 2019-11-11 DIAGNOSIS — G9341 Metabolic encephalopathy: Secondary | ICD-10-CM

## 2019-11-11 DIAGNOSIS — E870 Hyperosmolality and hypernatremia: Secondary | ICD-10-CM | POA: Diagnosis present

## 2019-11-11 DIAGNOSIS — I339 Acute and subacute endocarditis, unspecified: Secondary | ICD-10-CM

## 2019-11-11 DIAGNOSIS — A419 Sepsis, unspecified organism: Secondary | ICD-10-CM | POA: Diagnosis present

## 2019-11-11 DIAGNOSIS — N183 Chronic kidney disease, stage 3 unspecified: Secondary | ICD-10-CM | POA: Diagnosis present

## 2019-11-11 DIAGNOSIS — I2699 Other pulmonary embolism without acute cor pulmonale: Secondary | ICD-10-CM | POA: Diagnosis present

## 2019-11-11 HISTORY — DX: Chronic kidney disease, stage 3 unspecified: N18.30

## 2019-11-11 LAB — LACTIC ACID, PLASMA
Lactic Acid, Venous: 2.5 mmol/L (ref 0.5–1.9)
Lactic Acid, Venous: 1.2 mmol/L (ref 0.5–1.9)

## 2019-11-11 LAB — COMPREHENSIVE METABOLIC PANEL
ALT: 36 U/L (ref 0–44)
AST: 22 U/L (ref 15–41)
Albumin: 2.8 g/dL — ABNORMAL LOW (ref 3.5–5.0)
Alkaline Phosphatase: 53 U/L (ref 38–126)
Anion gap: 13 (ref 5–15)
BUN: 43 mg/dL — ABNORMAL HIGH (ref 8–23)
CO2: 17 mmol/L — ABNORMAL LOW (ref 22–32)
Calcium: 7.8 mg/dL — ABNORMAL LOW (ref 8.9–10.3)
Chloride: 110 mmol/L (ref 98–111)
Creatinine, Ser: 0.64 mg/dL (ref 0.44–1.00)
GFR calc Af Amer: >60 mL/min (ref >60)
GFR calc non Af Amer: >60 mL/min (ref >60)
Glucose, Bld: 231 mg/dL — ABNORMAL HIGH (ref 70–99)
Potassium: 3.6 mmol/L (ref 3.5–5.1)
Sodium: 140 mmol/L (ref 135–145)
Total Bilirubin: 1.2 mg/dL (ref 0.3–1.2)
Total Protein: 5.2 g/dL — ABNORMAL LOW (ref 6.5–8.1)

## 2019-11-11 LAB — FERRITIN: Ferritin: 735 ng/mL — ABNORMAL HIGH (ref 11–307)

## 2019-11-11 LAB — CBC WITH DIFFERENTIAL/PLATELET
Abs Immature Granulocytes: 0.7 10*3/uL — ABNORMAL HIGH (ref 0.00–0.07)
Basophils Absolute: 0.1 10*3/uL (ref 0.0–0.1)
Basophils Relative: 0 %
Eosinophils Absolute: 0 10*3/uL (ref 0.0–0.5)
Eosinophils Relative: 0 %
HCT: 31.2 % — ABNORMAL LOW (ref 36.0–46.0)
Hemoglobin: 10 g/dL — ABNORMAL LOW (ref 12.0–15.0)
Immature Granulocytes: 4 %
Lymphocytes Relative: 4 %
Lymphs Abs: 0.8 10*3/uL (ref 0.7–4.0)
MCH: 28.6 pg (ref 26.0–34.0)
MCHC: 32.1 g/dL (ref 30.0–36.0)
MCV: 89.1 fL (ref 80.0–100.0)
Monocytes Absolute: 1.1 10*3/uL — ABNORMAL HIGH (ref 0.1–1.0)
Monocytes Relative: 5 %
Neutro Abs: 17.5 10*3/uL — ABNORMAL HIGH (ref 1.7–7.7)
Neutrophils Relative %: 87 %
Platelets: 143 10*3/uL — ABNORMAL LOW (ref 150–400)
RBC: 3.5 MIL/uL — ABNORMAL LOW (ref 3.87–5.11)
RDW: 14.9 % (ref 11.5–15.5)
WBC: 20.2 10*3/uL — ABNORMAL HIGH (ref 4.0–10.5)
nRBC: 4.3 % — ABNORMAL HIGH (ref 0.0–0.2)

## 2019-11-11 LAB — D-DIMER, QUANTITATIVE: D-Dimer, Quant: 14.52 ug/mL-FEU — ABNORMAL HIGH (ref 0.00–0.50)

## 2019-11-11 LAB — GLUCOSE, CAPILLARY
Glucose-Capillary: 155 mg/dL — ABNORMAL HIGH (ref 70–99)
Glucose-Capillary: 161 mg/dL — ABNORMAL HIGH (ref 70–99)
Glucose-Capillary: 179 mg/dL — ABNORMAL HIGH (ref 70–99)
Glucose-Capillary: 180 mg/dL — ABNORMAL HIGH (ref 70–99)
Glucose-Capillary: 191 mg/dL — ABNORMAL HIGH (ref 70–99)

## 2019-11-11 LAB — PHOSPHORUS: Phosphorus: 2.3 mg/dL — ABNORMAL LOW (ref 2.5–4.6)

## 2019-11-11 LAB — HEPARIN LEVEL (UNFRACTIONATED): Heparin Unfractionated: 0.39 IU/mL (ref 0.30–0.70)

## 2019-11-11 LAB — C-REACTIVE PROTEIN: CRP: 5 mg/dL — ABNORMAL HIGH (ref <1.0)

## 2019-11-11 LAB — MAGNESIUM: Magnesium: 2 mg/dL (ref 1.7–2.4)

## 2019-11-11 MED ORDER — DEXTROSE-NACL 5-0.9 % IV SOLN: INTRAVENOUS | Status: DC

## 2019-11-11 NOTE — Progress Notes (Signed)
Sent secure text to MD to advise of lactic acid of 2.5 per lab.

## 2019-11-11 NOTE — Progress Notes (Signed)
PROGRESS NOTE  Sue Schaefer ZJQ:[id number] DOB: 06-20-1920 DOA: 11/06/2019 PCP: Dettinger, Fransisca Kaufmann, MD  HPI/Recap of past 75 hours: 86 year old female with past medical history of dementia, hypertension and CAD who presented to the emergency room with shortness of breath on 12/30.  Patient had reportedly tested positive for Covid 3 weeks prior but had been asymptomatic at that time.  She was found to be hypoxic, hypernatremic, septic and nonverbal, with her baseline being normally interactive and talkative.  Admitted to the hospitalist service and started on IV fluids and antibiotics.  Patient was discovered by CT scan to have a pulmonary embolus secondary to an acute right lower extremity DVT and started on anticoagulation on 12/31.  An echocardiogram done to assess her pulmonary embolus noted a small mobile tricuspid valve vegetation, possibly a thrombus as well as moderate to severe mitral regurg noted.  In discussion with cardiology, they recommended treatment with IV antibiotics for 6 weeks.  Since admission, she has remained minimally responsive although she has been able to be weaned off of oxygen.  An NG tube for tube feeds has been placed.  Today, patient about the same.  Follow-up labs noted lactic acid level still elevated 2.5, although improved from admission.  Patient sodium has since normalized.  Assessment/Plan: Active Problems:   Dementia with behavioral disturbance (HCC)/acute metabolic encephalopathy: Persistent.  Even with correction of hyponatremia.  Likely worsened due to sepsis.    Hyperlipidemia LDL goal <130    Essential hypertension, benign: Blood pressure stable.    CAD (coronary artery disease) of artery bypass graft: Stable.  Sepsis secondary to pneumonia due to COVID-19 virus causing acute respiratory failure with hypoxia: Patient met criteria for sepsis on admission given acute metabolic encephalopathy, lactic acid level of 4.2, fever and tachycardia with  pulmonary source and secondary hypoxia.  With some IV fluids and antibiotics, sepsis improved, but lactic acid still not yet normalized.  Continue antibiotics, Remdisivir and steroids.  Patient initially required high flow nasal cannula to keep oxygen saturations above 90%.  Over the next few days, this was able to be weaned off completely.  Severe protein calorie malnutrition/Refeeding syndrome: Nutrition to see.  Feeding tube placed.  Received 1 dose of IV albumin.   Acute kidney injury in the setting of CKD (chronic kidney disease), stage III: Renal function has since stabilized.  Acute kidney injury secondary to sepsis.  Bilateral pulmonary emboli secondary to acute right lower extremity DVT: On heparin.  Hypernatremia: Secondary to dehydration from sepsis.  Looks to have since resolved although her mentation has not changed at all.  Moderate to severe mitral regurg: Monitor closely as we are aggressively rehydrating her.  Suspected endocarditis: Case discussed with infectious disease and cardiology.  Not a surgical candidate given her advanced age, comorbidities and tenuous status.  Plan is for 6 weeks of IV antibiotics and then follow-up echocardiogram  Code Status: DNR  Family Communication: Updated daughter by phone   Disposition Plan: To be determined.  Patient may not make a good recovery from hospitalization.   Consultants:  None  Procedures:  None  Antimicrobials:  IV Azactam 12/30-12/31  IV Remdisivir 12/30-present  IV cefepime 12/31-present  IV vancomycin 12/30-present  DVT prophylaxis: Heparin drip   Objective: Vitals:   11/11/19 0745 11/11/19 1100  BP: (!) 107/55   Pulse: 92   Resp: 20 16  Temp: 97.9 F (36.6 C) 98 F (36.7 C)  SpO2: 98%     Intake/Output Summary (Last 24 hours) at  11/11/2019 1440 Last data filed at 11/11/2019 1100 Gross per 24 hour  Intake 623 ml  Output 2250 ml  Net -1627 ml   Filed Weights   11/08/19 0315 11/10/19 0500  11/11/19 0408  Weight: 44.2 kg 44.7 kg 48.7 kg   Body mass index is 19.64 kg/m.  Exam:   General: Emaciated, minimally responsive, does not appear to be in any acute distress  HEENT: Normocephalic, mucous membranes quite dry  Neck: Supple, no JVD  Cardiovascular: Regular rate and rhythm, S1-S2  Respiratory: Decreased breath sounds throughout, few Rales  Abdomen: Soft, nondistended, hypoactive bowel sounds  Musculoskeletal: No clubbing or cyanosis, or edema  Skin: Poor turgor, dry skin  Psychiatry: Does not respond to voice or commands   Data Reviewed: CBC: Recent Labs  Lab 10/21/2019 1645 11/08/19 0605 11/09/19 0320 11/10/19 0413 11/11/19 0144  WBC 21.6* 19.3* 15.3* 16.4* 20.2*  NEUTROABS 19.7* 17.8* 14.1* 14.9* 17.5*  HGB 12.9 11.2* 9.9* 10.1* 10.0*  HCT 41.3 36.6 31.6* 31.2* 31.2*  MCV 93.7 94.1 94.3 89.4 89.1  PLT 131* 100* 105* 127* 453*   Basic Metabolic Panel: Recent Labs  Lab 10/19/2019 2203 11/08/19 0605 11/08/19 1655 11/09/19 0320 11/10/19 0413 11/11/19 0144  NA 147* 149*  --  148* 143 140  K 3.9 3.9  --  3.2* 4.0 3.6  CL 118* 117*  --  122* 116* 110  CO2 19* 18*  --  16* 17* 17*  GLUCOSE 157* 165*  --  219* 273* 231*  BUN 40* 35*  --  46* 45* 43*  CREATININE 1.13* 0.92  --  0.78 0.71 0.64  CALCIUM 7.8* 7.6*  --  7.8* 8.0* 7.8*  MG  --  2.1 2.1 2.2 2.0 2.0  PHOS  --  2.3* 2.0* 1.7* 1.4* 2.3*   GFR: Estimated Creatinine Clearance: 30.9 mL/min (by C-G formula based on SCr of 0.64 mg/dL). Liver Function Tests: Recent Labs  Lab 10/27/2019 1645 11/08/19 0605 11/09/19 0320 11/10/19 0413 11/11/19 0144  AST 15 19 60* 32 22  ALT 16 15 45* 42 36  ALKPHOS 66 57 46 57 53  BILITOT 1.4* 1.1 1.2 1.1 1.2  PROT 7.2 5.9* 5.9* 5.4* 5.2*  ALBUMIN 3.2* 2.5* 3.3* 2.7* 2.8*   No results for input(s): LIPASE, AMYLASE in the last 168 hours. No results for input(s): AMMONIA in the last 168 hours. Coagulation Profile: Recent Labs  Lab 10/16/2019 1645  INR  1.5*   Cardiac Enzymes: No results for input(s): CKTOTAL, CKMB, CKMBINDEX, TROPONINI in the last 168 hours. BNP (last 3 results) No results for input(s): PROBNP in the last 8760 hours. HbA1C: Recent Labs    11/09/19 0320  HGBA1C 5.4   CBG: Recent Labs  Lab 11/10/19 2017 11/10/19 2353 11/11/19 0407 11/11/19 0758 11/11/19 1121  GLUCAP 264* 180* 155* 191* 179*   Lipid Profile: No results for input(s): CHOL, HDL, LDLCALC, TRIG, CHOLHDL, LDLDIRECT in the last 72 hours. Thyroid Function Tests: No results for input(s): TSH, T4TOTAL, FREET4, T3FREE, THYROIDAB in the last 72 hours. Anemia Panel: Recent Labs    11/10/19 0413 11/11/19 0500  FERRITIN 931* 735*   Urine analysis:    Component Value Date/Time   COLORURINE YELLOW 10/23/2019 1640   APPEARANCEUR HAZY (A) 11/06/2019 1640   APPEARANCEUR Clear 07/14/2018 1352   LABSPEC 1.017 11/08/2019 1640   PHURINE 5.0 11/02/2019 1640   GLUCOSEU NEGATIVE 10/22/2019 1640   HGBUR NEGATIVE 10/28/2019 1640   BILIRUBINUR NEGATIVE 10/25/2019 1640   BILIRUBINUR Negative  07/14/2018 1352   [place] 10/12/2019 1640   PROTEINUR NEGATIVE 10/17/2019 1640   UROBILINOGEN negative 01/06/2016 1638   NITRITE NEGATIVE 10/18/2019 1640   LEUKOCYTESUR NEGATIVE 11/04/2019 1640   Sepsis Labs: _0 (procalcitonin:4,lacticidven:4)  ) Recent Results (from the past 240 hour(s))  Blood Culture (routine x 2)     Status: None (Preliminary result)   Collection Time: 10/12/2019  4:18 PM   Specimen: Right Antecubital; Blood  Result Value Ref Range Status   Specimen Description RIGHT ANTECUBITAL  Final   Special Requests   Final    BOTTLES DRAWN AEROBIC AND ANAEROBIC Blood Culture adequate volume   Culture   Final    NO GROWTH 3 DAYS Performed at [facility], [address] [place], [place] [address]    Report Status PENDING  Incomplete  Urine culture     Status: None   Collection Time: 11/02/2019  4:40 PM   Specimen: In/Out Cath Urine    Result Value Ref Range Status   Specimen Description   Final    IN/OUT CATH URINE Performed at [facility], [address], [place], [place] [address]    Special Requests   Final    NONE Performed at [facility] [address], [place], [place] [address]    Culture   Final    NO GROWTH Performed at [facility], [place] [address] [place], [place] [address]    Report Status 11/08/2019 FINAL  Final  Blood Culture (routine x 2)     Status: None (Preliminary result)   Collection Time: 11/04/2019  4:45 PM   Specimen: Left Antecubital; Blood  Result Value Ref Range Status   Specimen Description LEFT ANTECUBITAL  Final   Special Requests   Final    BOTTLES DRAWN AEROBIC AND ANAEROBIC Blood Culture adequate volume   Culture   Final    NO GROWTH 3 DAYS Performed at [facility], [address], [place], [place] [address]    Report Status PENDING  Incomplete  MRSA PCR Screening     Status: None   Collection Time: 11/08/19  2:59 AM   Specimen: Nasal Mucosa; Nasopharyngeal  Result Value Ref Range Status   MRSA by PCR NEGATIVE NEGATIVE Final    Comment:        The GeneXpert MRSA Assay (FDA approved for NASAL specimens only), is one component of a comprehensive MRSA colonization surveillance program. It is not intended to diagnose MRSA infection nor to guide or monitor treatment for MRSA infections. Performed at [facility], [place] [address] [place], [place] [address]       Studies: DG CHEST PORT 1 VIEW  Result Date: 11/11/2019 CLINICAL DATA:  COVID-19 infection. EXAM: PORTABLE CHEST 1 VIEW COMPARISON:  11/09/2019; 12/25/202021; 10/10/2019; chest CT-11/08/2019 FINDINGS: Grossly unchanged cardiac silhouette and mediastinal contours with atherosclerotic plaque within the thoracic aorta. Pulmonary vasculature remains indistinct with cephalization of flow and interval increase in size of small layering bilateral pleural effusions and associated bibasilar heterogeneous opacities,  right greater than left. A small amount of fluid is seen tracking within right minor fissure. No pneumothorax. Enteric tube tip and side port project over the gastric fundus. No acute osseous abnormalities. Degenerative change of the right glenohumeral joint, incompletely evaluated. Old/healed fractures involving posterolateral aspects of the left fourth through sixth ribs as well as the right fourth and fifth ribs. IMPRESSION: Findings most suggestive of progressive pulmonary edema, now with small layering bilateral effusions and worsening bibasilar opacities, right greater than  left, atelectasis versus infiltrate. Electronically Signed   By: Sandi Mariscal M.D.   On: 11/11/2019 08:20    Scheduled Meds: . vitamin C  500 mg Per Tube Daily  . aspirin  81 mg Per Tube Daily  . atenolol  25 mg Per Tube Daily  . Chlorhexidine Gluconate Cloth  6 each Topical Daily  . dexamethasone (DECADRON) injection  6 mg Intravenous Q24H  . docusate  100 mg Per Tube BID  . donepezil  10 mg Per Tube QHS  . insulin aspart  0-9 Units Subcutaneous Q4H  . mouth rinse  15 mL Mouth Rinse BID  . memantine  10 mg Per Tube Daily  . zinc sulfate  220 mg Per Tube Daily    Continuous Infusions: . ceFEPime (MAXIPIME) IV Stopped (11/11/19 0247)  . dextrose 5 % and 0.9% NaCl    . diltiazem (CARDIZEM) infusion 2.5 mg/hr (11/10/19 1200)  . feeding supplement (OSMOLITE 1.2 CAL) Stopped (11/11/19 0000)  . heparin 850 Units/hr (11/11/19 0021)  . remdesivir 100 mg in NS 100 mL 100 mg (11/11/19 1008)  . vancomycin Stopped (11/09/19 1939)     LOS: 4 days     Annita Brod, MD [facility]  To reach me or the doctor on call, go to: [email or web address] Password [email or web address]  11/11/2019, 2:40 PM

## 2019-11-11 NOTE — Progress Notes (Addendum)
1901 Report from [facility] Shift assessment and VS taken. VS WDL. Heparin drip at 8.5 ml/hr. Cardizem at 5 ml/hr. D5NS at 100 ml/hr. Mouth care and oral suction. Peri care and new puriwick placed. Pt repositioned in the bed. Bilateral mittens on patient. Call bell in reach. Bed alarmed. Room air.  0400 Bed bath, new gown applied. Oral care. Pt refusing to be suctioned. Suctioned minimally due to pt keeps biting on Yankauer catheter.   0700 Report given to [employer]

## 2019-11-11 NOTE — Progress Notes (Addendum)
ANTICOAGULATION CONSULT NOTE - Follow Up Consult  Pharmacy Consult for Heparin Indication: bilateral PE and RLE DVT  Patient Measurements: Height: 5\' 2"  (157.5 cm) Weight: 107 lb 5.8 oz (48.7 kg) IBW/kg (Calculated) : 50.1 Heparin Dosing Weight: TBW  Vital Signs: Temp: 98.2 F (36.8 C) (01/03 0415) Temp Source: Axillary (01/03 0415) BP: 134/67 (01/03 0415) Pulse Rate: 109 (01/03 0415)  Labs: Recent Labs    11/08/19 1005 11/08/19 1140 11/09/19 0320 11/09/19 2120 11/10/19 0413 11/11/19 0144 11/11/19 0500  HGB  --   --  9.9*  --  10.1* 10.0*  --   HCT  --   --  31.6*  --  31.2* 31.2*  --   PLT  --   --  105*  --  127* 143*  --   HEPARINUNFRC  --   --  0.34 0.40 0.48  --  0.39  CREATININE  --   --  0.78  --  0.71 0.64  --   TROPONINIHS 16 17  --   --   --   --   --     Estimated Creatinine Clearance: 30.9 mL/min (by C-G formula based on SCr of 0.64 mg/dL).  Assessment: 53 yoF admitted on 12/30 with COVID-19 pneumonia.  She was initially started on Heparin [place] for VTE prophylaxis.  Pharmacy is now consulted to change to IV heparin for VTE. Most recent dose Heparin 5000 units [place] TID - on 12/31 at 06:00 CTa +bilateral PE, No evidence of right heart strain Dopplers: Right: acute DVT involving the right common femoral vein, SF junction, femoral vein, proximal profunda vein, popliteal vein, right posterior tibial veins, and right peroneal veins  12/31 Bleeding: RN reports IV site bled when IV was removed on 12/31, dressing was applied and bleeding stopped.  Then about an hour later it had another large bleed. Initially controlled, then bleeding recurred. Thrombipad now applied, MD aware.      11/11/2019  0500UPDATE  Heparin level: 0.39 IU/mL-->remains within therapeutic goal range -Per RN, no bleeding complications noted  -Hb remains ~10 mg/dL  with  platelets now 143K -D-Dimer has increased from  11.5 to 14.52 mcg/mL-FEU -RN reports no bleeding complications   Goal of  Therapy:  Heparin level 0.3-0.7 units/ml Monitor platelets by anticoagulation protocol: Yes   Plan:   Continue heparin infusion rate at 850 units/hr   Daily heparin level  and CBC    Continue to monitor H&H and platelets  Despina Pole, Pharm. D. Clinical Pharmacist 11/11/2019 4:51 AM

## 2019-11-11 NOTE — Progress Notes (Addendum)
Contacted R. David MD about patient spitting up tube feed consistency and color liquid. This nurse stopped the tube feed until further instruction. Will continue to monitor.   @[phone]  Loralee Pacas MD put in orders to hold tube feeds.

## 2019-11-11 NOTE — Progress Notes (Signed)
Pharmacy Antibiotic Note  Sue Schaefer is a 86 y.o. female admitted on 10/17/2019 with possible endocarditis.  Pharmacy has been consulted for cefepime and vanomycin dosing.  Presenting with difficulty breathing and AMS - found to be COVID + PCT 0.21>0.66>0.43 Scr 0.64, crcl 68mL/min   Plan: Vancomycin 1 g IV once then 500 mg IV every 48 hours (estAUC 446)  Continue Cefepime 2g IV q24h. Monitor renal fx, cx results, will plan for vancomycin levels on 1/5 around the 4th dose  Height: 5\' 2"  (157.5 cm) Weight: 107 lb 5.8 oz (48.7 kg) IBW/kg (Calculated) : 50.1  Temp (24hrs), Avg:98.4 F (36.9 C), Min:98 F (36.7 C), Max:98.9 F (37.2 C)  Recent Labs  Lab 10/19/2019 1645 10/16/2019 1843 10/09/2019 2203 11/08/19 0029 11/08/19 0605 11/08/19 1140 11/08/19 1445 11/09/19 0320 11/10/19 0413 11/11/19 0144  WBC 21.6*  --   --   --  19.3*  --   --  15.3* 16.4* 20.2*  CREATININE 1.33*  --  1.13*  --  0.92  --   --  0.78 0.71 0.64  LATICACIDVEN 4.2* 4.0*  --  3.1* 3.0* 3.3* 3.0*  --   --   --     Estimated Creatinine Clearance: 30.9 mL/min (by C-G formula based on SCr of 0.64 mg/dL).     Antimicrobials this admission: Vancomycin 12/30>> Aztreonam 12/30>>12/31 Remdesivir 12/31>>1/4 Actemra 12/31 Cefepime 12/31 >   Dose adjustments this admission:  Microbiology results: 12/30 BCx: ngtd 12/30 UCx: NG 12/31 MRSA PCR: negative  Thank you for allowing pharmacy to be a part of this patient's care.  Heide Guile, PharmD, BCPS-AQ ID Clinical Pharmacist Pager [phone]  11/11/2019 8:35 AM

## 2019-11-12 LAB — COMPREHENSIVE METABOLIC PANEL
ALT: 30 U/L (ref 0–44)
AST: 18 U/L (ref 15–41)
Albumin: 2.6 g/dL — ABNORMAL LOW (ref 3.5–5.0)
Alkaline Phosphatase: 48 U/L (ref 38–126)
Anion gap: 9 (ref 5–15)
BUN: 42 mg/dL — ABNORMAL HIGH (ref 8–23)
CO2: 20 mmol/L — ABNORMAL LOW (ref 22–32)
Calcium: 7.9 mg/dL — ABNORMAL LOW (ref 8.9–10.3)
Chloride: 111 mmol/L (ref 98–111)
Creatinine, Ser: 0.56 mg/dL (ref 0.44–1.00)
GFR calc Af Amer: >60 mL/min (ref >60)
GFR calc non Af Amer: >60 mL/min (ref >60)
Glucose, Bld: 231 mg/dL — ABNORMAL HIGH (ref 70–99)
Potassium: 3.7 mmol/L (ref 3.5–5.1)
Sodium: 140 mmol/L (ref 135–145)
Total Bilirubin: 1.2 mg/dL (ref 0.3–1.2)
Total Protein: 4.9 g/dL — ABNORMAL LOW (ref 6.5–8.1)

## 2019-11-12 LAB — CBC WITH DIFFERENTIAL/PLATELET
Abs Immature Granulocytes: 0.53 10*3/uL — ABNORMAL HIGH (ref 0.00–0.07)
Basophils Absolute: 0.1 10*3/uL (ref 0.0–0.1)
Basophils Relative: 0 %
Eosinophils Absolute: 0 10*3/uL (ref 0.0–0.5)
Eosinophils Relative: 0 %
HCT: 31.3 % — ABNORMAL LOW (ref 36.0–46.0)
Hemoglobin: 10 g/dL — ABNORMAL LOW (ref 12.0–15.0)
Immature Granulocytes: 2 %
Lymphocytes Relative: 4 %
Lymphs Abs: 1.2 10*3/uL (ref 0.7–4.0)
MCH: 28.7 pg (ref 26.0–34.0)
MCHC: 31.9 g/dL (ref 30.0–36.0)
MCV: 89.7 fL (ref 80.0–100.0)
Monocytes Absolute: 1.7 10*3/uL — ABNORMAL HIGH (ref 0.1–1.0)
Monocytes Relative: 5 %
Neutro Abs: 28.6 10*3/uL — ABNORMAL HIGH (ref 1.7–7.7)
Neutrophils Relative %: 89 %
Platelets: 169 10*3/uL (ref 150–400)
RBC: 3.49 MIL/uL — ABNORMAL LOW (ref 3.87–5.11)
RDW: 14.9 % (ref 11.5–15.5)
WBC: 32.1 10*3/uL — ABNORMAL HIGH (ref 4.0–10.5)
nRBC: 2.1 % — ABNORMAL HIGH (ref 0.0–0.2)

## 2019-11-12 LAB — GLUCOSE, CAPILLARY
Glucose-Capillary: 148 mg/dL — ABNORMAL HIGH (ref 70–99)
Glucose-Capillary: 158 mg/dL — ABNORMAL HIGH (ref 70–99)
Glucose-Capillary: 167 mg/dL — ABNORMAL HIGH (ref 70–99)
Glucose-Capillary: 170 mg/dL — ABNORMAL HIGH (ref 70–99)
Glucose-Capillary: 175 mg/dL — ABNORMAL HIGH (ref 70–99)
Glucose-Capillary: 184 mg/dL — ABNORMAL HIGH (ref 70–99)
Glucose-Capillary: 189 mg/dL — ABNORMAL HIGH (ref 70–99)
Glucose-Capillary: 190 mg/dL — ABNORMAL HIGH (ref 70–99)
Glucose-Capillary: 195 mg/dL — ABNORMAL HIGH (ref 70–99)
Glucose-Capillary: 214 mg/dL — ABNORMAL HIGH (ref 70–99)

## 2019-11-12 LAB — PHOSPHORUS: Phosphorus: 2.3 mg/dL — ABNORMAL LOW (ref 2.5–4.6)

## 2019-11-12 LAB — CULTURE, BLOOD (ROUTINE X 2)
Culture: NO GROWTH
Culture: NO GROWTH
Special Requests: ADEQUATE
Special Requests: ADEQUATE

## 2019-11-12 LAB — HEPARIN LEVEL (UNFRACTIONATED)
Heparin Unfractionated: 0.9 IU/mL — ABNORMAL HIGH (ref 0.30–0.70)
Heparin Unfractionated: 0.82 IU/mL — ABNORMAL HIGH (ref 0.30–0.70)

## 2019-11-12 LAB — LACTIC ACID, PLASMA
Lactic Acid, Venous: 2.4 mmol/L (ref 0.5–1.9)
Lactic Acid, Venous: 2.4 mmol/L (ref 0.5–1.9)
Lactic Acid, Venous: 2.2 mmol/L (ref 0.5–1.9)

## 2019-11-12 LAB — D-DIMER, QUANTITATIVE: D-Dimer, Quant: 12.35 ug/mL-FEU — ABNORMAL HIGH (ref 0.00–0.50)

## 2019-11-12 LAB — MAGNESIUM: Magnesium: 2.1 mg/dL (ref 1.7–2.4)

## 2019-11-12 LAB — C-REACTIVE PROTEIN: CRP: 2.1 mg/dL — ABNORMAL HIGH (ref <1.0)

## 2019-11-12 LAB — FERRITIN: Ferritin: 393 ng/mL — ABNORMAL HIGH (ref 11–307)

## 2019-11-12 LAB — PROCALCITONIN: Procalcitonin: <0.1 ng/mL

## 2019-11-12 MED ORDER — METRONIDAZOLE IN NACL 5-0.79 MG/ML-% IV SOLN
500.0000 mg | Freq: Three times a day (TID) | INTRAVENOUS | Status: DC
  Administered 2019-11-12 – 2019-11-15 (×10): 500 mg via INTRAVENOUS
  Filled 2019-11-12 (×12): qty 100

## 2019-11-12 MED ORDER — ONDANSETRON HCL 4 MG/2ML IJ SOLN: 4.0000 mg | Freq: Four times a day (QID) | INTRAMUSCULAR | Status: DC | PRN

## 2019-11-12 MED ORDER — BISACODYL 10 MG RE SUPP
10.0000 mg | Freq: Once | RECTAL | Status: AC
  Administered 2019-11-12: 10 mg via RECTAL
  Filled 2019-11-12: qty 1

## 2019-11-12 MED ORDER — SODIUM CHLORIDE 0.9 % IV SOLN
2.0000 g | INTRAVENOUS | Status: DC
  Administered 2019-11-12 – 2019-11-15 (×4): 2 g via INTRAVENOUS
  Filled 2019-11-12 (×5): qty 2

## 2019-11-12 NOTE — Progress Notes (Signed)
PROGRESS NOTE  Sue Schaefer WLN:[id number] DOB: 1920-04-11 DOA: 10/19/2019 PCP: Dettinger, Fransisca Kaufmann, MD  HPI/Recap of past 67 hours: 86 year old female with past medical history of dementia, hypertension and CAD who presented to the emergency room with shortness of breath on 12/30.  Patient had reportedly tested positive for Covid 3 weeks prior but had been asymptomatic at that time.  She was found to be hypoxic, hypernatremic, septic and nonverbal, with her baseline being normally interactive and talkative.  Admitted to the hospitalist service and started on IV fluids and antibiotics.  Patient was discovered by CT scan to have a pulmonary embolus secondary to an acute right lower extremity DVT and started on anticoagulation on 12/31.  An echocardiogram done to assess her pulmonary embolus noted a small mobile tricuspid valve vegetation, possibly a thrombus as well as moderate to severe mitral regurg noted.  In discussion with cardiology, they recommended treatment with IV antibiotics for 6 weeks.  Since admission, she has remained minimally responsive although she has been able to be weaned off of oxygen.  An NG tube for tube feeds has been placed.  Follow-up lactic acid level on 1/3 elevated at 2.5 which improved with IV fluids, but then back up again today to 2.5.  Patient herself about the same, does not really awaken for me, does not follow commands.  White count significantly increased up to 32.  Nursing reported episodes of vomiting earlier.  Assessment/Plan: Active Problems:   Dementia with behavioral disturbance (HCC)/acute metabolic encephalopathy: Persistent.  Even with correction of hyponatremia.  Likely worsened due to sepsis.    Hyperlipidemia LDL goal <130    Essential hypertension, benign: Blood pressure stable.    CAD (coronary artery disease) of artery bypass graft: Stable.  Sepsis secondary to pneumonia due to COVID-19 virus causing acute respiratory failure with hypoxia:  Patient met criteria for sepsis on admission given acute metabolic encephalopathy, lactic acid level of 4.2, fever and tachycardia with pulmonary source and secondary hypoxia.  With some IV fluids and antibiotics, sepsis improved, but lactic acid still not yet normalized.  Lactic acid trending back upward and white count significantly elevated today at 32.  Allergic to penicillin, so we will add Flagyl to cover for anaerobes.  Continue antibiotics, Remdisivir and steroids.  Patient initially required high flow nasal cannula to keep oxygen saturations above 90%.  Over the next few days, this was able to be weaned off completely.  Severe protein calorie malnutrition/Refeeding syndrome: Nutrition to see.  Feeding tube placed.  Received 1 dose of IV albumin.  Holding on feeding for now   Acute kidney injury in the setting of CKD (chronic kidney disease), stage III: Renal function has since stabilized.  Acute kidney injury secondary to sepsis.  Bilateral pulmonary emboli secondary to acute right lower extremity DVT: On heparin.  Hypernatremia: Secondary to dehydration from sepsis.  Looks to have since resolved although her mentation has not changed at all.  Moderate to severe mitral regurg: Monitor closely as we are aggressively rehydrating her.  Suspected endocarditis: Case discussed with infectious disease and cardiology.  Not a surgical candidate given her advanced age, comorbidities and tenuous status.  Plan is for 6 weeks of IV antibiotics and then follow-up echocardiogram  Code Status: DNR  Family Communication: Updated daughter by phone   Disposition Plan: To be determined.  Patient may not make a good recovery from hospitalization.   Consultants:  None  Procedures:  None  Antimicrobials:  IV Azactam 12/30-12/31  IV  Remdisivir 12/30-present  IV cefepime 12/31-present  IV vancomycin 12/30-present  IV Flagyl 1/4-present  DVT prophylaxis: Heparin drip   Objective: Vitals:    11/12/19 0800 11/12/19 1300  BP: (!) 124/53 (!) 120/55  Pulse:    Resp: (!) 22 20  Temp: 98.7 F (37.1 C)   SpO2: 99% 98%    Intake/Output Summary (Last 24 hours) at 11/12/2019 1449 Last data filed at 11/12/2019 1200 Gross per 24 hour  Intake 1591.94 ml  Output 1600 ml  Net -8.06 ml   Filed Weights   11/08/19 0315 11/10/19 0500 11/11/19 0408  Weight: 44.2 kg 44.7 kg 48.7 kg   Body mass index is 19.64 kg/m.  Exam:   General: Emaciated, minimally responsive, does not appear to be in any acute distress  HEENT: Normocephalic, mucous membranes quite dry  Neck: Supple, no JVD  Cardiovascular: Regular rate and rhythm, S1-S2  Respiratory: Decreased breath sounds throughout, few Rales  Abdomen: Soft, nondistended, hypoactive bowel sounds  Musculoskeletal: No clubbing or cyanosis, or edema  Skin: Poor turgor, dry skin  Psychiatry: Does not respond to voice or commands   Data Reviewed: CBC: Recent Labs  Lab 11/08/19 0605 11/09/19 0320 11/10/19 0413 11/11/19 0144 11/12/19 0700  WBC 19.3* 15.3* 16.4* 20.2* 32.1*  NEUTROABS 17.8* 14.1* 14.9* 17.5* 28.6*  HGB 11.2* 9.9* 10.1* 10.0* 10.0*  HCT 36.6 31.6* 31.2* 31.2* 31.3*  MCV 94.1 94.3 89.4 89.1 89.7  PLT 100* 105* 127* 143* 254   Basic Metabolic Panel: Recent Labs  Lab 11/08/19 0605 11/08/19 1655 11/09/19 0320 11/10/19 0413 11/11/19 0144 11/12/19 0700  NA 149*  --  148* 143 140 140  K 3.9  --  3.2* 4.0 3.6 3.7  CL 117*  --  122* 116* 110 111  CO2 18*  --  16* 17* 17* 20*  GLUCOSE 165*  --  219* 273* 231* 231*  BUN 35*  --  46* 45* 43* 42*  CREATININE 0.92  --  0.78 0.71 0.64 0.56  CALCIUM 7.6*  --  7.8* 8.0* 7.8* 7.9*  MG 2.1 2.1 2.2 2.0 2.0 2.1  PHOS 2.3* 2.0* 1.7* 1.4* 2.3* 2.3*   GFR: Estimated Creatinine Clearance: 30.9 mL/min (by C-G formula based on SCr of 0.56 mg/dL). Liver Function Tests: Recent Labs  Lab 11/08/19 0605 11/09/19 0320 11/10/19 0413 11/11/19 0144 11/12/19 0700  AST 19  60* 32 22 18  ALT 15 45* 42 36 30  ALKPHOS 57 46 57 53 48  BILITOT 1.1 1.2 1.1 1.2 1.2  PROT 5.9* 5.9* 5.4* 5.2* 4.9*  ALBUMIN 2.5* 3.3* 2.7* 2.8* 2.6*   No results for input(s): LIPASE, AMYLASE in the last 168 hours. No results for input(s): AMMONIA in the last 168 hours. Coagulation Profile: Recent Labs  Lab 10/18/2019 1645  INR 1.5*   Cardiac Enzymes: No results for input(s): CKTOTAL, CKMB, CKMBINDEX, TROPONINI in the last 168 hours. BNP (last 3 results) No results for input(s): PROBNP in the last 8760 hours. HbA1C: No results for input(s): HGBA1C in the last 72 hours. CBG: Recent Labs  Lab 11/11/19 1921 11/11/19 2351 11/12/19 0405 11/12/19 0832 11/12/19 1257  GLUCAP 161* 184* 214* 190* 189*   Lipid Profile: No results for input(s): CHOL, HDL, LDLCALC, TRIG, CHOLHDL, LDLDIRECT in the last 72 hours. Thyroid Function Tests: No results for input(s): TSH, T4TOTAL, FREET4, T3FREE, THYROIDAB in the last 72 hours. Anemia Panel: Recent Labs    11/11/19 0500 11/12/19 0700  FERRITIN 735* 393*   Urine analysis:  Component Value Date/Time   COLORURINE YELLOW 10/14/2019 1640   APPEARANCEUR HAZY (A) 10/26/2019 1640   APPEARANCEUR Clear 07/14/2018 1352   LABSPEC 1.017 10/10/2019 1640   PHURINE 5.0 10/30/2019 1640   GLUCOSEU NEGATIVE 10/24/2019 1640   HGBUR NEGATIVE 11/03/2019 1640   BILIRUBINUR NEGATIVE 10/20/2019 1640   BILIRUBINUR Negative 07/14/2018 1352   KETONESUR NEGATIVE 10/22/2019 1640   PROTEINUR NEGATIVE 11/08/2019 1640   UROBILINOGEN negative 01/06/2016 1638   NITRITE NEGATIVE 11/08/2019 1640   LEUKOCYTESUR NEGATIVE 10/11/2019 1640   Sepsis Labs: _0 (procalcitonin:4,lacticidven:4)  ) Recent Results (from the past 240 hour(s))  Blood Culture (routine x 2)     Status: None   Collection Time: 10/15/2019  4:18 PM   Specimen: Right Antecubital; Blood  Result Value Ref Range Status   Specimen Description RIGHT ANTECUBITAL  Final   Special Requests    Final    BOTTLES DRAWN AEROBIC AND ANAEROBIC Blood Culture adequate volume   Culture   Final    NO GROWTH 5 DAYS Performed at [facility], [address] [place], [place] [address]    Report Status 11/12/2019 FINAL  Final  Urine culture     Status: None   Collection Time: 11/01/2019  4:40 PM   Specimen: In/Out Cath Urine  Result Value Ref Range Status   Specimen Description   Final    IN/OUT CATH URINE Performed at [facility], [address], [place], [place] [address]    Special Requests   Final    NONE Performed at [facility], [address], [place], [place] [address]    Culture   Final    NO GROWTH Performed at [facility], [place] [address], [place], [place] [address]    Report Status 11/08/2019 FINAL  Final  Blood Culture (routine x 2)     Status: None   Collection Time: 11/06/2019  4:45 PM   Specimen: Left Antecubital; Blood  Result Value Ref Range Status   Specimen Description LEFT ANTECUBITAL  Final   Special Requests   Final    BOTTLES DRAWN AEROBIC AND ANAEROBIC Blood Culture adequate volume   Culture   Final    NO GROWTH 5 DAYS Performed at [facility], [address], [place], [place] [address]    Report Status 11/12/2019 FINAL  Final  MRSA PCR Screening     Status: None   Collection Time: 11/08/19  2:59 AM   Specimen: Nasal Mucosa; Nasopharyngeal  Result Value Ref Range Status   MRSA by PCR NEGATIVE NEGATIVE Final    Comment:        The GeneXpert MRSA Assay (FDA approved for NASAL specimens only), is one component of a comprehensive MRSA colonization surveillance program. It is not intended to diagnose MRSA infection nor to guide or monitor treatment for MRSA infections. Performed at [facility], [place] [address], [place], [place] [address]       Studies: CT HEAD WO CONTRAST  Result Date: 11/11/2019 CLINICAL DATA:  Focal neural deficit. Encephalopathy. EXAM: CT HEAD WITHOUT CONTRAST TECHNIQUE: Contiguous axial images were obtained from  the base of the skull through the vertex without intravenous contrast. COMPARISON:  None. FINDINGS: Brain: Mild diffuse cortical atrophy is noted. No mass effect or midline shift is noted. Ventricular size is within normal limits. There is no evidence of mass lesion, hemorrhage or acute infarction. Vascular: No hyperdense vessel or unexpected calcification. Skull: Normal. Negative for fracture or focal lesion. Sinuses/Orbits: No acute finding. Other: None. IMPRESSION:  Mild diffuse cortical atrophy. No acute intracranial abnormality seen. Electronically Signed   By: Marijo Conception M.D.   On: 11/11/2019 16:35    Scheduled Meds: . vitamin C  500 mg Per Tube Daily  . aspirin  81 mg Per Tube Daily  . atenolol  25 mg Per Tube Daily  . Chlorhexidine Gluconate Cloth  6 each Topical Daily  . dexamethasone (DECADRON) injection  6 mg Intravenous Q24H  . docusate  100 mg Per Tube BID  . donepezil  10 mg Per Tube QHS  . insulin aspart  0-9 Units Subcutaneous Q4H  . mouth rinse  15 mL Mouth Rinse BID  . memantine  10 mg Per Tube Daily  . zinc sulfate  220 mg Per Tube Daily    Continuous Infusions: . ceFEPime (MAXIPIME) IV 2 g (11/12/19 1057)  . dextrose 5 % and 0.9% NaCl Stopped (11/12/19 0658)  . diltiazem (CARDIZEM) infusion Stopped (11/12/19 1610)  . feeding supplement (OSMOLITE 1.2 CAL) Stopped (11/11/19 0000)  . heparin 750 Units/hr (11/12/19 0730)  . metronidazole 500 mg (11/12/19 1103)  . vancomycin Stopped (11/11/19 1902)     LOS: 5 days     Annita Brod, MD [facility]  To reach me or the doctor on call, go to: [email or web address] Password [facility]  11/12/2019, 2:49 PM

## 2019-11-12 NOTE — Progress Notes (Signed)
ANTICOAGULATION CONSULT NOTE - Follow Up Consult  Pharmacy Consult for Heparin Indication: bilateral PE and RLE DVT  Patient Measurements: Height: 5\' 2"  (157.5 cm) Weight: 107 lb 5.8 oz (48.7 kg) IBW/kg (Calculated) : 50.1 Heparin Dosing Weight: TBW  Vital Signs: Temp: 98.3 F (36.8 C) (01/04 1700) Temp Source: Axillary (01/04 1700) BP: 120/55 (01/04 1300)  Labs: Recent Labs    11/10/19 0413 11/11/19 0144 11/11/19 0500 11/12/19 0700 11/12/19 1645  HGB 10.1* 10.0*  --  10.0*  --   HCT 31.2* 31.2*  --  31.3*  --   PLT 127* 143*  --  169  --   HEPARINUNFRC 0.48  --  0.39 0.90* 0.82*  CREATININE 0.71 0.64  --  0.56  --     Estimated Creatinine Clearance: 30.9 mL/min (by C-G formula based on SCr of 0.56 mg/dL).  Assessment: 26 yoF admitted on 12/30 with COVID-19 pneumonia.  She was initially started on Heparin [place] for VTE prophylaxis.  Pharmacy is now consulted to change to IV heparin for VTE. Most recent dose Heparin 5000 units [place] TID - on 12/31 at 06:00 CTa +bilateral PE, No evidence of right heart strain Dopplers: Right: acute DVT involving the right common femoral vein, SF junction, femoral vein, proximal profunda vein, popliteal vein, right posterior tibial veins, and right peroneal veins  12/31 Bleeding: RN reports IV site bled when IV was removed on 12/31, dressing was applied and bleeding stopped.  Then about an hour later it had another large bleed. Initially controlled, then bleeding recurred. Thrombipad now applied, MD aware.     Heparin still came back elevated after dose adjustment. We will reduce dose again and recheck level.    Goal of Therapy:  Heparin level 0.3-0.7 units/ml Monitor platelets by anticoagulation protocol: Yes   Plan:   Decrease heparin infusion to 650 units/hr  Recheck HL in 8 hours  Daily heparin level and CBC    Continue to monitor H&H and platelets  Onnie Boer, PharmD, BCIDP, AAHIVP, CPP Infectious Disease Pharmacist 11/12/2019  6:11 PM

## 2019-11-12 NOTE — Progress Notes (Signed)
Nutrition Follow-up  DOCUMENTATION CODES:   Underweight  INTERVENTION:   Recommend resume TF as able Osmolite 1.2 @ 55 ml/hr via NG tube  Provides: 1584 kcal, 73 grams protein, and 1070 ml free water.   Recommend bowel regimen for constipation  NUTRITION DIAGNOSIS:   Increased nutrient needs related to acute illness(COVID-19) as evidenced by estimated needs. Ongoing.   GOAL:   Patient will meet greater than or equal to 90% of their needs Not met.   MONITOR:   TF tolerance  REASON FOR ASSESSMENT:   Consult Enteral/tube feeding initiation and management  ASSESSMENT:   Pt with PMH of dementia, HTN, vitamin D deficiency who tested positive for COVID-19 three weeks ago. Now admitted with hypoxia and likely sepsis from superimposed bacterial infection.   Pt currently nonverbal, which is not her baseline. Pt made NPO. Pt now DNR but not comfort care.   1/3 per RN documentation pt with dark brown spit up, TF held. Reviewed chart per abd xray 1/1 pt with moderate stool burden only on colace, messaged MD Noted blood sugars remain elevated >180 despite TF being off.   Medications reviewed and include: vitamin C, decadron, colace, SSI, zinc, vancomycin  Labs reviewed: PO4: 2.3 (L) CBG's: [phone]    NUTRITION - FOCUSED PHYSICAL EXAM:  Deferred   Diet Order:   Diet Order            Diet NPO time specified  Diet effective now              EDUCATION NEEDS:   No education needs have been identified at this time  Skin:  Skin Assessment: (MASD/skin tear: Groin)  Last BM:  12/31  Height:   Ht Readings from Last 1 Encounters:  11/08/19 5' 2"  (1.575 m)    Weight:   Wt Readings from Last 1 Encounters:  11/11/19 48.7 kg    Ideal Body Weight:  50 kg  BMI:  Body mass index is 19.64 kg/m.  Estimated Nutritional Needs:   Kcal:  1500-1700  Protein:  65-80 grams  Fluid:  > 1.5 L/day  Maylon Peppers RD, LDN, CNSC [phone] Pager [phone] After  Hours Pager

## 2019-11-12 NOTE — Progress Notes (Signed)
ANTICOAGULATION CONSULT NOTE - Follow Up Consult  Pharmacy Consult for Heparin Indication: bilateral PE and RLE DVT  Patient Measurements: Height: 5\' 2"  (157.5 cm) Weight: 107 lb 5.8 oz (48.7 kg) IBW/kg (Calculated) : 50.1 Heparin Dosing Weight: TBW  Vital Signs: Temp: 98.7 F (37.1 C) (01/04 0800) Temp Source: Axillary (01/04 0800) BP: 124/53 (01/04 0800)  Labs: Recent Labs    11/10/19 0413 11/11/19 0144 11/11/19 0500 11/12/19 0700  HGB 10.1* 10.0*  --  10.0*  HCT 31.2* 31.2*  --  31.3*  PLT 127* 143*  --  169  HEPARINUNFRC 0.48  --  0.39 0.90*  CREATININE 0.71 0.64  --  0.56    Estimated Creatinine Clearance: 30.9 mL/min (by C-G formula based on SCr of 0.56 mg/dL).  Assessment: 7 yoF admitted on 12/30 with COVID-19 pneumonia.  She was initially started on Heparin [place] for VTE prophylaxis.  Pharmacy is now consulted to change to IV heparin for VTE. Most recent dose Heparin 5000 units  TID - on 12/31 at 06:00 CTa +bilateral PE, No evidence of right heart strain Dopplers: Right: acute DVT involving the right common femoral vein, SF junction, femoral vein, proximal profunda vein, popliteal vein, right posterior tibial veins, and right peroneal veins  12/31 Bleeding: RN reports IV site bled when IV was removed on 12/31, dressing was applied and bleeding stopped.  Then about an hour later it had another large bleed. Initially controlled, then bleeding recurred. Thrombipad now applied, MD aware.      11/12/2019  -HL elevated at 0.9, level drawn correctly (phlebotomist confirmed) -no bleeding reported   Goal of Therapy:  Heparin level 0.3-0.7 units/ml Monitor platelets by anticoagulation protocol: Yes   Plan:   Decrease heparin infusion to 750 units/hr  Recheck HL in 8 hours  Daily heparin level and CBC    Continue to monitor H&H and platelets  Ulice Dash, PharmD, BCPS 11/12/2019 9:00 AM

## 2019-11-12 NOTE — Progress Notes (Signed)
Spoke with daughter Vaughan Basta. Updated on pt status and care plan for the evening. Answered all questions to best of ability. Daughter appreciative of all care given to her mother.

## 2019-11-12 NOTE — TOC Initial Note (Signed)
Transition of Care [facility]) - Initial/Assessment Note    Patient Details  Name: Sue Schaefer MRN: [id number] Date of Birth: 02/02/1920  [facility] CM/SW Contact:    Shade Flood, LCSW Phone Number: 11/12/2019, 10:19 AM  Clinical Narrative:                  Pt resides at [facility]. At baseline, she has dementia but is talkative and interactive. Chart notes indicate pt is minimally responsive at this time and has suspected endocarditis and needs 6 weeks IV antibiotics.   Anticipating pt will need SNF at dc. TOC will follow and continue to assess as pt's stay progresses.   Expected Discharge Plan: Skilled Nursing Facility Barriers to Discharge: Continued Medical Work up   Patient Goals and CMS Choice        Expected Discharge Plan and Services Expected Discharge Plan: [place] In-house Referral: Clinical Social Work     Living arrangements for the past 2 months: [place]                                      Prior Living Arrangements/Services Living arrangements for the past 2 months: [place] Lives with:: Facility Resident Patient language and need for interpreter reviewed:: Yes        Need for Family Participation in Patient Care: No (Comment) Care giver support system in place?: Yes (comment)   Criminal Activity/Legal Involvement Pertinent to Current Situation/Hospitalization: No - Comment as needed  Activities of Daily Living Home Assistive Devices/Equipment: Eyeglasses, Hearing aid, Wheelchair ADL Screening (condition at time of admission) Patient's cognitive ability adequate to safely complete daily activities?: Yes Is the patient deaf or have difficulty hearing?: Yes Does the patient have difficulty seeing, even when wearing glasses/contacts?: Yes Does the patient have difficulty concentrating, remembering, or making decisions?: No Patient able to express need for assistance with ADLs?:  Yes Does the patient have difficulty dressing or bathing?: Yes Independently performs ADLs?: No Communication: Independent Dressing (OT): Appropriate for developmental age, Needs assistance Is this a change from baseline?: Pre-admission baseline Grooming: Appropriate for developmental age, Needs assistance Is this a change from baseline?: Pre-admission baseline Feeding: Appropriate for developmental age, Needs assistance Is this a change from baseline?: Pre-admission baseline Bathing: Appropriate for developmental age, Needs assistance Is this a change from baseline?: Pre-admission baseline Toileting: Appropriate for developmental age, Needs assistance Is this a change from baseline?: Pre-admission baseline In/Out Bed: Appropriate for developmental age, Needs assistance Is this a change from baseline?: Pre-admission baseline Walks in Home: Needs assistance, Appropriate for developmental age Is this a change from baseline?: Pre-admission baseline Does the patient have difficulty walking or climbing stairs?: Yes Weakness of Legs: Both Weakness of Arms/Hands: None  Permission Sought/Granted                  Emotional Assessment       Orientation: : Oriented to Self Alcohol / Substance Use: Not Applicable Psych Involvement: No (comment)  Admission diagnosis:  Acute respiratory failure with hypoxia ([place]) [J96.01] HCAP (healthcare-associated pneumonia) [J18.9] Septic shock ([place]) [A41.9, R65.21] Pneumonia due to COVID-19 virus [U07.1, J12.82] COVID-19 [U07.1] Patient Active Problem List   Diagnosis Date Noted  . CKD (chronic kidney disease), stage III 11/11/2019  . Sepsis ([place]) 11/11/2019  . Hypernatremia 11/11/2019  . Acute metabolic encephalopathy [id number]  . Bilateral pulmonary embolism ([place]) 11/11/2019  .  Acute respiratory failure with hypoxia ([place]) 11/11/2019  . Severe protein-calorie malnutrition ([place]) 11/11/2019  . Refeeding syndrome 11/10/2019  . Pneumonia due  to COVID-19 virus 11/08/2019  . COVID-19 10/22/2019  . Essential hypertension, benign 04/03/2013  . CAD (coronary artery disease) of artery bypass graft 04/03/2013  . Dementia with behavioral disturbance ([place]) 01/29/2013  . Hyperlipidemia LDL goal <130 01/29/2013  . Hearing loss 01/29/2013  . Coronary artery disease 01/29/2013  . Osteopenia 01/29/2013   PCP:  Dettinger, Fransisca Kaufmann, MD Pharmacy:   [place], [place] [address] Phone: [phone] Fax: [phone]  [place], [place] [address] [place] [address] Phone: [phone] Fax: [phone]     Social Determinants of Health (SDOH) Interventions    Readmission Risk Interventions Readmission Risk Prevention Plan 11/12/2019  Transportation Screening Complete  Medication Review Press photographer) Complete  Some recent data might be hidden

## 2019-11-13 LAB — BASIC METABOLIC PANEL
Anion gap: 6 (ref 5–15)
BUN: 33 mg/dL — ABNORMAL HIGH (ref 8–23)
CO2: 19 mmol/L — ABNORMAL LOW (ref 22–32)
Calcium: 7.5 mg/dL — ABNORMAL LOW (ref 8.9–10.3)
Chloride: 117 mmol/L — ABNORMAL HIGH (ref 98–111)
Creatinine, Ser: 0.62 mg/dL (ref 0.44–1.00)
GFR calc Af Amer: >60 mL/min (ref >60)
GFR calc non Af Amer: >60 mL/min (ref >60)
Glucose, Bld: 172 mg/dL — ABNORMAL HIGH (ref 70–99)
Potassium: 3.7 mmol/L (ref 3.5–5.1)
Sodium: 142 mmol/L (ref 135–145)

## 2019-11-13 LAB — HEPARIN LEVEL (UNFRACTIONATED)
Heparin Unfractionated: 0.84 IU/mL — ABNORMAL HIGH (ref 0.30–0.70)
Heparin Unfractionated: 0.21 IU/mL — ABNORMAL LOW (ref 0.30–0.70)

## 2019-11-13 LAB — CBC
HCT: 27.8 % — ABNORMAL LOW (ref 36.0–46.0)
Hemoglobin: 9 g/dL — ABNORMAL LOW (ref 12.0–15.0)
MCH: 29.8 pg (ref 26.0–34.0)
MCHC: 32.4 g/dL (ref 30.0–36.0)
MCV: 92.1 fL (ref 80.0–100.0)
Platelets: 147 10*3/uL — ABNORMAL LOW (ref 150–400)
RBC: 3.02 MIL/uL — ABNORMAL LOW (ref 3.87–5.11)
RDW: 15.3 % (ref 11.5–15.5)
WBC: 29.5 10*3/uL — ABNORMAL HIGH (ref 4.0–10.5)
nRBC: 0.7 % — ABNORMAL HIGH (ref 0.0–0.2)

## 2019-11-13 LAB — GLUCOSE, CAPILLARY
Glucose-Capillary: 135 mg/dL — ABNORMAL HIGH (ref 70–99)
Glucose-Capillary: 139 mg/dL — ABNORMAL HIGH (ref 70–99)
Glucose-Capillary: 146 mg/dL — ABNORMAL HIGH (ref 70–99)
Glucose-Capillary: 164 mg/dL — ABNORMAL HIGH (ref 70–99)
Glucose-Capillary: 173 mg/dL — ABNORMAL HIGH (ref 70–99)
Glucose-Capillary: 97 mg/dL (ref 70–99)

## 2019-11-13 LAB — VANCOMYCIN, PEAK: Vancomycin Pk: 28 ug/mL — ABNORMAL LOW (ref 30–40)

## 2019-11-13 LAB — LACTIC ACID, PLASMA: Lactic Acid, Venous: 1.7 mmol/L (ref 0.5–1.9)

## 2019-11-13 MED ORDER — PANTOPRAZOLE SODIUM 40 MG IV SOLR
40.0000 mg | Freq: Every day | INTRAVENOUS | Status: DC
  Administered 2019-11-13 – 2019-11-14 (×2): 40 mg via INTRAVENOUS
  Filled 2019-11-13 (×2): qty 40

## 2019-11-13 NOTE — Progress Notes (Addendum)
PROGRESS NOTE  Sue Schaefer JEH:[id number] DOB: 11-09-1919 DOA: 10/11/2019 PCP: Dettinger, Fransisca Kaufmann, MD  HPI/Recap of past 35 hours: 86 year old female with past medical history of dementia, hypertension and CAD who presented to the emergency room with shortness of breath on 12/30.  Patient had reportedly tested positive for Covid 3 weeks prior but had been asymptomatic at that time.  She was found to be hypoxic, hypernatremic, septic and nonverbal, with her baseline being normally interactive and talkative.  Admitted to the hospitalist service and started on IV fluids and antibiotics.  Patient was discovered by CT scan to have a pulmonary embolus secondary to an acute right lower extremity DVT and started on anticoagulation on 12/31.  An echocardiogram done to assess her pulmonary embolus noted a small mobile tricuspid valve vegetation, possibly a thrombus as well as moderate to severe mitral regurg noted.  In discussion with cardiology, they recommended treatment with IV antibiotics for 6 weeks.  Since admission, she has remained minimally responsive although she has been able to be weaned off of oxygen.  An NG tube for tube feeds has been placed.  Follow-up lactic acid level on 1/3 elevated at 2.5 and worsening white count, but both have started to improve as of 1/5 once Flagyl was added on 1/4.  Patient herself remains about the same, unresponsive.  Nursing did note overnight some coffee-ground emesis.  Hemoglobin dropped about a gram since yesterday.  Vital signs have been stable.  Assessment/Plan: Active Problems:   Dementia with behavioral disturbance (HCC)/acute metabolic encephalopathy: Persistent.  Even with correction of hyponatremia.  Likely worsened due to sepsis.  Unfortunately, no change yet.  Hematemesis: Noted coffee-ground emesis as described above.  No further episodes.  Have stopped her baby aspirin and started PPI.  Follow hemoglobin.  If her hemoglobin continues to drop, will  need to stop her heparin as well which will be difficult given her PE.    Hyperlipidemia LDL goal <130    Essential hypertension, benign: Blood pressure stable.    CAD (coronary artery disease) of artery bypass graft: Stable.  Sepsis secondary to pneumonia due to COVID-19 virus causing acute respiratory failure with hypoxia: Patient met criteria for sepsis on admission given acute metabolic encephalopathy, lactic acid level of 4.2, fever and tachycardia with pulmonary source and secondary hypoxia.  With some IV fluids and antibiotics, sepsis improved, but lactic acid still not yet normalized.  Lactic acid trending back upward and white count significantly elevated today at 32.  Allergic to penicillin, so started on Flagyl to cover for anaerobes.  Continue antibiotics, Remdisivir and steroids.  Patient initially required high flow nasal cannula to keep oxygen saturations above 90%.  Over the next few days, this was able to be weaned off completely.  Given negative MRSA, will discontinue vancomycin  Severe protein calorie malnutrition/Refeeding syndrome: Nutrition to see.  Feeding tube placed.  Received 1 dose of IV albumin.  Holding on feeding for now   Acute kidney injury in the setting of CKD (chronic kidney disease), stage III: Renal function has since stabilized.  Acute kidney injury secondary to sepsis.  Bilateral pulmonary emboli secondary to acute right lower extremity DVT: On heparin.  Hypernatremia: Secondary to dehydration from sepsis.  Looks to have since resolved although her mentation has not changed at all.  Moderate to severe mitral regurg: Monitor closely as we are aggressively rehydrating her.  Suspected endocarditis: Case discussed with infectious disease and cardiology.  Not a surgical candidate given her advanced age,  comorbidities and tenuous status.  Plan is for 6 weeks of IV antibiotics and then follow-up echocardiogram  Code Status: DNR  Family Communication: Updated  daughter by phone   Disposition Plan: Still to be determined.  We will see if her mentation improves as the infection is better stabilized.   Consultants:  None  Procedures:  None  Antimicrobials:  IV Azactam 12/30-12/31  IV Remdisivir 12/30-present  IV cefepime 12/31-present  IV vancomycin 12/30-1/5  IV Flagyl 1/4-present  DVT prophylaxis: Heparin drip   Objective: Vitals:   11/13/19 1147 11/13/19 1535  BP: (!) 133/53 128/64  Pulse: 94 90  Resp: (!) 29 (!) 23  Temp: (!) 97.2 F (36.2 C)   SpO2: 94% 96%    Intake/Output Summary (Last 24 hours) at 11/13/2019 1551 Last data filed at 11/13/2019 1030 Gross per 24 hour  Intake 2619.58 ml  Output 750 ml  Net 1869.58 ml   Filed Weights   11/10/19 0500 11/11/19 0408 11/13/19 0413  Weight: 44.7 kg 48.7 kg 47.6 kg   Body mass index is 19.19 kg/m.  Exam:   General: Emaciated, minimally responsive, does not appear to be in any acute distress.    HEENT: Normocephalic, mucous membranes quite dry  Neck: Supple, no JVD  Cardiovascular: Regular rate and rhythm, S1-S2  Respiratory: Decreased breath sounds throughout, few Rales  Abdomen: Soft, nondistended, hypoactive bowel sounds  Musculoskeletal: No clubbing or cyanosis, or edema  Skin: Poor turgor, dry skin  Psychiatry: Does not respond to voice or commands   Data Reviewed: CBC: Recent Labs  Lab 11/08/19 0605 11/09/19 0320 11/10/19 0413 11/11/19 0144 11/12/19 0700 11/13/19 0320  WBC 19.3* 15.3* 16.4* 20.2* 32.1* 29.5*  NEUTROABS 17.8* 14.1* 14.9* 17.5* 28.6*  --   HGB 11.2* 9.9* 10.1* 10.0* 10.0* 9.0*  HCT 36.6 31.6* 31.2* 31.2* 31.3* 27.8*  MCV 94.1 94.3 89.4 89.1 89.7 92.1  PLT 100* 105* 127* 143* 169 888*   Basic Metabolic Panel: Recent Labs  Lab 11/08/19 1655 11/09/19 0320 11/10/19 0413 11/11/19 0144 11/12/19 0700 11/13/19 0320  NA  --  148* 143 140 140 142  K  --  3.2* 4.0 3.6 3.7 3.7  CL  --  122* 116* 110 111 117*  CO2  --   16* 17* 17* 20* 19*  GLUCOSE  --  219* 273* 231* 231* 172*  BUN  --  46* 45* 43* 42* 33*  CREATININE  --  0.78 0.71 0.64 0.56 0.62  CALCIUM  --  7.8* 8.0* 7.8* 7.9* 7.5*  MG 2.1 2.2 2.0 2.0 2.1  --   PHOS 2.0* 1.7* 1.4* 2.3* 2.3*  --    GFR: Estimated Creatinine Clearance: 30.2 mL/min (by C-G formula based on SCr of 0.62 mg/dL). Liver Function Tests: Recent Labs  Lab 11/08/19 0605 11/09/19 0320 11/10/19 0413 11/11/19 0144 11/12/19 0700  AST 19 60* 32 22 18  ALT 15 45* 42 36 30  ALKPHOS 57 46 57 53 48  BILITOT 1.1 1.2 1.1 1.2 1.2  PROT 5.9* 5.9* 5.4* 5.2* 4.9*  ALBUMIN 2.5* 3.3* 2.7* 2.8* 2.6*   No results for input(s): LIPASE, AMYLASE in the last 168 hours. No results for input(s): AMMONIA in the last 168 hours. Coagulation Profile: Recent Labs  Lab 11/06/2019 1645  INR 1.5*   Cardiac Enzymes: No results for input(s): CKTOTAL, CKMB, CKMBINDEX, TROPONINI in the last 168 hours. BNP (last 3 results) No results for input(s): PROBNP in the last 8760 hours. HbA1C: No results for input(s):  HGBA1C in the last 72 hours. CBG: Recent Labs  Lab 11/12/19 2101 11/13/19 0038 11/13/19 0403 11/13/19 0724 11/13/19 1145  GLUCAP 195* 173* 164* 135* 139*   Lipid Profile: No results for input(s): CHOL, HDL, LDLCALC, TRIG, CHOLHDL, LDLDIRECT in the last 72 hours. Thyroid Function Tests: No results for input(s): TSH, T4TOTAL, FREET4, T3FREE, THYROIDAB in the last 72 hours. Anemia Panel: Recent Labs    11/11/19 0500 11/12/19 0700  FERRITIN 735* 393*   Urine analysis:    Component Value Date/Time   COLORURINE YELLOW 10/30/2019 1640   APPEARANCEUR HAZY (A) 10/11/2019 1640   APPEARANCEUR Clear 07/14/2018 1352   LABSPEC 1.017 10/31/2019 1640   PHURINE 5.0 10/13/2019 1640   GLUCOSEU NEGATIVE 10/29/2019 1640   HGBUR NEGATIVE 10/20/2019 1640   BILIRUBINUR NEGATIVE 10/24/2019 1640   BILIRUBINUR Negative 07/14/2018 1352   KETONESUR NEGATIVE 10/24/2019 1640   PROTEINUR NEGATIVE  10/12/2019 1640   UROBILINOGEN negative 01/06/2016 1638   NITRITE NEGATIVE 10/16/2019 1640   LEUKOCYTESUR NEGATIVE 10/11/2019 1640   Sepsis Labs: @LABRCNTIP (procalcitonin:4,lacticidven:4)  ) Recent Results (from the past 240 hour(s))  Blood Culture (routine x 2)     Status: None   Collection Time: 10/25/2019  4:18 PM   Specimen: Right Antecubital; Blood  Result Value Ref Range Status   Specimen Description RIGHT ANTECUBITAL  Final   Special Requests   Final    BOTTLES DRAWN AEROBIC AND ANAEROBIC Blood Culture adequate volume   Culture   Final    NO GROWTH 5 DAYS Performed at [facility], [address], [place], [place] [address]    Report Status 11/12/2019 FINAL  Final  Urine culture     Status: None   Collection Time: 10/16/2019  4:40 PM   Specimen: In/Out Cath Urine  Result Value Ref Range Status   Specimen Description   Final    IN/OUT CATH URINE Performed at [facility], [address], [place], [place] [address]    Special Requests   Final    NONE Performed at [facility], [address] [place], [place] [address]    Culture   Final    NO GROWTH Performed at [facility], [place] [address], [place], [place] [address]    Report Status 11/08/2019 FINAL  Final  Blood Culture (routine x 2)     Status: None   Collection Time: 11/03/2019  4:45 PM   Specimen: Left Antecubital; Blood  Result Value Ref Range Status   Specimen Description LEFT ANTECUBITAL  Final   Special Requests   Final    BOTTLES DRAWN AEROBIC AND ANAEROBIC Blood Culture adequate volume   Culture   Final    NO GROWTH 5 DAYS Performed at [facility], [address], [place],  [address]    Report Status 11/12/2019 FINAL  Final  MRSA PCR Screening     Status: None   Collection Time: 11/08/19  2:59 AM   Specimen: Nasal Mucosa; Nasopharyngeal  Result Value Ref Range Status   MRSA by PCR NEGATIVE NEGATIVE Final    Comment:        The GeneXpert MRSA Assay (FDA approved for NASAL  specimens only), is one component of a comprehensive MRSA colonization surveillance program. It is not intended to diagnose MRSA infection nor to guide or monitor treatment for MRSA infections. Performed at [facility], [place] [address] [place],  [address]       Studies: No results found.  Scheduled Meds: . vitamin C  500 mg Per Tube Daily  . atenolol  25 mg Per Tube Daily  . Chlorhexidine Gluconate Cloth  6 each Topical Daily  . dexamethasone (DECADRON) injection  6 mg Intravenous Q24H  . docusate  100 mg Per Tube BID  . donepezil  10 mg Per Tube QHS  . insulin aspart  0-9 Units Subcutaneous Q4H  . mouth rinse  15 mL Mouth Rinse BID  . memantine  10 mg Per Tube Daily  . pantoprazole (PROTONIX) IV  40 mg Intravenous Daily  . zinc sulfate  220 mg Per Tube Daily    Continuous Infusions: . ceFEPime (MAXIPIME) IV Stopped (11/13/19 1030)  . dextrose 5 % and 0.9% NaCl 100 mL/hr at 11/13/19 1342  . diltiazem (CARDIZEM) infusion 2.5 mg/hr (11/13/19 1144)  . feeding supplement (OSMOLITE 1.2 CAL) Stopped (11/11/19 0000)  . heparin 500 Units/hr (11/13/19 1007)  . metronidazole 500 mg (11/13/19 1112)  . vancomycin Stopped (11/11/19 1902)     LOS: 6 days     Annita Brod, MD [facility]  To reach me or the doctor on call, go to: [email or web address] Password [facility]  11/13/2019, 3:51 PM

## 2019-11-13 NOTE — Progress Notes (Signed)
ANTICOAGULATION CONSULT NOTE - Follow Up Consult  Pharmacy Consult for Heparin Indication: bilateral PE and RLE DVT  Patient Measurements: Height: 5\' 2"  (157.5 cm) Weight: 104 lb 15 oz (47.6 kg) IBW/kg (Calculated) : 50.1 Heparin Dosing Weight: HEPARIN DW (KG): 44.2   Vital Signs: Temp: 98.6 F (37 C) (01/05 0400) Temp Source: Axillary (01/05 0400) BP: 121/52 (01/05 0400) Pulse Rate: 93 (01/05 0400)  Labs: Recent Labs    11/11/19 0144 11/12/19 0700 11/12/19 1645 11/13/19 0320  HGB 10.0* 10.0*  --  9.0*  HCT 31.2* 31.3*  --  27.8*  PLT 143* 169  --  147*  HEPARINUNFRC  --  0.90* 0.82* 0.84*  CREATININE 0.64 0.56  --  0.62    Estimated Creatinine Clearance: 30.2 mL/min (by C-G formula based on SCr of 0.62 mg/dL).  Assessment: 42 yoF admitted on 12/30 with COVID-19 pneumonia.  She was initially started on Heparin [place] for VTE prophylaxis.  Pharmacy is now consulted to change to IV heparin for VTE. Most recent dose Heparin 5000 units [place] TID - on 12/31 at 06:00 CTa +bilateral PE, No evidence of right heart strain Dopplers: Right: acute DVT involving the right common femoral vein, SF junction, femoral vein, proximal profunda vein, popliteal vein, right posterior tibial veins, and right peroneal veins  12/31 Bleeding: RN reports IV site bled when IV was removed on 12/31, dressing was applied and bleeding stopped.  Then about an hour later it had another large bleed. Initially controlled, then bleeding recurred. Thrombipad now applied, MD aware.     11/13/19 0440  HL: 0.84 --> supra-therapeutic level  -RN states that pt has been vomiting coffee grounds since yesterday -Hb has decreased to  9.0mg /dL -stool yesterday showed no evidence of blood -Spoke with Provider on call who said to continue heparin drip    Goal of Therapy:  Heparin level 0.3-0.7 units/ml Monitor platelets by anticoagulation protocol: Yes   Plan:   Decrease heparin infusion to 500 units/hr  Re-check HL  in 6 hours  Daily heparin level and CBC    Continue to monitor H&H and platelets  Despina Pole, Pharm. D. Clinical Pharmacist 11/13/2019 5:30 AM

## 2019-11-13 NOTE — Progress Notes (Signed)
ANTICOAGULATION CONSULT NOTE - Follow Up Consult  Pharmacy Consult for Heparin Indication: bilateral PE and RLE DVT  Patient Measurements: Height: 5\' 2"  (157.5 cm) Weight: 104 lb 15 oz (47.6 kg) IBW/kg (Calculated) : 50.1 Heparin Dosing Weight: HEPARIN DW (KG): 44.2   Vital Signs: Temp: 97.2 F (36.2 C) (01/05 1147) Temp Source: Axillary (01/05 1147) BP: 128/64 (01/05 1535) Pulse Rate: 90 (01/05 1535)  Labs: Recent Labs    11/11/19 0144 11/12/19 0700 11/12/19 1645 11/13/19 0320 11/13/19 1405  HGB 10.0* 10.0*  --  9.0*  --   HCT 31.2* 31.3*  --  27.8*  --   PLT 143* 169  --  147*  --   HEPARINUNFRC  --  0.90* 0.82* 0.84* 0.21*  CREATININE 0.64 0.56  --  0.62  --     Estimated Creatinine Clearance: 30.2 mL/min (by C-G formula based on SCr of 0.62 mg/dL).  Assessment: 49 yoF admitted on 12/30 with COVID-19 pneumonia.  She was initially started on Heparin [place] for VTE prophylaxis.  Pharmacy is now consulted to change to IV heparin for VTE. Most recent dose Heparin 5000 units [place] TID - on 12/31 at 06:00 CTa +bilateral PE, No evidence of right heart strain Dopplers: Right: acute DVT involving the right common femoral vein, SF junction, femoral vein, proximal profunda vein, popliteal vein, right posterior tibial veins, and right peroneal veins  12/31 Bleeding: RN reports IV site bled when IV was removed on 12/31, dressing was applied and bleeding stopped.  Then about an hour later it had another large bleed. Initially controlled, then bleeding recurred. Thrombipad now applied, MD aware.     HL came back low this PM at 0.21. Hgb low at 9 and plt 147. We will continue to monitor for any bleeding.     Goal of Therapy:  Heparin level 0.3-0.7 units/ml Monitor platelets by anticoagulation protocol: Yes   Plan:    Increase heparin infusion to 600 units/hr  Re-check HL in 6 hours  Daily heparin level and CBC    Continue to monitor H&H and platelets  Onnie Boer, PharmD,  BCIDP, AAHIVP, CPP Infectious Disease Pharmacist 11/13/2019 3:58 PM

## 2019-11-14 DIAGNOSIS — E870 Hyperosmolality and hypernatremia: Secondary | ICD-10-CM

## 2019-11-14 DIAGNOSIS — N1832 Chronic kidney disease, stage 3b: Secondary | ICD-10-CM

## 2019-11-14 LAB — CBC
HCT: 26.2 % — ABNORMAL LOW (ref 36.0–46.0)
Hemoglobin: 8.5 g/dL — ABNORMAL LOW (ref 12.0–15.0)
MCH: 30.1 pg (ref 26.0–34.0)
MCHC: 32.4 g/dL (ref 30.0–36.0)
MCV: 92.9 fL (ref 80.0–100.0)
Platelets: 139 10*3/uL — ABNORMAL LOW (ref 150–400)
RBC: 2.82 MIL/uL — ABNORMAL LOW (ref 3.87–5.11)
RDW: 17 % — ABNORMAL HIGH (ref 11.5–15.5)
WBC: 25.2 10*3/uL — ABNORMAL HIGH (ref 4.0–10.5)
nRBC: 0.4 % — ABNORMAL HIGH (ref 0.0–0.2)

## 2019-11-14 LAB — COMPREHENSIVE METABOLIC PANEL
ALT: 23 U/L (ref 0–44)
AST: 18 U/L (ref 15–41)
Albumin: 2.4 g/dL — ABNORMAL LOW (ref 3.5–5.0)
Alkaline Phosphatase: 70 U/L (ref 38–126)
Anion gap: 9 (ref 5–15)
BUN: 24 mg/dL — ABNORMAL HIGH (ref 8–23)
CO2: 18 mmol/L — ABNORMAL LOW (ref 22–32)
Calcium: 7.5 mg/dL — ABNORMAL LOW (ref 8.9–10.3)
Chloride: 118 mmol/L — ABNORMAL HIGH (ref 98–111)
Creatinine, Ser: 0.68 mg/dL (ref 0.44–1.00)
GFR calc Af Amer: >60 mL/min (ref >60)
GFR calc non Af Amer: >60 mL/min (ref >60)
Glucose, Bld: 130 mg/dL — ABNORMAL HIGH (ref 70–99)
Potassium: 3.2 mmol/L — ABNORMAL LOW (ref 3.5–5.1)
Sodium: 145 mmol/L (ref 135–145)
Total Bilirubin: 0.8 mg/dL (ref 0.3–1.2)
Total Protein: 4.3 g/dL — ABNORMAL LOW (ref 6.5–8.1)

## 2019-11-14 LAB — CBC WITH DIFFERENTIAL/PLATELET
Abs Immature Granulocytes: 0.48 10*3/uL — ABNORMAL HIGH (ref 0.00–0.07)
Basophils Absolute: 0.1 10*3/uL (ref 0.0–0.1)
Basophils Relative: 0 %
Eosinophils Absolute: 0 10*3/uL (ref 0.0–0.5)
Eosinophils Relative: 0 %
HCT: 26.7 % — ABNORMAL LOW (ref 36.0–46.0)
Hemoglobin: 8.5 g/dL — ABNORMAL LOW (ref 12.0–15.0)
Immature Granulocytes: 2 %
Lymphocytes Relative: 3 %
Lymphs Abs: 0.7 10*3/uL (ref 0.7–4.0)
MCH: 30 pg (ref 26.0–34.0)
MCHC: 31.8 g/dL (ref 30.0–36.0)
MCV: 94.3 fL (ref 80.0–100.0)
Monocytes Absolute: 1 10*3/uL (ref 0.1–1.0)
Monocytes Relative: 4 %
Neutro Abs: 24.5 10*3/uL — ABNORMAL HIGH (ref 1.7–7.7)
Neutrophils Relative %: 91 %
Platelets: 142 10*3/uL — ABNORMAL LOW (ref 150–400)
RBC: 2.83 MIL/uL — ABNORMAL LOW (ref 3.87–5.11)
RDW: 17.6 % — ABNORMAL HIGH (ref 11.5–15.5)
WBC: 26.7 10*3/uL — ABNORMAL HIGH (ref 4.0–10.5)
nRBC: 0.4 % — ABNORMAL HIGH (ref 0.0–0.2)

## 2019-11-14 LAB — GLUCOSE, CAPILLARY
Glucose-Capillary: 103 mg/dL — ABNORMAL HIGH (ref 70–99)
Glucose-Capillary: 141 mg/dL — ABNORMAL HIGH (ref 70–99)
Glucose-Capillary: 170 mg/dL — ABNORMAL HIGH (ref 70–99)
Glucose-Capillary: 172 mg/dL — ABNORMAL HIGH (ref 70–99)
Glucose-Capillary: 182 mg/dL — ABNORMAL HIGH (ref 70–99)
Glucose-Capillary: 198 mg/dL — ABNORMAL HIGH (ref 70–99)

## 2019-11-14 LAB — PHOSPHORUS: Phosphorus: 2.4 mg/dL — ABNORMAL LOW (ref 2.5–4.6)

## 2019-11-14 LAB — HEPARIN LEVEL (UNFRACTIONATED)
Heparin Unfractionated: 0.33 IU/mL (ref 0.30–0.70)
Heparin Unfractionated: 0.16 IU/mL — ABNORMAL LOW (ref 0.30–0.70)
Heparin Unfractionated: 0.33 IU/mL (ref 0.30–0.70)
Heparin Unfractionated: 0.68 IU/mL (ref 0.30–0.70)

## 2019-11-14 LAB — FERRITIN: Ferritin: 411 ng/mL — ABNORMAL HIGH (ref 11–307)

## 2019-11-14 LAB — D-DIMER, QUANTITATIVE: D-Dimer, Quant: 10.79 ug/mL-FEU — ABNORMAL HIGH (ref 0.00–0.50)

## 2019-11-14 LAB — MAGNESIUM: Magnesium: 2 mg/dL (ref 1.7–2.4)

## 2019-11-14 LAB — C-REACTIVE PROTEIN: CRP: 0.6 mg/dL (ref <1.0)

## 2019-11-14 LAB — VANCOMYCIN, RANDOM: Vancomycin Rm: 5

## 2019-11-14 MED ORDER — PANTOPRAZOLE SODIUM 40 MG IV SOLR
40.0000 mg | Freq: Two times a day (BID) | INTRAVENOUS | Status: DC
  Administered 2019-11-14: 40 mg via INTRAVENOUS
  Filled 2019-11-14 (×2): qty 40

## 2019-11-14 MED ORDER — POTASSIUM CHLORIDE 10 MEQ/100ML IV SOLN
10.0000 meq | INTRAVENOUS | Status: AC
  Administered 2019-11-14 – 2019-11-15 (×2): 10 meq via INTRAVENOUS
  Filled 2019-11-14 (×2): qty 100

## 2019-11-14 MED ORDER — VANCOMYCIN HCL 500 MG/100ML IV SOLN
500.0000 mg | INTRAVENOUS | Status: DC
  Administered 2019-11-14: 500 mg via INTRAVENOUS
  Filled 2019-11-14 (×2): qty 100

## 2019-11-14 MED ORDER — POTASSIUM PHOSPHATES 15 MMOLE/5ML IV SOLN
30.0000 mmol | Freq: Once | INTRAVENOUS | Status: AC
  Administered 2019-11-14: 30 mmol via INTRAVENOUS
  Filled 2019-11-14: qty 10

## 2019-11-14 MED ORDER — POTASSIUM CHLORIDE 10 MEQ/100ML IV SOLN: 10.0000 meq | INTRAVENOUS | Status: DC

## 2019-11-14 NOTE — Consult Note (Signed)
[place] Nurse Consult Note: Patient receiving care in [place], patient is COVID +.  I spoke with the primary RN via telephone, Candace Cruise [phone]).  She has agreed to take photos of all wounds for placement in the chart.  Once these are received, the consult for treatment can be continued. Val Riles, RN, MSN, CWOCN, CNS-BC, pager [phone]

## 2019-11-14 NOTE — Progress Notes (Signed)
ANTICOAGULATION CONSULT NOTE - Follow Up Consult  Pharmacy Consult for Heparin Indication: bilateral PE and RLE DVT  Patient Measurements: Height: 5\' 2"  (157.5 cm) Weight: 109 lb 12.6 oz (49.8 kg) IBW/kg (Calculated) : 50.1 Heparin Dosing Weight: HEPARIN DW (KG): 44.2   Vital Signs: BP: 114/47 (01/06 1110) Pulse Rate: 87 (01/06 1600)  Labs: Recent Labs    11/12/19 0700 11/13/19 0320 11/14/19 0515 11/14/19 0524 11/14/19 0734 11/14/19 1330 11/14/19 1727  HGB 10.0* 9.0* 8.5*  --   --  8.5*  --   HCT 31.3* 27.8* 26.2*  --   --  26.7*  --   PLT 169 147* 139*  --   --  142*  --   HEPARINUNFRC 0.90* 0.84*  --  0.16* 0.33  --  0.68  CREATININE 0.56 0.62  --   --   --  0.68  --     Estimated Creatinine Clearance: 31.6 mL/min (by C-G formula based on SCr of 0.68 mg/dL).  Assessment: 66 yoF admitted on 12/30 with COVID-19 pneumonia.  She was initially started on Heparin [place] for VTE prophylaxis.  Pharmacy is now consulted to change to IV heparin for VTE. Most recent dose Heparin 5000 units [place] TID - on 12/31 at 06:00 CTa +bilateral PE, No evidence of right heart strain Dopplers: Right: acute DVT involving the right common femoral vein, SF junction, femoral vein, proximal profunda vein, popliteal vein, right posterior tibial veins, and right peroneal veins  12/31 Bleeding: RN reports IV site bled when IV was removed on 12/31, dressing was applied and bleeding stopped.  Then about an hour later it had another large bleed. Initially controlled, then bleeding recurred. Thrombipad now applied, MD aware.     1/6 PM: Heparin level now at goal at 0.68   Goal of Therapy:  Heparin level 0.3-0.7 units/ml Monitor platelets by anticoagulation protocol: Yes   Plan:   Continue heparin at 750 units / hr  Daily heparin level and CBC    Continue to monitor H&H and platelets, s/s of bleeding  Thank you Anette Guarneri, PharmD  11/14/2019 6:23 PM

## 2019-11-14 NOTE — Progress Notes (Signed)
PROGRESS NOTE    Sue Schaefer  TGY:[id number] DOB: Mar 17, 1920 DOA: 10/24/2019 PCP: Dettinger, Fransisca Kaufmann, MD   Brief Narrative:  Sue Schaefer  is a 86 y.o. female, PMHx Dementia, HTN, vitamin D deficiency HLD  Presents to the ED today with difficulty breathing.  Patient is nonverbal at the time of my exam so history is collected from the medical record.  Patient was apparently tested positive for Covid 19 3 weeks ago.  From what I gather, she was asymptomatic at that time.  She was already on Breo and dexamethasone for lung disease.  Apparently at baseline patient is talkative and interactive, but today she started to have altered mental status, shortness of breath, and hypoxia.  When patient arrived to the ER she was ill-appearing with hypoxia and tachycardia.  Sepsis was suspected and broad-spectrum antibiotics were started with aztreonam and vancomycin.  Patient's family was contacted by Dr. Sabra Heck for CODE STATUS discussion and patient was made DNR.  Patient has been nonverbal during her entire visit in the ER.  ER provider reported that she would attempt to help with position changes, but was otherwise unresponsive.  At the time of my exam patient is still nonverbal, will not open her eyes to pain or voice, but does withdraw from pain.  ED course Temperature 100, pulse 139, respiratory rate 23, blood pressure 111/55 White blood cell count 21.6 CHEM panel: Sodium 148, creatinine 1.33-baseline 1.16 LDH 177, triglycerides 131, ferritin 539, CRP 20.1, lactic acid 4.2, pro-Cal 0.21, fibrinogen 460 Blood cultures drawn Urine culture collected Chest x-ray shows left lower lobe opacity EKG shows a heart rate of 145 and A. fib with RVR QTC is 441   Subjective: 1/6 afebrile last 24 hours barely opens eyes to her name follows no commands.      Assessment & Plan:   Active Problems:   Dementia with behavioral disturbance (HCC)   Hyperlipidemia LDL goal <130   Coronary artery disease    Essential hypertension, benign   CAD (coronary artery disease) of artery bypass graft   Pneumonia due to COVID-19 virus   Refeeding syndrome   CKD (chronic kidney disease), stage III   Sepsis (HCC)   Hypernatremia   Acute metabolic encephalopathy   Bilateral pulmonary embolism (HCC)   Acute respiratory failure with hypoxia (HCC)   Severe protein-calorie malnutrition (HCC)   Covid pneumonia/acute respiratory failure with hypoxia COVID-19 Labs  Recent Labs    11/12/19 0700  DDIMER 12.35*  FERRITIN 393*  CRP 2.1*   12/30 POC SARS coronavirus positive  -Decadron 6 mg daily -Remdesivir per pharmacy protocol -12/31 Actemra x1 dose -Xopenex (SVT) -Vitamins per Covid protocol -Titrate O2 to maintain SPO2> 88% -Prone patient 16 hours/day; if cannot tolerate prone 2 to 3 hours per shift -1/3 PCXR pending  Bilateral PE Acute -Continue full dose heparin -CTA chest PE protocol reveals PE see results below  Acute DVT RIGHT leg -Multiple vessels thrombosed right leg see results below -Heparin drip  Sepsis CAP/lactic acidosis -Upon admission patient met criteria for sepsis HR> 90, RR> 20, site of infection lungs -Trend lactic acid and procalcitonin Results for Sue, Schaefer (MRN [id number]) as of 11/08/2019 15:50  Ref. Range 10/13/2019 16:45 11/06/2019 18:43 11/08/2019 00:29 11/08/2019 06:05 11/08/2019 11:40  Lactic Acid, Venous Latest Ref Range: 0.5 - 1.9 mmol/L 4.2 (HH) 4.0 (HH) 3.1 (HH) 3.0 (HH) 3.3 [facility])  Results for Sue, Schaefer (MRN [id number]) as of 11/10/2019 15:45  Ref. Range 11/04/2019 16:45 11/08/2019 06:05 11/08/2019 11:40 11/09/2019 03:20  11/10/2019 04:13  Procalcitonin Latest Units: ng/mL 0.21 0.76 0.66 0.62 0.43   Endocarditis -Phone consult with Dr. Domenic Polite cardiology concurs to continue current antibiotic treatment until blood cultures finalized. -Recommend NOT TO obtain TEE given patient's tenuous status and the fact that she is not a surgical candidate. -1/2 phone  consult with Dr. Marcelle Smiling, ID confirms we must treat for 6 weeks of antibiotics and then perform repeat echocardiogram  SVT -Most likely secondary to patient's PE, sepsis -Resolved  Hypotension -Albumin 50 g -Resolved, have decreased rate of D5W see hypernatremia  Acute metabolic encephalopathy vs Dementia -Patient currently somnolent does not follow commands.  Unsure of baseline. -Multifactorial to include Covid infection, PE, DVT, endocarditis, electrolyte abnormality. -1/2 appears to be resolving  Severe protein calorie malnutrition -NG tube placed -Tube feeding discontinued. -1/6 nutrition consult placed in order to restart tube feeding  Coffee-ground emesis  -Protonix BID  Anemia Recent Labs  Lab 11/11/19 0144 11/12/19 0700 11/13/19 0320 11/14/19 0515 11/14/19 1330  HGB 10.0* 10.0* 9.0* 8.5* 8.5*  -Stable  Hypokalemia -Potassium goal> 4 -Potassium IV 30 mEq  Hypernatremia -1/6 D5W-0.9% saline 64m/hr  Hypophosphatemia/ Refeeding syndrome -Secondary to refeeding syndrome -Potassium phosphate 30 mmol    DVT prophylaxis: Heparin drip Code Status: DNR Family Communication: 11/10/2019 spoke with LVaughan Schaefer(daughter) discussed plan of care answered all questions Disposition Plan: TBD   Consultants:  Phone consult with Dr. MDomenic Politecardiology Phone consult RTalbot GrumblingID    Procedures/Significant Events:  12/31 CTA chest PE protocol;-bilateral pulmonary emboli, most pronounced in the left lower lobe. -No evidence of right heart strain.  -There is reflux of contrast into the hepatic veins and IVC suggesting right heart dysfunction. -Small bilateral effusions. Consolidation in both lower lobes could reflect atelectasis or infiltrate/pneumonia. -CAD  -Mildly prominent mediastinal lymph nodes. Could be followed with repeat CT in 6 months. 12/31 bilateral lower extremity Doppler; Right:acute deep vein thrombosis involving the right common femoral vein, SF junction,  right femoral vein, right proximal profunda vein, right popliteal vein, right posterior tibial veins, and right peroneal veins. Right EIV thrombosed, and CIV patent. Left: Negative for DVT 12/31 Echocardiogram;Left Ventricle: EF= 50 to 55%.-Mild hypokinesis of the left ventricular, basal-mid inferior wall. The left ventricle -Left ventricular diastology could not be evaluated due to atrial fibrillation. Left ventricular diastolic function could not be evaluated. Left Atrium:  moderately dilated. Mitral Valve: Moderate to severe mitral valve regurgitation. Tricuspid Valve: regurgitation moderate. There is a small mobile tricuspid valve vegetation or possibly thrombus. . Aortic Valve:mild to moderate. A mobile vegetation or thrombus is seen on an indeterminate cusp. The AoV vegetation measures 5 mm x 5 mm.   VENTILATOR SETTINGS: Room air 1/6 SPO2; 97%  Cultures 12/30 POC SARS coronavirus positive 12/30 RIGHT AC NGTD 12/30 LEFT AC NGTD 12/30 urine negative 12/31 MRSA by PCR negative    Antimicrobials: Anti-infectives (From admission, onward)   Start     Dose/Rate Stop   11/09/19 1700  vancomycin (VANCOREADY) IVPB 500 mg/100 mL     500 mg 100 mL/hr over 60 Minutes     11/09/19 1000  remdesivir 100 mg in sodium chloride 0.9 % 100 mL IVPB     100 mg 200 mL/hr over 30 Minutes 11/13/19 0959   11/08/19 1800  ceFEPIme (MAXIPIME) 2 g in sodium chloride 0.9 % 100 mL IVPB     2 g 200 mL/hr over 30 Minutes     11/08/19 0400  remdesivir 200 mg in sodium chloride 0.9%  250 mL IVPB     200 mg 580 mL/hr over 30 Minutes 11/08/19 0401   11/08/19 0100  aztreonam (AZACTAM) injection 500 mg  Status:  Discontinued     500 mg 10/15/2019 2121   11/08/19 0100  aztreonam (AZACTAM) 0.5 g in dextrose 5 % 50 mL IVPB  Status:  Discontinued     0.5 g 100 mL/hr over 30 Minutes 11/08/19 1527   10/16/2019 1615  vancomycin (VANCOCIN) IVPB 1000 mg/200 mL premix     1,000 mg 200 mL/hr over 60 Minutes 11/02/2019 1809    10/26/2019 1615  aztreonam (AZACTAM) 2 g in sodium chloride 0.9 % 100 mL IVPB     2 g 200 mL/hr over 30 Minutes 10/31/2019 1707       Devices    LINES / TUBES:      Continuous Infusions: . ceFEPime (MAXIPIME) IV 2 g (11/14/19 0935)  . dextrose 5 % and 0.9% NaCl 100 mL/hr at 11/14/19 0701  . diltiazem (CARDIZEM) infusion 2.5 mg/hr (11/14/19 0701)  . feeding supplement (OSMOLITE 1.2 CAL) Stopped (11/11/19 0000)  . heparin 750 Units/hr (11/14/19 0825)  . metronidazole 500 mg (11/14/19 0804)  . vancomycin       Objective: Vitals:   11/14/19 0000 11/14/19 0416 11/14/19 0420 11/14/19 0809  BP: (!) 130/50 132/65  (!) 138/56  Pulse: 97 94  (!) 104  Resp: (!) 25 (!) 23    Temp: 98 F (36.7 C) 97.9 F (36.6 C)    TempSrc: Axillary Axillary    SpO2: 98% 99%    Weight:   49.8 kg   Height:        Intake/Output Summary (Last 24 hours) at 11/14/2019 1108 Last data filed at 11/14/2019 0404 Gross per 24 hour  Intake 2926.65 ml  Output 1420 ml  Net 1506.65 ml   Filed Weights   11/11/19 0408 11/13/19 0413 11/14/19 0420  Weight: 48.7 kg 47.6 kg 49.8 kg   Physical Exam:  General: Barely opens eyes to name called, does not follow commands or nod her head.  Positive  acute respiratory distress Eyes: negative scleral hemorrhage, negative anisocoria, negative icterus ENT: Negative Runny nose, negative gingival bleeding, Neck:  Negative scars, masses, torticollis, lymphadenopathy, JVD Lungs: Clear to auscultation bilaterally without wheezes or crackles Cardiovascular: Regular rate and rhythm without murmur gallop or rub normal S1 and S2 Abdomen: negative abdominal pain, nondistended, positive soft, bowel sounds, no rebound, no ascites, no appreciable mass Extremities: No significant cyanosis, clubbing, or edema bilateral lower extremities Skin: Negative rashes, lesions, ulcers Psychiatric: Unable to assess altered mental status Central nervous system: Withdraws to painful stimuli,  opens eyes when name called otherwise unresponsive           Data Reviewed: Care during the described time interval was provided by me .  I have reviewed this patient's available data, including medical history, events of note, physical examination, and all test results as part of my evaluation.   CBC: Recent Labs  Lab 11/08/19 0605 11/09/19 0320 11/10/19 0413 11/11/19 0144 11/12/19 0700 11/13/19 0320 11/14/19 0515  WBC 19.3* 15.3* 16.4* 20.2* 32.1* 29.5* 25.2*  NEUTROABS 17.8* 14.1* 14.9* 17.5* 28.6*  --   --   HGB 11.2* 9.9* 10.1* 10.0* 10.0* 9.0* 8.5*  HCT 36.6 31.6* 31.2* 31.2* 31.3* 27.8* 26.2*  MCV 94.1 94.3 89.4 89.1 89.7 92.1 92.9  PLT 100* 105* 127* 143* 169 147* 686*   Basic Metabolic Panel: Recent Labs  Lab 11/08/19 1655 11/09/19 0320  11/10/19 0413 11/11/19 0144 11/12/19 0700 11/13/19 0320  NA  --  148* 143 140 140 142  K  --  3.2* 4.0 3.6 3.7 3.7  CL  --  122* 116* 110 111 117*  CO2  --  16* 17* 17* 20* 19*  GLUCOSE  --  219* 273* 231* 231* 172*  BUN  --  46* 45* 43* 42* 33*  CREATININE  --  0.78 0.71 0.64 0.56 0.62  CALCIUM  --  7.8* 8.0* 7.8* 7.9* 7.5*  MG 2.1 2.2 2.0 2.0 2.1  --   PHOS 2.0* 1.7* 1.4* 2.3* 2.3*  --    GFR: Estimated Creatinine Clearance: 31.6 mL/min (by C-G formula based on SCr of 0.62 mg/dL). Liver Function Tests: Recent Labs  Lab 11/08/19 0605 11/09/19 0320 11/10/19 0413 11/11/19 0144 11/12/19 0700  AST 19 60* 32 22 18  ALT 15 45* 42 36 30  ALKPHOS 57 46 57 53 48  BILITOT 1.1 1.2 1.1 1.2 1.2  PROT 5.9* 5.9* 5.4* 5.2* 4.9*  ALBUMIN 2.5* 3.3* 2.7* 2.8* 2.6*   No results for input(s): LIPASE, AMYLASE in the last 168 hours. No results for input(s): AMMONIA in the last 168 hours. Coagulation Profile: Recent Labs  Lab 10/14/2019 1645  INR 1.5*   Cardiac Enzymes: No results for input(s): CKTOTAL, CKMB, CKMBINDEX, TROPONINI in the last 168 hours. BNP (last 3 results) No results for input(s): PROBNP in the last 8760  hours. HbA1C: No results for input(s): HGBA1C in the last 72 hours. CBG: Recent Labs  Lab 11/13/19 1613 11/13/19 1937 11/13/19 2348 11/14/19 0340 11/14/19 0719  GLUCAP 97 146* 182* 172* 198*   Lipid Profile: No results for input(s): CHOL, HDL, LDLCALC, TRIG, CHOLHDL, LDLDIRECT in the last 72 hours. Thyroid Function Tests: No results for input(s): TSH, T4TOTAL, FREET4, T3FREE, THYROIDAB in the last 72 hours. Anemia Panel: Recent Labs    11/12/19 0700  FERRITIN 393*   Urine analysis:    Component Value Date/Time   COLORURINE YELLOW 10/21/2019 1640   APPEARANCEUR HAZY (A) 10/10/2019 1640   APPEARANCEUR Clear 07/14/2018 1352   LABSPEC 1.017 10/30/2019 1640   PHURINE 5.0 10/20/2019 1640   GLUCOSEU NEGATIVE 10/15/2019 1640   HGBUR NEGATIVE 10/09/2019 1640   BILIRUBINUR NEGATIVE 10/20/2019 1640   BILIRUBINUR Negative 07/14/2018 1352   KETONESUR NEGATIVE 10/26/2019 1640   PROTEINUR NEGATIVE 10/24/2019 1640   UROBILINOGEN negative 01/06/2016 1638   NITRITE NEGATIVE 10/31/2019 1640   LEUKOCYTESUR NEGATIVE 11/02/2019 1640   Sepsis Labs: @LABRCNTIP (procalcitonin:4,lacticidven:4)  ) Recent Results (from the past 240 hour(s))  Blood Culture (routine x 2)     Status: None   Collection Time: 10/18/2019  4:18 PM   Specimen: Right Antecubital; Blood  Result Value Ref Range Status   Specimen Description RIGHT ANTECUBITAL  Final   Special Requests   Final    BOTTLES DRAWN AEROBIC AND ANAEROBIC Blood Culture adequate volume   Culture   Final    NO GROWTH 5 DAYS Performed at [facility], [address] [place], [place] [address]    Report Status 11/12/2019 FINAL  Final  Urine culture     Status: None   Collection Time: 10/29/2019  4:40 PM   Specimen: In/Out Cath Urine  Result Value Ref Range Status   Specimen Description   Final    IN/OUT CATH URINE Performed at [facility], [address] [place], [place] [address]    Special Requests   Final    NONE Performed at [facility], [address], [place], [place] [address]    Culture   Final    NO GROWTH Performed at [facility], [place] [address], [place], [place] [address]    Report Status 11/08/2019 FINAL  Final  Blood Culture (routine x 2)     Status: None   Collection Time: 11/06/2019  4:45 PM   Specimen: Left Antecubital; Blood  Result Value Ref Range Status   Specimen Description LEFT ANTECUBITAL  Final   Special Requests   Final    BOTTLES DRAWN AEROBIC AND ANAEROBIC Blood Culture adequate volume   Culture   Final    NO GROWTH 5 DAYS Performed at [facility], [address] [place], [place] [address]    Report Status 11/12/2019 FINAL  Final  MRSA PCR Screening     Status: None   Collection Time: 11/08/19  2:59 AM   Specimen: Nasal Mucosa; Nasopharyngeal  Result Value Ref Range Status   MRSA by PCR NEGATIVE NEGATIVE Final    Comment:        The GeneXpert MRSA Assay (FDA approved for NASAL specimens only), is one component of a comprehensive MRSA colonization surveillance program. It is not intended to diagnose MRSA infection nor to guide or monitor treatment for MRSA infections. Performed at [facility], [place] [address], [place],  [address]          Radiology Studies: No results found.      Scheduled Meds: . vitamin C  500 mg Per Tube Daily  . atenolol  25 mg Per Tube Daily  . Chlorhexidine Gluconate Cloth  6 each Topical Daily  . dexamethasone (DECADRON) injection  6 mg Intravenous Q24H  . docusate  100 mg Per Tube BID  . donepezil  10 mg Per Tube QHS  . insulin aspart  0-9 Units Subcutaneous Q4H  . mouth rinse  15 mL Mouth Rinse BID  . memantine  10 mg Per Tube Daily  . pantoprazole (PROTONIX) IV  40 mg Intravenous Daily  . zinc sulfate  220 mg Per Tube Daily   Continuous Infusions: . ceFEPime (MAXIPIME) IV 2 g (11/14/19 0935)  . dextrose 5 % and 0.9% NaCl 100 mL/hr at 11/14/19 0701  . diltiazem (CARDIZEM) infusion 2.5 mg/hr (11/14/19 0701)  .  feeding supplement (OSMOLITE 1.2 CAL) Stopped (11/11/19 0000)  . heparin 750 Units/hr (11/14/19 0825)  . metronidazole 500 mg (11/14/19 0804)  . vancomycin       LOS: 7 days   The patient is critically ill with multiple organ systems failure and requires high complexity decision making for assessment and support, frequent evaluation and titration of therapies, application of advanced monitoring technologies and extensive interpretation of multiple databases. Critical Care Time devoted to patient care services described in this note  Time spent: 40 minutes     Gavynn Duvall, Geraldo Docker, MD [facility] Pager [phone]  If 7PM-7AM, please contact night-coverage [email or web address] Password [email or web address] 11/14/2019, 11:08 AM

## 2019-11-14 NOTE — Consult Note (Addendum)
[facility] Nurse Consult Note: Consult completed remotely after review of record, including image of area. Reason for Consult: "deep tissue injury R heel" Wound type: DTPI Pressure Injury POA: Yes Measurement: To be provided by the bedside RN in the flowsheet section Wound bed: maroon, overlying tissue intact Drainage (amount, consistency, odor) none Periwound: intact Dressing procedure/placement/frequency: prevalon heel lift boots and daily monitoring. Monitor the wound area(s) for worsening of condition such as: Signs/symptoms of infection,  Increase in size,  Development of or worsening of odor, Development of pain, or increased pain at the affected locations.  Notify the medical team if any of these develop.  Thank you for the consult.  [place] nurse will not follow at this time.  Please re-consult the [facility] team if needed.  Val Riles, RN, MSN, CWOCN, CNS-BC, pager [phone]

## 2019-11-14 NOTE — Progress Notes (Signed)
ANTICOAGULATION CONSULT NOTE - Follow Up Consult  Pharmacy Consult for Heparin Indication: bilateral PE and RLE DVT  Patient Measurements: Height: 5\' 2"  (157.5 cm) Weight: 109 lb 12.6 oz (49.8 kg) IBW/kg (Calculated) : 50.1 Heparin Dosing Weight: HEPARIN DW (KG): 44.2   Vital Signs: Temp: 97.9 F (36.6 C) (01/06 0416) Temp Source: Axillary (01/06 0416) BP: 138/56 (01/06 0809) Pulse Rate: 104 (01/06 0809)  Labs: Recent Labs    11/12/19 0700 11/13/19 0320 11/13/19 1405 11/13/19 2350 11/14/19 0515 11/14/19 0524  HGB 10.0* 9.0*  --   --  8.5*  --   HCT 31.3* 27.8*  --   --  26.2*  --   PLT 169 147*  --   --  139*  --   HEPARINUNFRC 0.90* 0.84* 0.21* 0.33  --  0.16*  CREATININE 0.56 0.62  --   --   --   --     Estimated Creatinine Clearance: 31.6 mL/min (by C-G formula based on SCr of 0.62 mg/dL).  Assessment: 28 yoF admitted on 12/30 with COVID-19 pneumonia.  She was initially started on Heparin [place] for VTE prophylaxis.  Pharmacy is now consulted to change to IV heparin for VTE. Most recent dose Heparin 5000 units [place] TID - on 12/31 at 06:00 CTa +bilateral PE, No evidence of right heart strain Dopplers: Right: acute DVT involving the right common femoral vein, SF junction, femoral vein, proximal profunda vein, popliteal vein, right posterior tibial veins, and right peroneal veins  12/31 Bleeding: RN reports IV site bled when IV was removed on 12/31, dressing was applied and bleeding stopped.  Then about an hour later it had another large bleed. Initially controlled, then bleeding recurred. Thrombipad now applied, MD aware.     1/6 AM: HL below goal, decreased despite rate increase. No further bleeding reported    Goal of Therapy:  Heparin level 0.3-0.7 units/ml Monitor platelets by anticoagulation protocol: Yes   Plan:   Increase heparin infusion to 750 units/hr  Re-check HL in 8 hours  Daily heparin level and CBC    Continue to monitor H&H and platelets, s/s of  bleeding  Ulice Dash, PharmD, BCPS  11/14/2019 8:13 AM

## 2019-11-14 NOTE — Progress Notes (Signed)
Pt NG Tube feeds remain on hold since 11/10/2018 per documentation. Woods,MD made aware, awaiting dietician consult for nutrition eval and management.

## 2019-11-14 NOTE — Progress Notes (Signed)
ANTICOAGULATION CONSULT NOTE - Follow Up Consult  Pharmacy Consult for Heparin Indication: bilateral PE and RLE DVT  Patient Measurements: Height: 5\' 2"  (157.5 cm) Weight: 104 lb 15 oz (47.6 kg) IBW/kg (Calculated) : 50.1 Heparin Dosing Weight: HEPARIN DW (KG): 44.2   Vital Signs: Temp: 98 F (36.7 C) (01/06 0000) Temp Source: Axillary (01/06 0000) BP: 130/50 (01/06 0000) Pulse Rate: 97 (01/06 0000)  Labs: Recent Labs    11/11/19 0144 11/12/19 0700 11/13/19 0320 11/13/19 1405 11/13/19 2350  HGB 10.0* 10.0* 9.0*  --   --   HCT 31.2* 31.3* 27.8*  --   --   PLT 143* 169 147*  --   --   HEPARINUNFRC  --  0.90* 0.84* 0.21* 0.33  CREATININE 0.64 0.56 0.62  --   --     Estimated Creatinine Clearance: 30.2 mL/min (by C-G formula based on SCr of 0.62 mg/dL).  Assessment: 45 yoF admitted on 12/30 with COVID-19 pneumonia.  She was initially started on Heparin [place] for VTE prophylaxis.  Pharmacy is now consulted to change to IV heparin for VTE. Most recent dose Heparin 5000 units [place] TID - on 12/31 at 06:00 CTa +bilateral PE, No evidence of right heart strain Dopplers: Right: acute DVT involving the right common femoral vein, SF junction, femoral vein, proximal profunda vein, popliteal vein, right posterior tibial veins, and right peroneal veins  12/31 Bleeding: RN reports IV site bled when IV was removed on 12/31, dressing was applied and bleeding stopped.  Then about an hour later it had another large bleed. Initially controlled, then bleeding recurred. Thrombipad now applied, MD aware.     11/14/19 0100 UPDATE Heparin level: 0.33 IU/mL, within therapeutic goal range RN reports no more issues with bleeding, last episode of emesis was early yesterday am    Goal of Therapy:  Heparin level 0.3-0.7 units/ml Monitor platelets by anticoagulation protocol: Yes   Plan:   Continue heparin infusion at 600 units/hr  Re-check HL in 6-8 hours  Daily heparin level and CBC    Continue  to monitor H&H and platelets  Despina Pole, Pharm. D. Clinical Pharmacist 11/14/2019 1:25 AM

## 2019-11-14 NOTE — Progress Notes (Signed)
Pharmacy Antibiotic Note  Sue Schaefer is a 86 y.o. female admitted on 10/21/2019 with possible endocarditis.  Pharmacy has been consulted for cefepime and vanomycin dosing. Metronidazole added by MD for anaerobic coverage.   Presenting with difficulty breathing and AMS - found to be COVID + PCT 0.21>0.66>0.43, now < 0.1 Scr 0.62 (0.8), crcl 32 mL/min  Vancomycin dose @ 1759, pk 28 @ 1920, random 5 @ 0510  Peak was collected too soon after the end of the infusion ~20 minutes after, random level well below expected after only 10 hours- 2-level calculator will not provide reliable dosing due to peak collection   Plan: Increase vancomycin to 500 mg IV q 24h Expected AUC: 455 (empiric dosing) Continue Cefepime 2g IV q24h. Monitor renal fx, cx results, will plan for vancomycin levels around the 4th dose vs traditional dosing w/ trough  Height: 5\' 2"  (157.5 cm) Weight: 109 lb 12.6 oz (49.8 kg) IBW/kg (Calculated) : 50.1  Temp (24hrs), Avg:97.8 F (36.6 C), Min:97.2 F (36.2 C), Max:98 F (36.7 C)  Recent Labs  Lab 11/09/19 0320 11/09/19 0320 11/10/19 0413 11/11/19 0144 11/11/19 1910 11/12/19 0700 11/12/19 1300 11/12/19 1645 11/13/19 0320 11/13/19 1920 11/14/19 0510 11/14/19 0515  WBC 15.3*  --  16.4* 20.2*  --  32.1*  --   --  29.5*  --   --  25.2*  CREATININE 0.78  --  0.71 0.64  --  0.56  --   --  0.62  --   --   --   LATICACIDVEN  --    < >  --   --  1.2 2.4* 2.4* 2.2* 1.7  --   --   --   VANCOPEAK  --   --   --   --   --   --   --   --   --  28*  --   --   VANCORANDOM  --   --   --   --   --   --   --   --   --   --  5  --    < > = values in this interval not displayed.    Estimated Creatinine Clearance: 31.6 mL/min (by C-G formula based on SCr of 0.62 mg/dL).     Antimicrobials this admission: Vancomycin 12/30>> Aztreonam 12/30>>12/31 Remdesivir 12/31>>1/4 Actemra 12/31 Cefepime 12/31 >  Flagyl 1/4 >>  Dose adjustments this admission:  Microbiology  results: 12/30 BCx: NG 12/30 UCx: NG 12/31 MRSA PCR: negative  Thank you for allowing pharmacy to be a part of this patient's care.  Ulice Dash, PharmD, BCPS 11/14/2019 8:59 AM

## 2019-11-14 NOTE — Plan of Care (Signed)
Pt slept well during the night. Appears more alert this AM, opens eyes spontaneously at times, nods and shakes head with some questions. No signs of pain noted. Vitals stable on RA. Remains tachypneic with coarse breath sounds, NT suctioning done prn. Total assist with ADLs. Incontinent of bladder and bowel, purewick draining well. Wounds assessed (see assessment), dressings changed as needed. Heparin drip and cardizem drip maintained at current rate, no changes necessary. Repositioned regularly for pressure relief. Wound consult done for deep tissue injury on R heel, will need prevlon boots, charge RN informed. Blister on perineal area has opened, foam dressing applied. NG to intermittent suction, minimal clear to light brown output noted. No further signs of bleeding observed.  No other issues, will monitor.   Problem: Skin Integrity: Goal: Risk for impaired skin integrity will decrease Outcome: Progressing   Problem: Respiratory: Goal: Will maintain a patent airway 11/14/2019 0519 by Melvyn Neth, RN Outcome: Progressing 11/14/2019 0517 by Melvyn Neth, RN Outcome: Progressing Goal: Complications related to the disease process, condition or treatment will be avoided or minimized 11/14/2019 0519 by Melvyn Neth, RN Outcome: Progressing 11/14/2019 [place] by Melvyn Neth, RN Outcome: Progressing

## 2019-11-15 ENCOUNTER — Encounter (HOSPITAL_COMMUNITY): Payer: Self-pay | Admitting: Pharmacist

## 2019-11-15 LAB — COMPREHENSIVE METABOLIC PANEL
ALT: 26 U/L (ref 0–44)
AST: 40 U/L (ref 15–41)
Albumin: 2.9 g/dL — ABNORMAL LOW (ref 3.5–5.0)
Alkaline Phosphatase: 77 U/L (ref 38–126)
Anion gap: 12 (ref 5–15)
BUN: 19 mg/dL (ref 8–23)
CO2: 18 mmol/L — ABNORMAL LOW (ref 22–32)
Calcium: 9 mg/dL (ref 8.9–10.3)
Chloride: 105 mmol/L (ref 98–111)
Creatinine, Ser: 0.95 mg/dL (ref 0.44–1.00)
GFR calc Af Amer: 58 mL/min — ABNORMAL LOW (ref >60)
GFR calc non Af Amer: 50 mL/min — ABNORMAL LOW (ref >60)
Glucose, Bld: 286 mg/dL — ABNORMAL HIGH (ref 70–99)
Potassium: 4.2 mmol/L (ref 3.5–5.1)
Sodium: 135 mmol/L (ref 135–145)
Total Bilirubin: 0.3 mg/dL (ref 0.3–1.2)
Total Protein: 8.7 g/dL — ABNORMAL HIGH (ref 6.5–8.1)

## 2019-11-15 LAB — CBC WITH DIFFERENTIAL/PLATELET
Abs Immature Granulocytes: 0.55 10*3/uL — ABNORMAL HIGH (ref 0.00–0.07)
Basophils Absolute: 0.1 10*3/uL (ref 0.0–0.1)
Basophils Relative: 0 %
Eosinophils Absolute: 0 10*3/uL (ref 0.0–0.5)
Eosinophils Relative: 0 %
HCT: 29.2 % — ABNORMAL LOW (ref 36.0–46.0)
Hemoglobin: 9.2 g/dL — ABNORMAL LOW (ref 12.0–15.0)
Immature Granulocytes: 2 %
Lymphocytes Relative: 3 %
Lymphs Abs: 0.8 10*3/uL (ref 0.7–4.0)
MCH: 30 pg (ref 26.0–34.0)
MCHC: 31.5 g/dL (ref 30.0–36.0)
MCV: 95.1 fL (ref 80.0–100.0)
Monocytes Absolute: 1 10*3/uL (ref 0.1–1.0)
Monocytes Relative: 3 %
Neutro Abs: 26 10*3/uL — ABNORMAL HIGH (ref 1.7–7.7)
Neutrophils Relative %: 92 %
Platelets: 142 10*3/uL — ABNORMAL LOW (ref 150–400)
RBC: 3.07 MIL/uL — ABNORMAL LOW (ref 3.87–5.11)
RDW: 18.4 % — ABNORMAL HIGH (ref 11.5–15.5)
WBC: 28.4 10*3/uL — ABNORMAL HIGH (ref 4.0–10.5)
nRBC: 0.5 % — ABNORMAL HIGH (ref 0.0–0.2)

## 2019-11-15 LAB — PHOSPHORUS: Phosphorus: 2.4 mg/dL — ABNORMAL LOW (ref 2.5–4.6)

## 2019-11-15 LAB — FERRITIN: Ferritin: 508 ng/mL — ABNORMAL HIGH (ref 11–307)

## 2019-11-15 LAB — D-DIMER, QUANTITATIVE: D-Dimer, Quant: 12.11 ug/mL-FEU — ABNORMAL HIGH (ref 0.00–0.50)

## 2019-11-15 LAB — PROCALCITONIN: Procalcitonin: 0.19 ng/mL

## 2019-11-15 LAB — MAGNESIUM: Magnesium: 2.1 mg/dL (ref 1.7–2.4)

## 2019-11-15 LAB — GLUCOSE, CAPILLARY
Glucose-Capillary: 155 mg/dL — ABNORMAL HIGH (ref 70–99)
Glucose-Capillary: 184 mg/dL — ABNORMAL HIGH (ref 70–99)
Glucose-Capillary: 225 mg/dL — ABNORMAL HIGH (ref 70–99)

## 2019-11-15 LAB — HEPARIN LEVEL (UNFRACTIONATED): Heparin Unfractionated: 0.71 IU/mL — ABNORMAL HIGH (ref 0.30–0.70)

## 2019-11-15 LAB — C-REACTIVE PROTEIN: CRP: 0.6 mg/dL (ref <1.0)

## 2019-11-15 MED ORDER — POTASSIUM CHLORIDE 10 MEQ/100ML IV SOLN
10.0000 meq | Freq: Once | INTRAVENOUS | Status: AC
  Administered 2019-11-15: 10 meq via INTRAVENOUS
  Filled 2019-11-15: qty 100

## 2019-11-15 MED ORDER — SODIUM PHOSPHATES 45 MMOLE/15ML IV SOLN
20.0000 mmol | Freq: Once | INTRAVENOUS | Status: AC
  Administered 2019-11-15: 20 mmol via INTRAVENOUS
  Filled 2019-11-15: qty 6.67

## 2019-11-16 NOTE — Telephone Encounter (Signed)
This encounter was created in error - please disregard.

## 2019-12-10 NOTE — Progress Notes (Signed)
ANTICOAGULATION CONSULT NOTE - Follow Up Consult  Pharmacy Consult for Heparin Indication: bilateral PE and RLE DVT  Patient Measurements: Height: 5\' 2"  (157.5 cm) Weight: 119 lb 14.9 oz (54.4 kg) IBW/kg (Calculated) : 50.1 Heparin Dosing Weight: HEPARIN DW (KG): 44.2   Vital Signs: Temp: 97.7 F (36.5 C) (01/07 0705) Temp Source: Axillary (01/07 0705) BP: 110/52 (01/07 0705) Pulse Rate: 77 (01/07 0705)  Labs: Recent Labs    11/13/19 0320 11/14/19 0515 11/14/19 0734 11/14/19 1330 11/14/19 1727 12-12-19 0120  HGB 9.0* 8.5*  --  8.5*  --  9.2*  HCT 27.8* 26.2*  --  26.7*  --  29.2*  PLT 147* 139*  --  142*  --  142*  HEPARINUNFRC 0.84*  --  0.33  --  0.68 0.71*  CREATININE 0.62  --   --  0.68  --  0.95    Estimated Creatinine Clearance: 26.8 mL/min (by C-G formula based on SCr of 0.95 mg/dL).  Assessment: 6 yoF admitted on 12/30 with COVID-19 pneumonia.  She was initially started on Heparin [place] for VTE prophylaxis.  Pharmacy is now consulted to change to IV heparin for VTE. Most recent dose Heparin 5000 units [place] TID - on 12/31 at 06:00 CTa +bilateral PE, No evidence of right heart strain Dopplers: Right: acute DVT involving the right common femoral vein, SF junction, femoral vein, proximal profunda vein, popliteal vein, right posterior tibial veins, and right peroneal veins  12/31 Bleeding: RN reports IV site bled when IV was removed on 12/31, dressing was applied and bleeding stopped.  Then about an hour later it had another large bleed. Initially controlled, then bleeding recurred. Thrombipad applied and bleed resolved.  1/7 am, heparin at 0.71 on 750 units/hr,  We decreased rate earlier this AM to 700 units/hr as goal at time was 0.3-0.7.  Now  Dr. Sherral Hammers called pharmacist this AM , he is concerned due to coffee ground emesis, states he wants to target  minimally therapeutic HL.   I will decrease heparin rate again to target a lower goal of 0.3-0.5 units/ml   Goal of  Therapy:  Heparin level 0.3-0.5 units/ml per MD Monitor platelets by anticoagulation protocol: Yes   Plan:   Decrease heparin to 600 units / hr  F/u heparin level in 6-8 hours  Continue to monitor H&H and platelets, s/s of bleeding  Nicole Cella, RPh Clinical Pharmacist Please see amion for complete clinical pharmacist phone list December 12, 2019 8:18 AM

## 2019-12-10 NOTE — Progress Notes (Signed)
At bedside reporting this morning, it was noted that patient had pulled her NGT out.  Dark red blood was noted in the distal end of the NGT.  Heparin level this morning was 0.71 and Heparin level was decreased from 7.5 to 7.0.  Patient has more pronounced congested cough this morning.  She is more alert and awake.  Attempted to oral suction secretions but patient bit down on [place] and it had to be pried out of her mouth.   Dr. Clydene Laming paged to make aware and see if any additional orders.  Will continue to monitor patient.  Earleen Reaper RN

## 2019-12-10 NOTE — Death Summary Note (Signed)
Death Summary  Sue Schaefer ZOX:[id number] DOB: [birth year] DOA: 2019/12/05  PCP: Dettinger, Fransisca Kaufmann, MD PCP/Office notified: No  Admit date: 12-05-2019 Date of Death: 12-13-19  Final Diagnoses:  Active Problems:   Dementia with behavioral disturbance (HCC)   Hyperlipidemia LDL goal <130   Coronary artery disease   Essential hypertension, benign   CAD (coronary artery disease) of artery bypass graft   Pneumonia due to COVID-19 virus   Refeeding syndrome   CKD (chronic kidney disease), stage III   Sepsis (HCC)   Hypernatremia   Acute metabolic encephalopathy   Bilateral pulmonary embolism (HCC)   Acute respiratory failure with hypoxia (HCC)   Severe protein-calorie malnutrition (HCC)   Covid pneumonia/acute respiratory failure with hypoxia COVID-19 Labs   Dec 05, 2023 POC SARS coronavirus positive  -Decadron 6 mg daily -Remdesivir per pharmacy protocol -12/31 Actemra x1 dose -Xopenex (SVT) -Vitamins per Covid protocol -Titrate O2 to maintain SPO2> 88% -Prone patient 16 hours/day; if cannot tolerate prone 2 to 3 hours per shift   Bilateral PE Acute -Continue full dose heparin -CTA chest PE protocol reveals PE see results below  Acute DVT RIGHT leg -Multiple vessels thrombosed right leg see results below -Heparin drip  Sepsis CAP/lactic acidosis -Upon admission patient met criteria for sepsis HR> 90, RR> 20, site of infection lungs -Trend lactic acid and procalcitonin  Results for Sue Schaefer, Sue Schaefer (MRN [id number]) as of 84/08/2020 07:14  Ref. Range 11/08/2019 11:40 11/09/2019 03:20 11/10/2019 04:13 11/12/2019 07:00 2019/12/13 01:20  Procalcitonin Latest Units: ng/mL 0.66 0.62 0.43 <0.10 0.19    Endocarditis -Phone consult with Dr. Domenic Polite cardiology concurs to continue current antibiotic treatment until blood cultures finalized. -Recommend NOT TO obtain TEE given patient's tenuous status and the fact that she is not a surgical candidate. -1/2 phone consult with Dr. Marcelle Smiling,  ID confirms we must treat for 6 weeks of antibiotics and then perform repeat echocardiogram  SVT -Most likely secondary to patient's PE, sepsis -Resolved  Hypotension -Albumin 50 g -Resolved, have decreased rate of D5W see hypernatremia  Acute metabolic encephalopathy vs Dementia -Patient currently somnolent does not follow commands.  Unsure of baseline. -Multifactorial to include Covid infection, PE, DVT, endocarditis, electrolyte abnormality. -1/2 appears to be resolving  Severe protein calorie malnutrition -NG tube placed -Tube feeding discontinued. -1/6 nutrition consult placed in order to restart tube feeding  Coffee-ground emesis  -Protonix BID  Anemia Recent Labs  Lab 11/12/19 0700 11/13/19 0320 11/14/19 0515 11/14/19 1330 December 13, 2019 0120  HGB 10.0* 9.0* 8.5* 8.5* 9.2*   Hypokalemia -Potassium goal> 4 -Potassium IV 30 mEq  Hypernatremia -1/6 D5W-0.9% saline 36m/hr  Hypophosphatemia/ Refeeding syndrome -Secondary to refeeding syndrome -Sodium phosphate 20 mmol     Hospital Course:  86y.o.female,PMHx Dementia, HTN, vitamin D deficiency HLD  Presents to the ED today with difficulty breathing. Patient is nonverbal at the time of my exam so history is collected from the medical record. Patient was apparently tested positive for Covid 19 3 weeks ago. From what I gather, she was asymptomatic at that time. She was already on Breo and dexamethasone for lung disease. Apparently at baseline patient is talkative and interactive, but today she started to have altered mental status, shortness of breath, and hypoxia. When patient arrived to the ER she was ill-appearing with hypoxia and tachycardia. Sepsis was suspected and broad-spectrum antibiotics were started with aztreonam and vancomycin. Patient's family was contacted by Dr. MSabra Heckfor CODE STATUS discussion and patient was made DNR. Patient has been nonverbal during her  entire visit in the ER. ER  provider reported that she would attempt to help with position changes, but was otherwise unresponsive. At the time of my exam patient is still nonverbal, will not open her eyes to pain or voice, but does withdraw from pain.  Patient was treated for Covid pneumonia/acute respiratory failure with appropriate Covid protocol unfortunately patient never fully recovered.  This was secondary to multiple complications caused by the Covid virus to include bilateral PE, multivessel RIGHT lower extremity blood clot.  In addition patient course complicated by sepsis secondary to endocarditis.  While at bedside patient expired peacefully.    Time: 1118  Signed:   , MD [facility] [phone]      

## 2019-12-10 NOTE — Progress Notes (Signed)
ANTICOAGULATION CONSULT NOTE - Follow Up Consult  Pharmacy Consult for Heparin Indication: bilateral PE and RLE DVT  Patient Measurements: Height: 5\' 2"  (157.5 cm) Weight: 109 lb 12.6 oz (49.8 kg) IBW/kg (Calculated) : 50.1 Heparin Dosing Weight: HEPARIN DW (KG): 44.2   Vital Signs: Temp: 98 F (36.7 C) (01/07 0032) Temp Source: Axillary (01/07 0032) BP: 127/59 (01/07 0032) Pulse Rate: 76 (01/07 0032)  Labs: Recent Labs    11/12/19 0700 11/13/19 0320 11/14/19 0515 11/14/19 0734 11/14/19 1330 11/14/19 1727 December 03, 2019 0120  HGB 10.0* 9.0* 8.5*  --  8.5*  --  9.2*  HCT 31.3* 27.8* 26.2*  --  26.7*  --  29.2*  PLT 169 147* 139*  --  142*  --  142*  HEPARINUNFRC 0.90* 0.84*  --  0.33  --  0.68 0.71*  CREATININE 0.56 0.62  --   --  0.68  --   --     Estimated Creatinine Clearance: 31.6 mL/min (by C-G formula based on SCr of 0.68 mg/dL).  Assessment: 41 yoF admitted on 12/30 with COVID-19 pneumonia.  She was initially started on Heparin [place] for VTE prophylaxis.  Pharmacy is now consulted to change to IV heparin for VTE. Most recent dose Heparin 5000 units [place] TID - on 12/31 at 06:00 CTa +bilateral PE, No evidence of right heart strain Dopplers: Right: acute DVT involving the right common femoral vein, SF junction, femoral vein, proximal profunda vein, popliteal vein, right posterior tibial veins, and right peroneal veins  12/31 Bleeding: RN reports IV site bled when IV was removed on 12/31, dressing was applied and bleeding stopped.  Then about an hour later it had another large bleed. Initially controlled, then bleeding recurred. Thrombipad applied and bleed resolved.  1/7 am, heparin level slightly above goal at 0.71. No bleeding noted.   Goal of Therapy:  Heparin level 0.3-0.7 units/ml Monitor platelets by anticoagulation protocol: Yes   Plan:   Decrease heparin to 700 units / hr  F/u heparin level in 8 hours  Continue to monitor H&H and platelets, s/s of  bleeding  Sherlon Handing, PharmD, BCPS Please see amion for complete clinical pharmacist phone list Dec 03, 2019 4:25 AM

## 2019-12-10 DEATH — deceased
